# Patient Record
Sex: Male | Born: 1985 | Race: Black or African American | Hispanic: No | Marital: Married | Smoking: Former smoker
Health system: Southern US, Community
[De-identification: ages and names within clinical notes are randomized; demographics above are authoritative.]

## PROBLEM LIST (undated history)

## (undated) ENCOUNTER — Ambulatory Visit (HOSPITAL_COMMUNITY)

## (undated) DIAGNOSIS — E7800 Pure hypercholesterolemia, unspecified: Secondary | ICD-10-CM

## (undated) DIAGNOSIS — E119 Type 2 diabetes mellitus without complications: Secondary | ICD-10-CM

## (undated) DIAGNOSIS — I10 Essential (primary) hypertension: Secondary | ICD-10-CM

## (undated) DIAGNOSIS — K859 Acute pancreatitis without necrosis or infection, unspecified: Secondary | ICD-10-CM

## (undated) HISTORY — PX: WISDOM TOOTH EXTRACTION: SHX21

## (undated) HISTORY — PX: SHOULDER SURGERY: SHX246

## (undated) HISTORY — DX: Acute pancreatitis without necrosis or infection, unspecified: K85.90

## (undated) HISTORY — DX: Type 2 diabetes mellitus without complications: E11.9

## (undated) HISTORY — PX: TONSILLECTOMY: SUR1361

---

## 2017-01-01 DIAGNOSIS — E8881 Metabolic syndrome: Secondary | ICD-10-CM | POA: Insufficient documentation

## 2017-01-05 ENCOUNTER — Encounter (HOSPITAL_COMMUNITY): Payer: Self-pay | Admitting: Emergency Medicine

## 2017-01-05 ENCOUNTER — Other Ambulatory Visit: Payer: Self-pay

## 2017-01-05 DIAGNOSIS — R197 Diarrhea, unspecified: Secondary | ICD-10-CM | POA: Diagnosis not present

## 2017-01-05 DIAGNOSIS — R110 Nausea: Secondary | ICD-10-CM | POA: Diagnosis not present

## 2017-01-05 DIAGNOSIS — R6883 Chills (without fever): Secondary | ICD-10-CM | POA: Diagnosis not present

## 2017-01-05 DIAGNOSIS — I10 Essential (primary) hypertension: Secondary | ICD-10-CM | POA: Diagnosis not present

## 2017-01-05 DIAGNOSIS — R61 Generalized hyperhidrosis: Secondary | ICD-10-CM | POA: Insufficient documentation

## 2017-01-05 DIAGNOSIS — R945 Abnormal results of liver function studies: Secondary | ICD-10-CM | POA: Insufficient documentation

## 2017-01-05 DIAGNOSIS — R1033 Periumbilical pain: Secondary | ICD-10-CM | POA: Insufficient documentation

## 2017-01-05 LAB — URINALYSIS, ROUTINE W REFLEX MICROSCOPIC
BACTERIA UA: NONE SEEN
BILIRUBIN URINE: NEGATIVE
Glucose, UA: NEGATIVE mg/dL
KETONES UR: NEGATIVE mg/dL
LEUKOCYTES UA: NEGATIVE
Nitrite: NEGATIVE
PH: 5 (ref 5.0–8.0)
Protein, ur: NEGATIVE mg/dL
SPECIFIC GRAVITY, URINE: 1.024 (ref 1.005–1.030)
SQUAMOUS EPITHELIAL / LPF: NONE SEEN

## 2017-01-05 LAB — CBC
HCT: 42.7 % (ref 39.0–52.0)
HEMOGLOBIN: 14.4 g/dL (ref 13.0–17.0)
MCH: 29.2 pg (ref 26.0–34.0)
MCHC: 33.7 g/dL (ref 30.0–36.0)
MCV: 86.6 fL (ref 78.0–100.0)
Platelets: 265 10*3/uL (ref 150–400)
RBC: 4.93 MIL/uL (ref 4.22–5.81)
RDW: 13.9 % (ref 11.5–15.5)
WBC: 6.7 10*3/uL (ref 4.0–10.5)

## 2017-01-05 NOTE — ED Triage Notes (Addendum)
Pt presents to ED for assessment of mid abdominal pain, no radiation.  States it "feels like the outer lining of my stomach".  Pt denies pattern to pain, but it comes and goes.  Pt states pain started yesterday morning, c/o nausea, denies vomiting or diarrhea.  Pt states he has also been eating a lot of Romaine lettuce, and is concerned for infection.

## 2017-01-06 ENCOUNTER — Emergency Department (HOSPITAL_COMMUNITY)
Admission: EM | Admit: 2017-01-06 | Discharge: 2017-01-06 | Disposition: A | Discharge status: Home / Self Care | Payer: 59 | Attending: Emergency Medicine | Admitting: Emergency Medicine

## 2017-01-06 ENCOUNTER — Emergency Department (HOSPITAL_COMMUNITY): Payer: 59

## 2017-01-06 DIAGNOSIS — R7989 Other specified abnormal findings of blood chemistry: Secondary | ICD-10-CM

## 2017-01-06 DIAGNOSIS — R945 Abnormal results of liver function studies: Secondary | ICD-10-CM

## 2017-01-06 DIAGNOSIS — R1033 Periumbilical pain: Secondary | ICD-10-CM

## 2017-01-06 HISTORY — DX: Pure hypercholesterolemia, unspecified: E78.00

## 2017-01-06 HISTORY — DX: Essential (primary) hypertension: I10

## 2017-01-06 LAB — COMPREHENSIVE METABOLIC PANEL
ALBUMIN: 4.3 g/dL (ref 3.5–5.0)
ALK PHOS: 98 U/L (ref 38–126)
ALT: 67 U/L — AB (ref 17–63)
AST: 61 U/L — AB (ref 15–41)
Anion gap: 7 (ref 5–15)
BILIRUBIN TOTAL: 0.8 mg/dL (ref 0.3–1.2)
BUN: 12 mg/dL (ref 6–20)
CALCIUM: 9.4 mg/dL (ref 8.9–10.3)
CO2: 25 mmol/L (ref 22–32)
Chloride: 103 mmol/L (ref 101–111)
Creatinine, Ser: 1 mg/dL (ref 0.61–1.24)
GFR calc Af Amer: >60 mL/min (ref >60)
GFR calc non Af Amer: >60 mL/min (ref >60)
GLUCOSE: 133 mg/dL — AB (ref 65–99)
Potassium: 3.6 mmol/L (ref 3.5–5.1)
SODIUM: 135 mmol/L (ref 135–145)
TOTAL PROTEIN: 7.9 g/dL (ref 6.5–8.1)

## 2017-01-06 LAB — ACETAMINOPHEN LEVEL

## 2017-01-06 LAB — LIPASE, BLOOD: Lipase: 26 U/L (ref 11–51)

## 2017-01-06 MED ORDER — ONDANSETRON HCL 4 MG/2ML IJ SOLN
4.0000 mg | Freq: Once | INTRAMUSCULAR | Status: AC
  Administered 2017-01-06: 4 mg via INTRAVENOUS
  Filled 2017-01-06: qty 2

## 2017-01-06 MED ORDER — IOPAMIDOL (ISOVUE-300) INJECTION 61%
INTRAVENOUS | Status: AC
  Administered 2017-01-06: 100 mL
  Filled 2017-01-06: qty 100

## 2017-01-06 MED ORDER — DICYCLOMINE HCL 20 MG PO TABS: 20.0000 mg | ORAL_TABLET | Freq: Two times a day (BID) | ORAL | 0 refills | Status: DC

## 2017-01-06 MED ORDER — FENTANYL CITRATE (PF) 100 MCG/2ML IJ SOLN
50.0000 ug | Freq: Once | INTRAMUSCULAR | Status: AC
  Administered 2017-01-06: 50 ug via INTRAVENOUS
  Filled 2017-01-06: qty 2

## 2017-01-06 MED ORDER — ONDANSETRON 4 MG PO TBDP: 4.0000 mg | ORAL_TABLET | Freq: Three times a day (TID) | ORAL | 0 refills | Status: DC | PRN

## 2017-01-06 NOTE — ED Provider Notes (Signed)
[facility] EMERGENCY [facility]PARTMENT Provider Note   CSN: [id number] Arrival date & time: 01/05/17  2310     History   Chief Complaint Chief Complaint  Patient presents with  . Abdominal Pain    HPI Douglas Angela NevinBurke Asbridge Jr. is a 31 y.o. male.  Patient presents with a 2-day history of mid abdominal pain with nausea and decreased appetite.  Has had intermittent diarrhea as well.  No vomiting.  No chest pain or shortness of breath.  Feels like he may have had a fever yesterday because he was having chills and sweating but did not check his temperature.  Appetite has increased today and patient has had no vomiting but nausea has improved.  Has intermittent mid abdominal cramping that comes and goes.  Lasts for several minutes to hours at a time.  One episode of diarrhea yesterday.  Denies any pain with urination or hematuria.  No testicular pain.  No sick contacts or recent travel.  No recent antibiotic use.   The history is provided by the patient.  Abdominal Pain   Associated symptoms include diarrhea and nausea. Pertinent negatives include fever, vomiting, dysuria, hematuria, headaches, arthralgias and myalgias.    Past Medical History:  Diagnosis Date  . Hypercholesteremia   . Hypertension   . Prediabetes     There are no active problems to display for this patient.   Past Surgical History:  Procedure Laterality Date  . TONSILLECTOMY    . WISDOM TOOTH EXTRACTION         Home Medications    Prior to Admission medications   Not on File    Family History History reviewed. No pertinent family history.  Social History Social History   Tobacco Use  . Smoking status: Never Smoker  . Smokeless tobacco: Never Used  Substance Use Topics  . Alcohol use: No    Frequency: Never  . Drug use: No     Allergies   Patient has no allergy information on record.   Review of Systems Review of Systems  Constitutional: Positive for activity change,  appetite change, chills and diaphoresis. Negative for fatigue and fever.  HENT: Negative for congestion and rhinorrhea.   Respiratory: Negative for cough, chest tightness and shortness of breath.   Cardiovascular: Negative for chest pain.  Gastrointestinal: Positive for abdominal pain, diarrhea and nausea. Negative for vomiting.  Genitourinary: Negative for dysuria, hematuria and testicular pain.  Musculoskeletal: Negative for arthralgias and myalgias.  Skin: Negative for wound.  Neurological: Negative for dizziness, weakness and headaches.   all other systems are negative except as noted in the HPI and PMH.     Physical Exam Updated Vital Signs BP 134/89 (BP Location: Right Arm)   Pulse 91   Temp 98.7 F (37.1 C) (Oral)   Resp 18   Ht 6' (1.829 m)   Wt (!) 147.9 kg (326 lb)   SpO2 100%   BMI 44.21 kg/m   Physical Exam  Constitutional: He is oriented to person, place, and time. He appears well-developed and well-nourished. No distress.  HENT:  Head: Normocephalic and atraumatic.  Mouth/Throat: Oropharynx is clear and moist. No oropharyngeal exudate.  Eyes: Conjunctivae and EOM are normal. Pupils are equal, round, and reactive to light.  Neck: Normal range of motion. Neck supple.  No meningismus.  Cardiovascular: Normal rate, regular rhythm, normal heart sounds and intact distal pulses.  No murmur heard. Pulmonary/Chest: Effort normal and breath sounds normal. No respiratory distress.  Abdominal: Soft. There  is tenderness. There is no rebound and no guarding.  Obese Moderate TTP RLQ and periumbilical. No guarding or rebound. Upper abdomen soft and nontender  Musculoskeletal: Normal range of motion. He exhibits no edema or tenderness.  Neurological: He is alert and oriented to person, place, and time. No cranial nerve deficit. He exhibits normal muscle tone. Coordination normal.  No ataxia on finger to nose bilaterally. No pronator drift. 5/5 strength throughout. CN 2-12  intact.Equal grip strength. Sensation intact.   Skin: Skin is warm.  Psychiatric: He has a normal mood and affect. His behavior is normal.  Nursing note and vitals reviewed.    ED Treatments / Results  Labs (all labs ordered are listed, but only abnormal results are displayed) Labs Reviewed  COMPREHENSIVE METABOLIC PANEL - Abnormal; Notable for the following components:      Result Value   Glucose, Bld 133 (*)    AST 61 (*)    ALT 67 (*)    All other components within normal limits  URINALYSIS, ROUTINE W REFLEX MICROSCOPIC - Abnormal; Notable for the following components:   Hgb urine dipstick SMALL (*)    All other components within normal limits  ACETAMINOPHEN LEVEL - Abnormal; Notable for the following components:   Acetaminophen (Tylenol), Serum <10 (*)    All other components within normal limits  LIPASE, BLOOD  CBC  HEPATITIS PANEL, ACUTE    EKG  EKG Interpretation None       Radiology Ct Abdomen Pelvis W Contrast  Result Date: 01/06/2017 CLINICAL DATA:  31 year old male with mid abdominal pain. EXAM: CT ABDOMEN AND PELVIS WITH CONTRAST TECHNIQUE: Multidetector CT imaging of the abdomen and pelvis was performed using the standard protocol following bolus administration of intravenous contrast. CONTRAST:  ISOVUE-300 IOPAMIDOL (ISOVUE-300) INJECTION 61% COMPARISON:  Abdominal ultrasound dated 01/06/2017 FINDINGS: Lower chest: The visualized lung bases are clear. No intra-abdominal free air or free fluid. Hepatobiliary: There is diffuse fatty infiltration of the liver. No intrahepatic biliary ductal dilatation. The gallbladder is unremarkable. Pancreas: Unremarkable. No pancreatic ductal dilatation or surrounding inflammatory changes. Spleen: Normal in size without focal abnormality. Adrenals/Urinary Tract: Adrenal glands are unremarkable. Kidneys are normal, without renal calculi, focal lesion, or hydronephrosis. Bladder is unremarkable. Stomach/Bowel: There is no  evidence of bowel obstruction or active inflammation. Normal caliber fluid-filled loops of small bowel, likely physiologic. There is mild engorgement of the associated mesentery. Correlation with clinical exam is recommended to exclude enteritis. The appendix is normal. Vascular/Lymphatic: The abdominal aorta and IVC appear unremarkable. No portal venous gas. There is mild haziness of the mesentery with multiple top-normal lymph nodes with a "misty mesentery" appearance. This finding is nonspecific but may be related to underlying inflammatory/infectious etiology. No adenopathy. Reproductive: The prostate and seminal vesicles are grossly unremarkable. Other: None Musculoskeletal: Bilateral L5 pars defects. No listhesis. The osseous structures are otherwise intact. IMPRESSION: 1. Normal caliber fluid-filled loops of small bowel, likely physiologic. Clinical correlation is recommended to exclude enteritis. No bowel obstruction. Normal appendix. 2. Nonspecific mesenteric haziness with small shotty lymph nodes. 3. Fatty liver. Electronically Signed   By: Elgie Collard M.D.   On: 01/06/2017 03:15   US Abdomen Limited Ruq  Result Date: 01/06/2017 CLINICAL DATA:  Elevated liver function studies. EXAM: ULTRASOUND ABDOMEN LIMITED RIGHT UPPER QUADRANT COMPARISON:  None. FINDINGS: Gallbladder: No gallstones or wall thickening visualized. No sonographic Murphy sign noted by sonographer. Common bile duct: Diameter: 3.1 cm, normal Liver: Diffusely increased hepatic parenchymal echotexture likely representing fatty infiltration.  No focal lesions identified but could be obscured by the parenchymal process. Portal vein is patent on color Doppler imaging with normal direction of blood flow towards the liver. Examination is limited by patient's body habitus. IMPRESSION: No evidence of cholelithiasis or cholecystitis. Diffuse fatty infiltration of the liver. Electronically Signed   By: Burman NievesWilliam  Stevens M.D.   On: 01/06/2017  02:53    Procedures Procedures (including critical care time)  Medications Ordered in ED Medications  fentaNYL (SUBLIMAZE) injection 50 mcg (not administered)  ondansetron (ZOFRAN) injection 4 mg (not administered)     Initial Impression / Assessment and Plan / ED Course  I have reviewed the triage vital signs and the nursing notes.  Pertinent labs & imaging results that were available during my care of the patient were reviewed by me and considered in my medical decision making (see chart for details).    Patient with 2 days of abdominal cramping with nausea.  No vomiting.  Patient is well-appearing, abdomen is soft with mild right sided lower abdominal tenderness.  Labs are reassuring with negative UA.  No leukocytosis.  Minimal elevation of LFTs.  Imaging reassuring which shows normal appendix.  Patient feels improved.  We will treat supportively for likely viral gastrointestinal illness.  Discussed with patient that early appendicitis is possible though appears normal on CT at this time.  Recommend return to the ED if symptoms do not improve, becomes worse especially in the right lower quadrant or any other concerns.  Final Clinical Impressions(s) / ED Diagnoses   Final diagnoses:  Elevated LFTs  Periumbilical abdominal pain    ED Discharge Orders    None       Sheala Dosh, Jeannett SeniorStephen, MD 01/06/17 [phone]

## 2017-01-06 NOTE — Discharge Instructions (Signed)
Your testing today is reassuring.  Take the medications as prescribed.  Return to the ED if your pain becomes worse especially in the right lower quadrant of her abdomen, you develop fever, vomiting or any other concerns.

## 2017-01-06 NOTE — ED Notes (Signed)
Patient transported to Ultrasound 

## 2017-01-07 LAB — HEPATITIS PANEL, ACUTE
HEP B C IGM: NEGATIVE
HEP B S AG: NEGATIVE
Hep A IgM: NEGATIVE

## 2017-08-16 DIAGNOSIS — K859 Acute pancreatitis without necrosis or infection, unspecified: Secondary | ICD-10-CM

## 2017-08-16 HISTORY — DX: Acute pancreatitis without necrosis or infection, unspecified: K85.90

## 2017-09-15 ENCOUNTER — Encounter (HOSPITAL_COMMUNITY): Payer: Self-pay | Admitting: Emergency Medicine

## 2017-09-15 ENCOUNTER — Other Ambulatory Visit: Payer: Self-pay

## 2017-09-15 ENCOUNTER — Inpatient Hospital Stay (HOSPITAL_COMMUNITY)
Admission: EM | Admit: 2017-09-15 | Discharge: 2017-09-22 | DRG: 439 | Disposition: A | Discharge status: Home / Self Care | Payer: 59 | Attending: Internal Medicine | Admitting: Internal Medicine

## 2017-09-15 DIAGNOSIS — I10 Essential (primary) hypertension: Secondary | ICD-10-CM | POA: Diagnosis present

## 2017-09-15 DIAGNOSIS — D72829 Elevated white blood cell count, unspecified: Secondary | ICD-10-CM | POA: Diagnosis present

## 2017-09-15 DIAGNOSIS — K859 Acute pancreatitis without necrosis or infection, unspecified: Secondary | ICD-10-CM | POA: Diagnosis not present

## 2017-09-15 DIAGNOSIS — K8500 Idiopathic acute pancreatitis without necrosis or infection: Secondary | ICD-10-CM

## 2017-09-15 DIAGNOSIS — E7800 Pure hypercholesterolemia, unspecified: Secondary | ICD-10-CM | POA: Diagnosis present

## 2017-09-15 DIAGNOSIS — Z9114 Patient's other noncompliance with medication regimen: Secondary | ICD-10-CM

## 2017-09-15 DIAGNOSIS — E871 Hypo-osmolality and hyponatremia: Secondary | ICD-10-CM | POA: Diagnosis present

## 2017-09-15 DIAGNOSIS — R945 Abnormal results of liver function studies: Secondary | ICD-10-CM | POA: Diagnosis present

## 2017-09-15 DIAGNOSIS — E66813 Obesity, class 3: Secondary | ICD-10-CM | POA: Diagnosis present

## 2017-09-15 DIAGNOSIS — E781 Pure hyperglyceridemia: Secondary | ICD-10-CM | POA: Diagnosis present

## 2017-09-15 DIAGNOSIS — E876 Hypokalemia: Secondary | ICD-10-CM | POA: Diagnosis not present

## 2017-09-15 DIAGNOSIS — R1013 Epigastric pain: Secondary | ICD-10-CM

## 2017-09-15 DIAGNOSIS — R7989 Other specified abnormal findings of blood chemistry: Secondary | ICD-10-CM | POA: Diagnosis present

## 2017-09-15 DIAGNOSIS — R112 Nausea with vomiting, unspecified: Secondary | ICD-10-CM | POA: Diagnosis not present

## 2017-09-15 DIAGNOSIS — R109 Unspecified abdominal pain: Secondary | ICD-10-CM | POA: Diagnosis present

## 2017-09-15 DIAGNOSIS — E119 Type 2 diabetes mellitus without complications: Secondary | ICD-10-CM | POA: Diagnosis present

## 2017-09-15 DIAGNOSIS — Z6841 Body Mass Index (BMI) 40.0 and over, adult: Secondary | ICD-10-CM

## 2017-09-15 LAB — CBC
HEMATOCRIT: 46.3 % (ref 39.0–52.0)
Hemoglobin: 17.2 g/dL — ABNORMAL HIGH (ref 13.0–17.0)
MCH: 30.9 pg (ref 26.0–34.0)
MCHC: 37.1 g/dL — AB (ref 30.0–36.0)
MCV: 83.3 fL (ref 78.0–100.0)
Platelets: 254 10*3/uL (ref 150–400)
RBC: 5.56 MIL/uL (ref 4.22–5.81)
RDW: 13.6 % (ref 11.5–15.5)
WBC: 13.3 10*3/uL — ABNORMAL HIGH (ref 4.0–10.5)

## 2017-09-15 LAB — URINALYSIS, ROUTINE W REFLEX MICROSCOPIC
Bilirubin Urine: NEGATIVE
Glucose, UA: >=500 mg/dL — AB
KETONES UR: 5 mg/dL — AB
Leukocytes, UA: NEGATIVE
Nitrite: NEGATIVE
PROTEIN: 100 mg/dL — AB
Specific Gravity, Urine: 1.025 (ref 1.005–1.030)
pH: 5 (ref 5.0–8.0)

## 2017-09-15 MED ORDER — SODIUM CHLORIDE 0.9 % IV BOLUS (SEPSIS)
1000.0000 mL | Freq: Once | INTRAVENOUS | Status: AC
  Administered 2017-09-15: 1000 mL via INTRAVENOUS

## 2017-09-15 MED ORDER — HYDROMORPHONE HCL 1 MG/ML IJ SOLN
1.0000 mg | Freq: Once | INTRAMUSCULAR | Status: AC
  Administered 2017-09-15: 1 mg via INTRAVENOUS
  Filled 2017-09-15: qty 1

## 2017-09-15 MED ORDER — SODIUM CHLORIDE 0.9 % IV SOLN
1000.0000 mL | INTRAVENOUS | Status: DC
  Administered 2017-09-16: 1000 mL via INTRAVENOUS

## 2017-09-15 MED ORDER — FAMOTIDINE IN NACL 20-0.9 MG/50ML-% IV SOLN
20.0000 mg | Freq: Two times a day (BID) | INTRAVENOUS | Status: DC
  Administered 2017-09-15: 20 mg via INTRAVENOUS
  Filled 2017-09-15: qty 50

## 2017-09-15 MED ORDER — ONDANSETRON 4 MG PO TBDP
4.0000 mg | ORAL_TABLET | Freq: Once | ORAL | Status: AC | PRN
  Administered 2017-09-15: 4 mg via ORAL
  Filled 2017-09-15: qty 1

## 2017-09-15 NOTE — ED Triage Notes (Signed)
Pt complaining of central  abdominal pain along with a burning sensation on right side. Pt also reports nausea. Pt denies taking any medications prior to arrival.

## 2017-09-15 NOTE — ED Provider Notes (Signed)
[facility]-EMERGENCY DEPT Provider Note   CSN: [id number] Arrival date & time: 09/15/17  1927     History   Chief Complaint Chief Complaint  Patient presents with  . Abdominal Pain  . Nausea    HPI Douglas Angela NevinBurke Akter Jr. is a 32 y.o. male.  HPI Patient reports that after lunch he quite quickly developed a lot of pain in his upper abdomen.  He had eaten some stuffed pasta shells he made the day before.  Pain has intense cramping and burning quality.  He reports initially he thought it was just some stomach upset but it quickly got very severe.  He came to the emergency department.  He reports that he vomited and temporarily felt slightly better but the pain quickly returned.  No diarrhea or recent constipation.  No pain burning urgency urination.  No recent fever chills or general illness. Past Medical History:  Diagnosis Date  . Hypercholesteremia   . Hypertension   . Prediabetes     There are no active problems to display for this patient.   Past Surgical History:  Procedure Laterality Date  . TONSILLECTOMY    . WISDOM TOOTH EXTRACTION          Home Medications    Prior to Admission medications   Medication Sig Start Date End Date Taking? Authorizing Provider  fenofibrate 160 MG tablet Take 160 mg by mouth daily. 10/30/16  Yes [provider]  hydrochlorothiazide (HYDRODIURIL) 25 MG tablet Take 25 mg by mouth daily. 10/29/16  Yes [provider]  ibuprofen (ADVIL,MOTRIN) 800 MG tablet Take 800 mg by mouth daily as needed for pain. 09/24/16  Yes [provider]  rosuvastatin (CRESTOR) 20 MG tablet Take 20 mg by mouth daily. 10/30/16  Yes [provider]  dicyclomine (BENTYL) 20 MG tablet Take 1 tablet (20 mg total) by mouth 2 (two) times daily. Patient not taking: Reported on 09/15/2017 01/06/17   Glynn Octaveancour, Stephen, MD  ondansetron (ZOFRAN ODT) 4 MG disintegrating tablet Take 1 tablet (4 mg total) by mouth every 8  (eight) hours as needed for nausea or vomiting. Patient not taking: Reported on 09/15/2017 01/06/17   Glynn Octaveancour, Stephen, MD    Family History No family history on file.  Social History Social History   Tobacco Use  . Smoking status: Never Smoker  . Smokeless tobacco: Never Used  Substance Use Topics  . Alcohol use: No    Frequency: Never  . Drug use: No     Allergies   Patient has no known allergies.   Review of Systems Review of Systems  10 Systems reviewed and are negative for acute change except as noted in the HPI.  Physical Exam Updated Vital Signs BP (!) 166/109   Pulse 100   Temp 98.7 F (37.1 C) (Oral)   Resp (!) 29   Ht 6' (1.829 m)   Wt (!) 149.7 kg (330 lb)   SpO2 96%   BMI 44.76 kg/m   Physical Exam  Constitutional: He is oriented to person, place, and time.  Patient is alert and nontoxic.  No respiratory distress.  He does appear to be in severe pain.  Morbid obesity.  HENT:  Head: Normocephalic and atraumatic.  Mouth/Throat: Oropharynx is clear and moist.  Eyes: EOM are normal.  Neck: Neck supple.  Cardiovascular: Normal rate, regular rhythm, normal heart sounds and intact distal pulses.  Pulmonary/Chest: Effort normal and breath sounds normal.  Abdominal: Soft.  Moderate to severe epigastric  and right upper quadrant pain.  No guarding or rebound.  Musculoskeletal: Normal range of motion. He exhibits no edema or tenderness.  Neurological: He is alert and oriented to person, place, and time. He exhibits normal muscle tone. Coordination normal.  Skin: Skin is warm and dry.  Psychiatric: He has a normal mood and affect.     ED Treatments / Results  Labs (all labs ordered are listed, but only abnormal results are displayed) Labs Reviewed  CBC - Abnormal; Notable for the following components:      Result Value   WBC 13.3 (*)    Hemoglobin 17.2 (*)    MCHC 37.1 (*)    All other components within normal limits  URINALYSIS, ROUTINE W REFLEX  MICROSCOPIC - Abnormal; Notable for the following components:   Glucose, UA >=500 (*)    Hgb urine dipstick SMALL (*)    Ketones, ur 5 (*)    Protein, ur 100 (*)    Bacteria, UA RARE (*)    All other components within normal limits  LIPASE, BLOOD  COMPREHENSIVE METABOLIC PANEL    EKG None  Radiology No results found.  Procedures Procedures (including critical care time)  Medications Ordered in ED Medications  sodium chloride 0.9 % bolus 1,000 mL (1,000 mLs Intravenous New Bag/Given 09/15/17 2311)    Followed by  0.9 %  sodium chloride infusion (1,000 mLs Intravenous New Bag/Given 09/16/17 0016)  famotidine (PEPCID) IVPB 20 mg premix (0 mg Intravenous Stopped 09/15/17 2350)  ondansetron (ZOFRAN-ODT) disintegrating tablet 4 mg (4 mg Oral Given 09/15/17 2012)  HYDROmorphone (DILAUDID) injection 1 mg (1 mg Intravenous Given 09/15/17 2308)     Initial Impression / Assessment and Plan / ED Course  I have reviewed the triage vital signs and the nursing notes.  Pertinent labs & imaging results that were available during my care of the patient were reviewed by me and considered in my medical decision making (see chart for details).      Final Clinical Impressions(s) / ED Diagnoses   Final diagnoses:  Epigastric pain  Patient with fairly severe and acute onset of upper abdominal pain after eating this afternoon.  He reports he felt fine this morning when he got up.  Patient does have mild leukocytosis.  Pain and history are highly suggestive of gallbladder disease.  Will proceed with pain control, fluids and CT scan.  Final disposition pending results of CT scan and response to treatment.  Reviewed with Dr. Bebe Shaggy for follow-up of diagnostic testing and definitive disposition.  ED Discharge Orders    None       Douglas Barrette, MD 09/16/17 0020

## 2017-09-16 ENCOUNTER — Encounter (HOSPITAL_COMMUNITY): Payer: Self-pay

## 2017-09-16 ENCOUNTER — Emergency Department (HOSPITAL_COMMUNITY): Payer: 59

## 2017-09-16 ENCOUNTER — Observation Stay (HOSPITAL_COMMUNITY): Payer: 59

## 2017-09-16 DIAGNOSIS — R945 Abnormal results of liver function studies: Secondary | ICD-10-CM | POA: Diagnosis not present

## 2017-09-16 DIAGNOSIS — K859 Acute pancreatitis without necrosis or infection, unspecified: Secondary | ICD-10-CM | POA: Diagnosis present

## 2017-09-16 DIAGNOSIS — E7800 Pure hypercholesterolemia, unspecified: Secondary | ICD-10-CM | POA: Diagnosis present

## 2017-09-16 DIAGNOSIS — E781 Pure hyperglyceridemia: Secondary | ICD-10-CM | POA: Diagnosis present

## 2017-09-16 DIAGNOSIS — D72829 Elevated white blood cell count, unspecified: Secondary | ICD-10-CM | POA: Diagnosis present

## 2017-09-16 DIAGNOSIS — K858 Other acute pancreatitis without necrosis or infection: Secondary | ICD-10-CM | POA: Diagnosis not present

## 2017-09-16 DIAGNOSIS — R112 Nausea with vomiting, unspecified: Secondary | ICD-10-CM | POA: Diagnosis present

## 2017-09-16 DIAGNOSIS — E871 Hypo-osmolality and hyponatremia: Secondary | ICD-10-CM | POA: Diagnosis present

## 2017-09-16 DIAGNOSIS — Z6841 Body Mass Index (BMI) 40.0 and over, adult: Secondary | ICD-10-CM | POA: Diagnosis not present

## 2017-09-16 DIAGNOSIS — K851 Biliary acute pancreatitis without necrosis or infection: Secondary | ICD-10-CM

## 2017-09-16 DIAGNOSIS — R7989 Other specified abnormal findings of blood chemistry: Secondary | ICD-10-CM | POA: Diagnosis present

## 2017-09-16 DIAGNOSIS — I10 Essential (primary) hypertension: Secondary | ICD-10-CM | POA: Diagnosis present

## 2017-09-16 DIAGNOSIS — E119 Type 2 diabetes mellitus without complications: Secondary | ICD-10-CM | POA: Diagnosis present

## 2017-09-16 DIAGNOSIS — R109 Unspecified abdominal pain: Secondary | ICD-10-CM | POA: Diagnosis present

## 2017-09-16 DIAGNOSIS — E876 Hypokalemia: Secondary | ICD-10-CM | POA: Diagnosis not present

## 2017-09-16 DIAGNOSIS — Z9114 Patient's other noncompliance with medication regimen: Secondary | ICD-10-CM | POA: Diagnosis not present

## 2017-09-16 LAB — COMPREHENSIVE METABOLIC PANEL
ALBUMIN: 4.2 g/dL (ref 3.5–5.0)
ALK PHOS: 81 U/L (ref 38–126)
ALT: 72 U/L — ABNORMAL HIGH (ref 0–44)
ALT: 64 U/L — AB (ref 0–44)
ANION GAP: 14 (ref 5–15)
AST: 82 U/L — ABNORMAL HIGH (ref 15–41)
AST: 61 U/L — ABNORMAL HIGH (ref 15–41)
Albumin: 4.6 g/dL (ref 3.5–5.0)
Alkaline Phosphatase: 128 U/L — ABNORMAL HIGH (ref 38–126)
Anion gap: 12 (ref 5–15)
BUN: 14 mg/dL (ref 6–20)
BUN: 7 mg/dL (ref 6–20)
CALCIUM: 8.4 mg/dL — AB (ref 8.9–10.3)
CHLORIDE: 103 mmol/L (ref 98–111)
CHLORIDE: 102 mmol/L (ref 98–111)
CO2: 18 mmol/L — AB (ref 22–32)
CO2: 18 mmol/L — AB (ref 22–32)
CREATININE: 0.94 mg/dL (ref 0.61–1.24)
Calcium: 9.3 mg/dL (ref 8.9–10.3)
Creatinine, Ser: 0.96 mg/dL (ref 0.61–1.24)
GFR calc Af Amer: >60 mL/min (ref >60)
GFR calc non Af Amer: >60 mL/min (ref >60)
GFR calc non Af Amer: >60 mL/min (ref >60)
GLUCOSE: 247 mg/dL — AB (ref 70–99)
Glucose, Bld: 216 mg/dL — ABNORMAL HIGH (ref 70–99)
POTASSIUM: 4.8 mmol/L (ref 3.5–5.1)
Potassium: 4.5 mmol/L (ref 3.5–5.1)
SODIUM: 133 mmol/L — AB (ref 135–145)
Sodium: 134 mmol/L — ABNORMAL LOW (ref 135–145)
Total Bilirubin: 2.3 mg/dL — ABNORMAL HIGH (ref 0.3–1.2)
Total Bilirubin: 1.7 mg/dL — ABNORMAL HIGH (ref 0.3–1.2)
Total Protein: 8 g/dL (ref 6.5–8.1)
Total Protein: 7.3 g/dL (ref 6.5–8.1)

## 2017-09-16 LAB — GLUCOSE, CAPILLARY
GLUCOSE-CAPILLARY: 193 mg/dL — AB (ref 70–99)
GLUCOSE-CAPILLARY: 233 mg/dL — AB (ref 70–99)
GLUCOSE-CAPILLARY: 235 mg/dL — AB (ref 70–99)
GLUCOSE-CAPILLARY: 237 mg/dL — AB (ref 70–99)
GLUCOSE-CAPILLARY: 244 mg/dL — AB (ref 70–99)
GLUCOSE-CAPILLARY: 245 mg/dL — AB (ref 70–99)
GLUCOSE-CAPILLARY: 255 mg/dL — AB (ref 70–99)
GLUCOSE-CAPILLARY: 272 mg/dL — AB (ref 70–99)
GLUCOSE-CAPILLARY: 272 mg/dL — AB (ref 70–99)
Glucose-Capillary: 238 mg/dL — ABNORMAL HIGH (ref 70–99)
Glucose-Capillary: 254 mg/dL — ABNORMAL HIGH (ref 70–99)
Glucose-Capillary: 278 mg/dL — ABNORMAL HIGH (ref 70–99)
Glucose-Capillary: 291 mg/dL — ABNORMAL HIGH (ref 70–99)
Glucose-Capillary: 273 mg/dL — ABNORMAL HIGH (ref 70–99)
Glucose-Capillary: 260 mg/dL — ABNORMAL HIGH (ref 70–99)
Glucose-Capillary: 204 mg/dL — ABNORMAL HIGH (ref 70–99)
Glucose-Capillary: 192 mg/dL — ABNORMAL HIGH (ref 70–99)

## 2017-09-16 LAB — BASIC METABOLIC PANEL
ANION GAP: 14 (ref 5–15)
ANION GAP: 14 (ref 5–15)
BUN: 6 mg/dL (ref 6–20)
BUN: 5 mg/dL — AB (ref 6–20)
CO2: 18 mmol/L — AB (ref 22–32)
CO2: 19 mmol/L — ABNORMAL LOW (ref 22–32)
Calcium: 8 mg/dL — ABNORMAL LOW (ref 8.9–10.3)
Calcium: 8 mg/dL — ABNORMAL LOW (ref 8.9–10.3)
Chloride: 103 mmol/L (ref 98–111)
Chloride: 102 mmol/L (ref 98–111)
Creatinine, Ser: 0.86 mg/dL (ref 0.61–1.24)
Creatinine, Ser: 0.98 mg/dL (ref 0.61–1.24)
GFR calc Af Amer: >60 mL/min (ref >60)
GFR calc Af Amer: >60 mL/min (ref >60)
GFR calc non Af Amer: >60 mL/min (ref >60)
GLUCOSE: 278 mg/dL — AB (ref 70–99)
GLUCOSE: 260 mg/dL — AB (ref 70–99)
POTASSIUM: 3.9 mmol/L (ref 3.5–5.1)
POTASSIUM: 3.4 mmol/L — AB (ref 3.5–5.1)
Sodium: 135 mmol/L (ref 135–145)
Sodium: 135 mmol/L (ref 135–145)

## 2017-09-16 LAB — LIPASE, BLOOD: LIPASE: 145 U/L — AB (ref 11–51)

## 2017-09-16 LAB — LIPID PANEL
CHOLESTEROL: 777 mg/dL — AB (ref 0–200)
LDL CALC: UNDETERMINED mg/dL (ref 0–99)
VLDL: UNDETERMINED mg/dL (ref 0–40)

## 2017-09-16 LAB — CBC
HEMATOCRIT: 42 % (ref 39.0–52.0)
Hemoglobin: 15.1 g/dL (ref 13.0–17.0)
MCH: 30.4 pg (ref 26.0–34.0)
MCHC: 36 g/dL (ref 30.0–36.0)
MCV: 84.7 fL (ref 78.0–100.0)
PLATELETS: 240 10*3/uL (ref 150–400)
RBC: 4.96 MIL/uL (ref 4.22–5.81)
RDW: 13.8 % (ref 11.5–15.5)
WBC: 12.6 10*3/uL — AB (ref 4.0–10.5)

## 2017-09-16 LAB — MRSA PCR SCREENING: MRSA by PCR: NEGATIVE

## 2017-09-16 LAB — HIV ANTIBODY (ROUTINE TESTING W REFLEX): HIV Screen 4th Generation wRfx: NONREACTIVE

## 2017-09-16 LAB — TRIGLYCERIDES: Triglycerides: 4960 mg/dL — ABNORMAL HIGH (ref <150)

## 2017-09-16 LAB — HEMOGLOBIN A1C
Hgb A1c MFr Bld: 8.9 % — ABNORMAL HIGH (ref 4.8–5.6)
Mean Plasma Glucose: 208.73 mg/dL

## 2017-09-16 MED ORDER — DEXTROSE-NACL 5-0.45 % IV SOLN: INTRAVENOUS | Status: DC

## 2017-09-16 MED ORDER — IOPAMIDOL (ISOVUE-300) INJECTION 61%
INTRAVENOUS | Status: AC
  Administered 2017-09-16: 100 mL via INTRAVENOUS
  Filled 2017-09-16: qty 100

## 2017-09-16 MED ORDER — HYDROMORPHONE HCL 1 MG/ML IJ SOLN
1.0000 mg | INTRAMUSCULAR | Status: DC | PRN
  Administered 2017-09-16 – 2017-09-19 (×28): 1 mg via INTRAVENOUS
  Filled 2017-09-16 (×27): qty 1

## 2017-09-16 MED ORDER — SODIUM CHLORIDE 0.9 % IV SOLN
INTRAVENOUS | Status: DC
  Administered 2017-09-16: 08:00:00 via INTRAVENOUS

## 2017-09-16 MED ORDER — FENOFIBRATE 160 MG PO TABS
160.0000 mg | ORAL_TABLET | Freq: Every day | ORAL | Status: DC
  Administered 2017-09-16 – 2017-09-22 (×7): 160 mg via ORAL
  Filled 2017-09-16 (×7): qty 1

## 2017-09-16 MED ORDER — METOPROLOL TARTRATE 5 MG/5ML IV SOLN
5.0000 mg | INTRAVENOUS | Status: DC | PRN
  Administered 2017-09-16 (×2): 5 mg via INTRAVENOUS
  Filled 2017-09-16 (×2): qty 5

## 2017-09-16 MED ORDER — IOPAMIDOL (ISOVUE-300) INJECTION 61%
100.0000 mL | Freq: Once | INTRAVENOUS | Status: AC | PRN
  Administered 2017-09-16: 100 mL via INTRAVENOUS

## 2017-09-16 MED ORDER — SODIUM CHLORIDE 0.9 % IV SOLN
15.0000 [IU]/h | INTRAVENOUS | Status: DC
  Administered 2017-09-16 – 2017-09-18 (×9): 15 [IU]/h via INTRAVENOUS
  Administered 2017-09-19 (×2): 10 [IU]/h via INTRAVENOUS
  Administered 2017-09-20 (×2): 15 [IU]/h via INTRAVENOUS
  Filled 2017-09-16 (×22): qty 1

## 2017-09-16 MED ORDER — HYDROMORPHONE HCL 1 MG/ML IJ SOLN
INTRAMUSCULAR | Status: AC
  Filled 2017-09-16: qty 1

## 2017-09-16 MED ORDER — ENOXAPARIN SODIUM 60 MG/0.6ML ~~LOC~~ SOLN
60.0000 mg | SUBCUTANEOUS | Status: DC
  Administered 2017-09-16 – 2017-09-22 (×7): 60 mg via SUBCUTANEOUS
  Filled 2017-09-16 (×8): qty 0.6

## 2017-09-16 MED ORDER — SODIUM CHLORIDE 0.9 % IV SOLN
INTRAVENOUS | Status: DC | PRN
  Administered 2017-09-16 – 2017-09-20 (×3): 250 mL via INTRAVENOUS

## 2017-09-16 MED ORDER — ONDANSETRON HCL 4 MG PO TABS: 4.0000 mg | ORAL_TABLET | Freq: Four times a day (QID) | ORAL | Status: DC | PRN

## 2017-09-16 MED ORDER — HYDRALAZINE HCL 20 MG/ML IJ SOLN
10.0000 mg | Freq: Four times a day (QID) | INTRAMUSCULAR | Status: DC | PRN
  Administered 2017-09-16: 10 mg via INTRAVENOUS

## 2017-09-16 MED ORDER — ONDANSETRON HCL 4 MG/2ML IJ SOLN
4.0000 mg | Freq: Four times a day (QID) | INTRAMUSCULAR | Status: DC | PRN
  Administered 2017-09-16 (×2): 4 mg via INTRAVENOUS
  Filled 2017-09-16 (×2): qty 2

## 2017-09-16 MED ORDER — DEXTROSE 10 % IV SOLN
INTRAVENOUS | Status: DC
  Administered 2017-09-16: 1000 mL via INTRAVENOUS
  Administered 2017-09-16 – 2017-09-17 (×2): via INTRAVENOUS
  Filled 2017-09-16 (×3): qty 1000

## 2017-09-16 MED ORDER — OXYCODONE HCL 5 MG PO TABS
5.0000 mg | ORAL_TABLET | ORAL | Status: DC | PRN
  Administered 2017-09-16 (×2): 5 mg via ORAL
  Filled 2017-09-16 (×2): qty 1

## 2017-09-16 MED ORDER — IBUPROFEN 200 MG PO TABS
400.0000 mg | ORAL_TABLET | Freq: Once | ORAL | Status: AC
  Administered 2017-09-16: 400 mg via ORAL
  Filled 2017-09-16: qty 2

## 2017-09-16 MED ORDER — INSULIN REGULAR HUMAN 100 UNIT/ML IJ SOLN: INTRAMUSCULAR | Status: DC

## 2017-09-16 MED ORDER — DEXTROSE 50 % IV SOLN
25.0000 mL | INTRAVENOUS | Status: DC | PRN
  Administered 2017-09-18: 50 mL via INTRAVENOUS
  Filled 2017-09-16: qty 50

## 2017-09-16 MED ORDER — ATORVASTATIN CALCIUM 40 MG PO TABS
40.0000 mg | ORAL_TABLET | Freq: Every day | ORAL | Status: DC
  Administered 2017-09-16 – 2017-09-21 (×6): 40 mg via ORAL
  Filled 2017-09-16 (×6): qty 1

## 2017-09-16 MED ORDER — HYDRALAZINE HCL 20 MG/ML IJ SOLN
5.0000 mg | Freq: Four times a day (QID) | INTRAMUSCULAR | Status: DC | PRN
  Administered 2017-09-16: 5 mg via INTRAVENOUS
  Filled 2017-09-16 (×2): qty 1

## 2017-09-16 MED ORDER — FAMOTIDINE IN NACL 20-0.9 MG/50ML-% IV SOLN
20.0000 mg | Freq: Every day | INTRAVENOUS | Status: DC
  Administered 2017-09-16: 20 mg via INTRAVENOUS
  Filled 2017-09-16: qty 50

## 2017-09-16 MED ORDER — ACETAMINOPHEN 325 MG PO TABS
650.0000 mg | ORAL_TABLET | Freq: Four times a day (QID) | ORAL | Status: DC | PRN
  Administered 2017-09-16 – 2017-09-21 (×10): 650 mg via ORAL
  Filled 2017-09-16 (×10): qty 2

## 2017-09-16 MED ORDER — HYDROMORPHONE HCL 1 MG/ML IJ SOLN
1.0000 mg | Freq: Once | INTRAMUSCULAR | Status: AC
  Administered 2017-09-16: 1 mg via INTRAVENOUS

## 2017-09-16 MED ORDER — HYDROMORPHONE HCL 1 MG/ML IJ SOLN
1.0000 mg | INTRAMUSCULAR | Status: DC | PRN
  Administered 2017-09-16: 1 mg via INTRAVENOUS
  Filled 2017-09-16: qty 1

## 2017-09-16 MED ORDER — SENNOSIDES-DOCUSATE SODIUM 8.6-50 MG PO TABS: 1.0000 | ORAL_TABLET | Freq: Every evening | ORAL | Status: DC | PRN

## 2017-09-16 MED ORDER — HYDROMORPHONE HCL 1 MG/ML IJ SOLN
1.0000 mg | Freq: Once | INTRAMUSCULAR | Status: AC
  Administered 2017-09-16: 1 mg via INTRAVENOUS
  Filled 2017-09-16: qty 1

## 2017-09-16 NOTE — ED Notes (Signed)
ED TO INPATIENT HANDOFF REPORT  Name/Age/Gender Douglas [name]. 32 y.o. male  Code Status    Code Status Orders  (From admission, onward)        Start     Ordered   09/16/17 0256  Full code  Continuous     09/16/17 0255    Code Status History    This patient has a current code status but no historical code status.      Home/SNF/Other Home  Chief Complaint Abdominal Pain; Nausea  Level of Care/Admitting Diagnosis ED Disposition    ED Disposition Condition [place] [facility] Area: [facility] [[id number]]  Level of Care: Med-Surg [16]  Diagnosis: Pancreatitis [202663]  Admitting Physician: [facility], [facility] [[id number]]  Attending Physician: Hollice Gong, [facility] [[id number]]  PT Class (Do Not Modify): Observation [104]  PT Acc Code (Do Not Modify): Observation [10022]       Medical History Past Medical History:  Diagnosis Date  . Hypercholesteremia   . Hypertension   . Prediabetes     Allergies No Known Allergies  IV Location/Drains/Wounds Patient Lines/Drains/Airways Status   Active Line/Drains/Airways    Name:   Placement date:   Placement time:   Site:   Days:   Peripheral IV 09/16/17 Right Hand   09/16/17    0104    Hand   less than 1          Labs/Imaging Results for orders placed or performed during the hospital encounter of 09/15/17 (from the past 48 hour(s))  Lipase, blood     Status: Abnormal   Collection Time: 09/15/17  8:20 PM  Result Value Ref Range   Lipase 145 (H) 11 - 51 U/L    Comment: Performed at [facility] [address], [place], [place] [address]  Comprehensive metabolic panel     Status: Abnormal   Collection Time: 09/15/17  8:20 PM  Result Value Ref Range   Sodium 133 (L) 135 - 145 mmol/L    Comment: POST-ULTRACENTRIFUGATION   Potassium 4.8 3.5 - 5.1 mmol/L    Comment: SPECIMEN HEMOLYZED. HEMOLYSIS MAY AFFECT INTEGRITY OF RESULTS.   Chloride 103 98 - 111 mmol/L   CO2 18 (L)  22 - 32 mmol/L   Glucose, Bld 216 (H) 70 - 99 mg/dL   BUN 14 6 - 20 mg/dL   Creatinine, Ser 0.96 0.61 - 1.24 mg/dL   Calcium 9.3 8.9 - 10.3 mg/dL   Total Protein 8.0 6.5 - 8.1 g/dL   Albumin 4.6 3.5 - 5.0 g/dL   AST 82 (H) 15 - 41 U/L   ALT 72 (H) 0 - 44 U/L   Alkaline Phosphatase 128 (H) 38 - 126 U/L   Total Bilirubin 2.3 (H) 0.3 - 1.2 mg/dL   GFR calc non Af Amer >60 >60 mL/min   GFR calc Af Amer >60 >60 mL/min    Comment: (NOTE) The eGFR has been calculated using the CKD EPI equation. This calculation has not been validated in all clinical situations. eGFR's persistently <60 mL/min signify possible Chronic Kidney Disease.    Anion gap 12 5 - 15    Comment: Performed at [facility] [address], [place] [address]  CBC     Status: Abnormal   Collection Time: 09/15/17  8:20 PM  Result Value Ref Range   WBC 13.3 (H) 4.0 - 10.5 K/uL   RBC 5.56 4.22 - 5.81 MIL/uL   Hemoglobin 17.2 (  H) 13.0 - 17.0 g/dL   HCT 46.3 39.0 - 52.0 %   MCV 83.3 78.0 - 100.0 fL   MCH 30.9 26.0 - 34.0 pg   MCHC 37.1 (H) 30.0 - 36.0 g/dL    Comment: CORRECTED FOR LIPEMIA   RDW 13.6 11.5 - 15.5 %   Platelets 254 150 - 400 K/uL    Comment: Performed at [facility], [place] [address] [place], [place] [address]  Urinalysis, Routine w reflex microscopic     Status: Abnormal   Collection Time: 09/15/17  8:20 PM  Result Value Ref Range   Color, Urine YELLOW YELLOW   APPearance CLEAR CLEAR   Specific Gravity, Urine 1.025 1.005 - 1.030   pH 5.0 5.0 - 8.0   Glucose, UA >=500 (A) NEGATIVE mg/dL   Hgb urine dipstick SMALL (A) NEGATIVE   Bilirubin Urine NEGATIVE NEGATIVE   Ketones, ur 5 (A) NEGATIVE mg/dL   Protein, ur 100 (A) NEGATIVE mg/dL   Nitrite NEGATIVE NEGATIVE   Leukocytes, UA NEGATIVE NEGATIVE   RBC / HPF 0-5 0 - 5 RBC/hpf   WBC, UA 0-5 0 - 5 WBC/hpf   Bacteria, UA RARE (A) NONE SEEN   Squamous Epithelial / LPF 0-5 0 - 5   Mucus PRESENT    Hyaline Casts, UA  PRESENT     Comment: Performed at [facility], [place] [address] [place], [place] [address]   Ct Abdomen Pelvis W Contrast  Result Date: 09/16/2017 CLINICAL DATA:  Abdominal pain, nausea, leukocytosis, elevated lipase, microhematuria EXAM: CT ABDOMEN AND PELVIS WITH CONTRAST TECHNIQUE: Multidetector CT imaging of the abdomen and pelvis was performed using the standard protocol following bolus administration of intravenous contrast. CONTRAST:  170m ISOVUE-300 IOPAMIDOL (ISOVUE-300) INJECTION 61% COMPARISON:  01/06/2017 FINDINGS: Lower chest: Lung bases are clear. Hepatobiliary: Hepatic steatosis with focal fatty sparing along the gallbladder fossa. Gallbladder is unremarkable. No intrahepatic or extrahepatic ductal dilatation. Pancreas: Peripancreatic fluid/inflammatory changes along the pancreatic head/uncinate process. No pancreatic necrosis or ductal dilatation. No drainable fluid collection/pseudocyst. Spleen: Within normal limits. Adrenals/Urinary Tract: Adrenal glands are within normal limits. Kidneys are within normal limits.  No hydronephrosis. Bladder is within normal limits. Stomach/Bowel: Stomach is within normal limits. No evidence of bowel obstruction. Mild wall thickening with secondary inflammatory changes involving the duodenum. Appendix is not discretely visualized. Vascular/Lymphatic: No evidence of abdominal aortic aneurysm. No suspicious abdominopelvic lymphadenopathy. Reproductive: Prostate is unremarkable. Other: No abdominopelvic ascites. Musculoskeletal: Visualized osseous structures are within normal limits. IMPRESSION: Acute pancreatitis.  No drainable fluid collection/pseudocyst. Mild secondary inflammatory changes involving the duodenum. Hepatic steatosis with focal fatty sparing. Electronically Signed   By: SJulian HyM.D.   On: 09/16/2017 02:10    Pending Labs Unresulted Labs (From admission, onward)   Start     Ordered   09/23/17 0500  Creatinine,  serum  (enoxaparin (LOVENOX)    CrCl >/= 30 ml/min)  Weekly,   R    Comments:  while on enoxaparin therapy    09/16/17 0255   09/16/17 0500  Triglycerides  Tomorrow morning,   R     09/16/17 0253   09/16/17 0500  Comprehensive metabolic panel  Tomorrow morning,   R     09/16/17 0253   09/16/17 0500  Hemoglobin A1c  Tomorrow morning,   R     09/16/17 0256   09/16/17 0500  Lipid panel  Tomorrow morning,   R     09/16/17 0315   09/16/17 0254  HIV antibody (Routine Testing)  Once,   R     09/16/17 0255   09/16/17 0254  CBC  (enoxaparin (LOVENOX)    CrCl >/= 30 ml/min)  Once,   R    Comments:  Baseline for enoxaparin therapy IF NOT ALREADY DRAWN.  Notify MD if PLT < 100 K.    09/16/17 0255   09/16/17 0254  Creatinine, serum  (enoxaparin (LOVENOX)    CrCl >/= 30 ml/min)  Once,   R    Comments:  Baseline for enoxaparin therapy IF NOT ALREADY DRAWN.    09/16/17 0255      Vitals/Pain Today's Vitals   09/16/17 0119 09/16/17 0200 09/16/17 0230 09/16/17 0245  BP:  130/85 (!) 152/105   Pulse:  (!) 106 (!) 103   Resp:  16 (!) 28   Temp:      TempSrc:      SpO2:  96% 97%   Weight:      Height:      PainSc: 10-Worst pain ever   8     Isolation Precautions No active isolations  Medications Medications  sodium chloride 0.9 % bolus 1,000 mL (0 mLs Intravenous Stopped 09/16/17 0103)    Followed by  0.9 %  sodium chloride infusion (1,000 mLs Intravenous New Bag/Given 09/16/17 0016)  famotidine (PEPCID) IVPB 20 mg premix (has no administration in time range)  HYDROmorphone (DILAUDID) injection 1 mg (has no administration in time range)  hydrALAZINE (APRESOLINE) injection 5 mg (has no administration in time range)  enoxaparin (LOVENOX) injection 40 mg (has no administration in time range)  oxyCODONE (Oxy IR/ROXICODONE) immediate release tablet 5 mg (has no administration in time range)  senna-docusate (Senokot-S) tablet 1 tablet (has no administration in time range)  ondansetron (ZOFRAN)  tablet 4 mg (has no administration in time range)    Or  ondansetron (ZOFRAN) injection 4 mg (has no administration in time range)  ondansetron (ZOFRAN-ODT) disintegrating tablet 4 mg (4 mg Oral Given 09/15/17 2012)  HYDROmorphone (DILAUDID) injection 1 mg (1 mg Intravenous Given 09/15/17 2308)  iopamidol (ISOVUE-300) 61 % injection 100 mL (100 mLs Intravenous Contrast Given 09/16/17 0138)  HYDROmorphone (DILAUDID) injection 1 mg (1 mg Intravenous Given 09/16/17 0119)  HYDROmorphone (DILAUDID) injection 1 mg (1 mg Intravenous Given 09/16/17 0245)    Mobility walks

## 2017-09-16 NOTE — Progress Notes (Signed)
Patient's temp 101. Too early to give tylenol. Paged Linton FlemingsX. Blount. New order placed for ibuprofen once. Continue to monitor.

## 2017-09-16 NOTE — H&P (Signed)
History and Physical  Douglas Angela NevinBurke Horiuchi Jr. RUE:[id number] DOB: 1985-03-01 DOA: 09/15/2017   PCP: System, Pcp Not In   Patient coming from: Home   Chief Complaint: Abdominal pain nausea   HPI: Douglas Angela NevinBurke Yip Jr. is a 32 y.o. male with medical history significant for HTN, prediabetes who does not taking his medications and is being admitted for pancreatitis. He started having abdominal pain in the late morning of 7/31, burning epigastric discomfort radiating to his back. Associated nausea and vomiting. No fevers, chills, sick contacts, new medications, does not drink alcohol except occasionally. Does not smoke cigarettes.    ED Course: In the ER, he was found to have elevated lipase and CT scan revealed pancreatitis. He was given IVF and analgesics and hospitalist called for admission.  Review of Systems: Please see HPI for pertinent positives and negatives. A complete 10 system review of systems are otherwise negative.  Past Medical History:  Diagnosis Date  . Hypercholesteremia   . Hypertension   . Prediabetes    Past Surgical History:  Procedure Laterality Date  . TONSILLECTOMY    . WISDOM TOOTH EXTRACTION      Social History:  reports that he has never smoked. He has never used smokeless tobacco. He reports that he does not drink alcohol or use drugs.   No Known Allergies  No family history on file.   Prior to Admission medications   Medication Sig Start Date End Date Taking? Authorizing Provider  fenofibrate 160 MG tablet Take 160 mg by mouth daily. 10/30/16  Yes [provider]  hydrochlorothiazide (HYDRODIURIL) 25 MG tablet Take 25 mg by mouth daily. 10/29/16  Yes [provider]  ibuprofen (ADVIL,MOTRIN) 800 MG tablet Take 800 mg by mouth daily as needed for pain. 09/24/16  Yes [provider]  rosuvastatin (CRESTOR) 20 MG tablet Take 20 mg by mouth daily. 10/30/16  Yes [provider]  dicyclomine (BENTYL) 20 MG tablet Take 1  tablet (20 mg total) by mouth 2 (two) times daily. Patient not taking: Reported on 09/15/2017 01/06/17   Glynn Octaveancour, Stephen, MD  ondansetron (ZOFRAN ODT) 4 MG disintegrating tablet Take 1 tablet (4 mg total) by mouth every 8 (eight) hours as needed for nausea or vomiting. Patient not taking: Reported on 09/15/2017 01/06/17   Glynn Octaveancour, Stephen, MD    Physical Exam: BP (!) 152/105   Pulse (!) 103   Temp 98.7 F (37.1 C) (Oral)   Resp (!) 28   Ht 6' (1.829 m)   Wt (!) 149.7 kg (330 lb)   SpO2 97%   BMI 44.76 kg/m   General:  Alert, oriented, calm, in no acute distress, wife at bedside  Eyes: EOMI, clear conjuctivae, white sclerea Neck: supple, no masses, trachea mildline  Cardiovascular: RRR, no murmurs or rubs, no peripheral edema  Respiratory: clear to auscultation bilaterally, no wheezes, no crackles  Abdomen: soft, tender to deep palpation not particularly so in the RUQ, distended, normal bowel tones heard  Skin: dry, no rashes  Musculoskeletal: no joint effusions, normal range of motion  Psychiatric: appropriate affect, normal speech  Neurologic: extraocular muscles intact, clear speech, moving all extremities with intact sensorium            Labs on Admission:  Basic Metabolic Panel: Recent Labs  Lab 09/15/17 2020  NA 133*  K 4.8  CL 103  CO2 18*  GLUCOSE 216*  BUN 14  CREATININE 0.96  CALCIUM 9.3   Liver Function Tests: Recent Labs  Lab 09/15/17 2020  AST 82*  ALT 72*  ALKPHOS 128*  BILITOT 2.3*  PROT 8.0  ALBUMIN 4.6   Recent Labs  Lab 09/15/17 2020  LIPASE 145*   No results for input(s): AMMONIA in the last 168 hours. CBC: Recent Labs  Lab 09/15/17 2020  WBC 13.3*  HGB 17.2*  HCT 46.3  MCV 83.3  PLT 254   Cardiac Enzymes: No results for input(s): CKTOTAL, CKMB, CKMBINDEX, TROPONINI in the last 168 hours.  BNP (last 3 results) No results for input(s): BNP in the last 8760 hours.  ProBNP (last 3 results) No results for input(s): PROBNP in  the last 8760 hours.  CBG: No results for input(s): GLUCAP in the last 168 hours.  Radiological Exams on Admission: Ct Abdomen Pelvis W Contrast  Result Date: 09/16/2017 CLINICAL DATA:  Abdominal pain, nausea, leukocytosis, elevated lipase, microhematuria EXAM: CT ABDOMEN AND PELVIS WITH CONTRAST TECHNIQUE: Multidetector CT imaging of the abdomen and pelvis was performed using the standard protocol following bolus administration of intravenous contrast. CONTRAST:  ISOVUE-300 IOPAMIDOL (ISOVUE-300) INJECTION 61% COMPARISON:  01/06/2017 FINDINGS: Lower chest: Lung bases are clear. Hepatobiliary: Hepatic steatosis with focal fatty sparing along the gallbladder fossa. Gallbladder is unremarkable. No intrahepatic or extrahepatic ductal dilatation. Pancreas: Peripancreatic fluid/inflammatory changes along the pancreatic head/uncinate process. No pancreatic necrosis or ductal dilatation. No drainable fluid collection/pseudocyst. Spleen: Within normal limits. Adrenals/Urinary Tract: Adrenal glands are within normal limits. Kidneys are within normal limits.  No hydronephrosis. Bladder is within normal limits. Stomach/Bowel: Stomach is within normal limits. No evidence of bowel obstruction. Mild wall thickening with secondary inflammatory changes involving the duodenum. Appendix is not discretely visualized. Vascular/Lymphatic: No evidence of abdominal aortic aneurysm. No suspicious abdominopelvic lymphadenopathy. Reproductive: Prostate is unremarkable. Other: No abdominopelvic ascites. Musculoskeletal: Visualized osseous structures are within normal limits. IMPRESSION: Acute pancreatitis.  No drainable fluid collection/pseudocyst. Mild secondary inflammatory changes involving the duodenum. Hepatic steatosis with focal fatty sparing. Electronically Signed   By: Charline Bills M.D.   On: 09/16/2017 02:10    Assessment/Plan Present on Admission: . Acute pancreatitis . Abdominal pain . Leukocytosis .  Hyponatremia . Abnormal LFTs . Obesity, Class III, BMI 40-49.9 (morbid obesity) (HCC) . Hypertension . Pancreatitis  32 year old [place] American male with history of HTN, prediabetes and medication noncompliance being admitted with mild acute pancreatitis likely from biliary stone disease versus hypertrigyceridemia. - observation admission - NPO - IVF - check fasting lipid panel and TRG level - IV dilaudid for severe pain - IV hydralazine for hypertension - RUQ [place] in AM Active Problems:   Abdominal pain   Leukocytosis   Hyponatremia - mild, asymptomatic, follow with AM labs   Abnormal LFTs   Obesity, Class III, BMI 40-49.9 (morbid obesity) (HCC)   Hypertension - uncontrolled, does not take meds, counseled on risks of long term uncontrolled HTN   Pancreatitis  DVT prophylaxis: Lovenox   Code Status: FULL   Family Communication: Discussed with wife and patient in room this AM.   Disposition Plan: Home at DC.   Consults called: None   Admission status: Observation   Time spent: 34 minutes  Mir Vergie Living MD [facility] Pager [phone]  If 7PM-7AM, please contact night-coverage [email or web address] Password [email or web address]  09/16/2017, 3:15 AM

## 2017-09-16 NOTE — ED Notes (Addendum)
US Tech in the bedside

## 2017-09-16 NOTE — Progress Notes (Signed)
Lovenox per Pharmacy for DVT Prophylaxis    Pharmacy has been consulted from dosing enoxaparin (lovenox) in this patient for DVT prophylaxis.  The pharmacist has reviewed pertinent labs (Hgb _17.2__; PLT_254__), patient weight (_149__kg) and renal function (CrCl_>90__mL/min) and decided that enoxaparin _60_mg SQ Q24Hrs is appropriate for this patient.  The pharmacy department will sign off at this time.  Please reconsult pharmacy if status changes or for further issues.  Thank you  Luetta NuttingJulian Crowford Brittany Amirault, Jr PharmD, BCPS  09/16/2017, 4:42 AM

## 2017-09-16 NOTE — Progress Notes (Signed)
  PROGRESS NOTE  Patient admitted earlier this morning. See H&P. Douglas Angela NevinBurke Jacobowitz Jr. is a 32 yo with past medical history significant for essential hypertension, hypercholesterolemia, prediabetes who is not on any current medications at home who started having abdominal pain.  He admitted to burning epigastric discomfort radiating to his back, with nausea and vomiting.  He denies any alcohol use other than social use couple times monthly.  In the emergency department, he was found to have elevated lipase, CT scan positive for pancreatitis.  Right upper quadrant ultrasound was negative for gallstones.  Triglycerides over 5000, hemoglobin A1c 8.9. Transfer to stepdown unit for IV insulin to treat hypertriglyceridemia-associated pancreatitis N.p.o. IV fluid Pain control and antiemetics   Noralee StainJennifer Daria Mcmeekin, DO [facility] [email or web address] Password [email or web address] 09/16/2017, 10:41 AM

## 2017-09-16 NOTE — Progress Notes (Signed)
[facility], [place].  Pt transferred to RM 1238 in stable condition.

## 2017-09-16 NOTE — ED Provider Notes (Signed)
CT confirms acute pancreatitis.  Patient denies alcohol abuse.  No signs of cholecystitis by CT imaging.  Due to intractable pain will get admitted.  Discussed with hospitalist for admission   Douglas [name], DoZadie Rhinenald, MD 09/16/17 [phone]

## 2017-09-16 NOTE — Progress Notes (Signed)
Upon assessment this am, RN witnessed pt vomit three times, clear emesis after ambulating to the bathroom.  Pt reporting 8/10 pain.  Zofran and dilaudid given.Order placed by MD for transfer.  Vitals taken, see flowsheet. RN will monitor.

## 2017-09-17 LAB — GLUCOSE, CAPILLARY
GLUCOSE-CAPILLARY: 156 mg/dL — AB (ref 70–99)
GLUCOSE-CAPILLARY: 235 mg/dL — AB (ref 70–99)
GLUCOSE-CAPILLARY: 244 mg/dL — AB (ref 70–99)
GLUCOSE-CAPILLARY: 237 mg/dL — AB (ref 70–99)
GLUCOSE-CAPILLARY: 210 mg/dL — AB (ref 70–99)
GLUCOSE-CAPILLARY: 247 mg/dL — AB (ref 70–99)
Glucose-Capillary: 170 mg/dL — ABNORMAL HIGH (ref 70–99)
Glucose-Capillary: 166 mg/dL — ABNORMAL HIGH (ref 70–99)
Glucose-Capillary: 175 mg/dL — ABNORMAL HIGH (ref 70–99)
Glucose-Capillary: 156 mg/dL — ABNORMAL HIGH (ref 70–99)
Glucose-Capillary: 232 mg/dL — ABNORMAL HIGH (ref 70–99)
Glucose-Capillary: 203 mg/dL — ABNORMAL HIGH (ref 70–99)
Glucose-Capillary: 242 mg/dL — ABNORMAL HIGH (ref 70–99)
Glucose-Capillary: 217 mg/dL — ABNORMAL HIGH (ref 70–99)
Glucose-Capillary: 237 mg/dL — ABNORMAL HIGH (ref 70–99)
Glucose-Capillary: 242 mg/dL — ABNORMAL HIGH (ref 70–99)
Glucose-Capillary: 244 mg/dL — ABNORMAL HIGH (ref 70–99)
Glucose-Capillary: 212 mg/dL — ABNORMAL HIGH (ref 70–99)

## 2017-09-17 LAB — BASIC METABOLIC PANEL
ANION GAP: 16 — AB (ref 5–15)
ANION GAP: 12 (ref 5–15)
ANION GAP: 10 (ref 5–15)
Anion gap: 13 (ref 5–15)
BUN: 5 mg/dL — ABNORMAL LOW (ref 6–20)
BUN: 7 mg/dL (ref 6–20)
BUN: 10 mg/dL (ref 6–20)
BUN: 10 mg/dL (ref 6–20)
CALCIUM: 7.2 mg/dL — AB (ref 8.9–10.3)
CHLORIDE: 101 mmol/L (ref 98–111)
CO2: 19 mmol/L — ABNORMAL LOW (ref 22–32)
CO2: 18 mmol/L — ABNORMAL LOW (ref 22–32)
CO2: 21 mmol/L — ABNORMAL LOW (ref 22–32)
CO2: 22 mmol/L (ref 22–32)
CREATININE: 1.07 mg/dL (ref 0.61–1.24)
CREATININE: 1.17 mg/dL (ref 0.61–1.24)
Calcium: 7.7 mg/dL — ABNORMAL LOW (ref 8.9–10.3)
Calcium: 7.5 mg/dL — ABNORMAL LOW (ref 8.9–10.3)
Calcium: 7.4 mg/dL — ABNORMAL LOW (ref 8.9–10.3)
Chloride: 99 mmol/L (ref 98–111)
Chloride: 98 mmol/L (ref 98–111)
Chloride: 97 mmol/L — ABNORMAL LOW (ref 98–111)
Creatinine, Ser: 1.01 mg/dL (ref 0.61–1.24)
Creatinine, Ser: 1.16 mg/dL (ref 0.61–1.24)
GFR calc Af Amer: >60 mL/min (ref >60)
GFR calc Af Amer: >60 mL/min (ref >60)
GFR calc Af Amer: >60 mL/min (ref >60)
GFR calc non Af Amer: >60 mL/min (ref >60)
GFR calc non Af Amer: >60 mL/min (ref >60)
GFR calc non Af Amer: >60 mL/min (ref >60)
GFR calc non Af Amer: >60 mL/min (ref >60)
GLUCOSE: 156 mg/dL — AB (ref 70–99)
GLUCOSE: 213 mg/dL — AB (ref 70–99)
Glucose, Bld: 233 mg/dL — ABNORMAL HIGH (ref 70–99)
Glucose, Bld: 193 mg/dL — ABNORMAL HIGH (ref 70–99)
POTASSIUM: 4.5 mmol/L (ref 3.5–5.1)
Potassium: 3.5 mmol/L (ref 3.5–5.1)
Potassium: 4.3 mmol/L (ref 3.5–5.1)
Potassium: 4.1 mmol/L (ref 3.5–5.1)
SODIUM: 133 mmol/L — AB (ref 135–145)
Sodium: 133 mmol/L — ABNORMAL LOW (ref 135–145)
Sodium: 131 mmol/L — ABNORMAL LOW (ref 135–145)
Sodium: 129 mmol/L — ABNORMAL LOW (ref 135–145)

## 2017-09-17 LAB — CBC
HCT: 47.6 % (ref 39.0–52.0)
Hemoglobin: 16.6 g/dL (ref 13.0–17.0)
MCH: 30.2 pg (ref 26.0–34.0)
MCHC: 34.9 g/dL (ref 30.0–36.0)
MCV: 86.5 fL (ref 78.0–100.0)
Platelets: 245 10*3/uL (ref 150–400)
RBC: 5.5 MIL/uL (ref 4.22–5.81)
RDW: 14.7 % (ref 11.5–15.5)
WBC: 12.2 10*3/uL — ABNORMAL HIGH (ref 4.0–10.5)

## 2017-09-17 LAB — TRIGLYCERIDES
TRIGLYCERIDES: 2391 mg/dL — AB (ref <150)
Triglycerides: 2100 mg/dL — ABNORMAL HIGH (ref <150)

## 2017-09-17 MED ORDER — LIVING WELL WITH DIABETES BOOK
Freq: Once | Status: AC
  Administered 2017-09-17: 18:00:00
  Filled 2017-09-17 (×2): qty 1

## 2017-09-17 MED ORDER — HYDRALAZINE HCL 20 MG/ML IJ SOLN
10.0000 mg | Freq: Four times a day (QID) | INTRAMUSCULAR | Status: DC | PRN
  Administered 2017-09-18 – 2017-09-21 (×5): 10 mg via INTRAVENOUS
  Filled 2017-09-17 (×5): qty 1

## 2017-09-17 MED ORDER — SODIUM CHLORIDE 4 MEQ/ML IV SOLN
INTRAVENOUS | Status: DC
  Filled 2017-09-17 (×2): qty 980.75

## 2017-09-17 MED ORDER — FAMOTIDINE 20 MG PO TABS
20.0000 mg | ORAL_TABLET | Freq: Once | ORAL | Status: AC
  Administered 2017-09-17: 20 mg via ORAL
  Filled 2017-09-17: qty 1

## 2017-09-17 MED ORDER — METOPROLOL TARTRATE 5 MG/5ML IV SOLN
5.0000 mg | INTRAVENOUS | Status: AC | PRN
  Administered 2017-09-18 (×2): 5 mg via INTRAVENOUS
  Filled 2017-09-17 (×2): qty 5

## 2017-09-17 MED ORDER — SODIUM CHLORIDE 4 MEQ/ML IV SOLN
INTRAVENOUS | Status: DC
  Administered 2017-09-17 – 2017-09-21 (×13): via INTRAVENOUS
  Filled 2017-09-17 (×22): qty 980.75

## 2017-09-17 MED ORDER — FENTANYL CITRATE (PF) 100 MCG/2ML IJ SOLN
25.0000 ug | INTRAMUSCULAR | Status: AC | PRN
  Administered 2017-09-17 – 2017-09-18 (×2): 25 ug via INTRAVENOUS
  Filled 2017-09-17 (×2): qty 2

## 2017-09-17 NOTE — Progress Notes (Addendum)
PROGRESS NOTE    Douglas Angela NevinBurke Boulay Jr.  ZOX:[id number] DOB: 1985-10-21 DOA: 09/15/2017 PCP: System, Pcp Not In     Brief Narrative:  Douglas Goommie Burke Smigelski Jr. is a 32 yo with past medical history significant for essential hypertension, hypercholesterolemia, prediabetes who is not on any current medications at home who started having abdominal pain.  He admitted to burning epigastric discomfort radiating to his back, with nausea and vomiting.  He denies any alcohol use other than social use couple times monthly.  In the emergency department, he was found to have elevated lipase, CT scan positive for pancreatitis.  Right upper quadrant ultrasound was negative for gallstones. Triglycerides over 5000, hemoglobin A1c 8.9. He was started on IV insulin for hypertriglyceridemia-associated pancreatitis.   New events last 24 hours / Subjective: No new events, had couple febrile episodes but doing better this morning. States he still has abdominal soreness but pain has improved since yesterday. No more nausea, vomiting.   Assessment & Plan:   Principal Problem:   Acute pancreatitis Active Problems:   Abdominal pain   Leukocytosis   Hyponatremia   Abnormal LFTs   Obesity, Class III, BMI 40-49.9 (morbid obesity) (HCC)   Hypertension   Pancreatitis   Acute pancreatitis without infection or necrosis  Acute hypertriglyceridemia associated pancreatitis Continue IV insulin until triglyceride less than 500 Continue blood sugar monitoring Continue D10, half-normal saline Continue Lipitor, fenofibrate Advance to clear liquid diet Continue pain control, antiemetic  Newly diagnosed diabetes mellitus Hemoglobin A1c 8.9 Continue IV insulin for now, he will need oral hypoglycemic on discharge DM coordinator consult    DVT prophylaxis: Lovenox Code Status: Full Family Communication: No family at bedside, spoke with mother over the phone  Disposition Plan: Pending improvement   Consultants:    None  Procedures:   None   Antimicrobials:  Anti-infectives (From admission, onward)   None        Objective: Vitals:   09/17/17 0445 09/17/17 0600 09/17/17 0800 09/17/17 1159  BP: (!) 141/103 (!) 157/100 116/66 (!) 145/106  Pulse: (!) 115 (!) 114 (!) 112 (!) 111  Resp: (!) 22 (!) 28 (!) 23 (!) 38  Temp:   98.9 F (37.2 C) 100 F (37.8 C)  TempSrc:   Oral Oral  SpO2: 94% 95% 98% 95%  Weight:      Height:        Intake/Output Summary (Last 24 hours) at 09/17/2017 1202 Last data filed at 09/17/2017 1109 Gross per 24 hour  Intake 2854.44 ml  Output 950 ml  Net 1904.44 ml   Filed Weights   09/15/17 1944 09/16/17 1000  Weight: (!) 149.7 kg (330 lb) (!) 149.4 kg (329 lb 5.9 oz)    Examination:  General exam: Appears calm and comfortable  Respiratory system: Clear to auscultation. Respiratory effort normal. Cardiovascular system: S1 & S2 heard, tachycardic, regular rhythm. No JVD, murmurs, rubs, gallops or clicks. No pedal edema. Gastrointestinal system: Abdomen is nondistended, soft and tender to palpation epigastric. No organomegaly or masses felt. Normal bowel sounds heard. Central nervous system: Alert and oriented. No focal neurological deficits. Extremities: Symmetric 5 x 5 power. Skin: No rashes, lesions or ulcers Psychiatry: Judgement and insight appear normal. Mood & affect appropriate.   Data Reviewed: I have personally reviewed following labs and imaging studies  CBC: Recent Labs  Lab 09/15/17 2020 09/16/17 0544 09/17/17 0511  WBC 13.3* 12.6* 12.2*  HGB 17.2* 15.1 16.6  HCT 46.3 42.0 47.6  MCV 83.3 84.7 86.5  PLT 254 240 245   Basic Metabolic Panel: Recent Labs  Lab 09/16/17 1103 09/16/17 1434 09/16/17 1858 09/16/17 2247 09/17/17 0511  NA 135 135 133* 133* 131*  K 3.9 3.4* 3.5 4.5 4.3  CL 103 102 101 99 98  CO2 18* 19* 19* 18* 21*  GLUCOSE 278* 260* 233* 193* 156*  BUN 6 5* 5* 7 10  CREATININE 0.86 0.98 1.07 1.01 1.16  CALCIUM 8.0*  8.0* 7.7* 7.5* 7.4*   GFR: Estimated Creatinine Clearance: 138.7 mL/min (by C-G formula based on SCr of 1.16 mg/dL). Liver Function Tests: Recent Labs  Lab 09/15/17 2020 09/16/17 0544  AST 82* 61*  ALT 72* 64*  ALKPHOS 128* 81  BILITOT 2.3* 1.7*  PROT 8.0 7.3  ALBUMIN 4.6 4.2   Recent Labs  Lab 09/15/17 2020  LIPASE 145*   No results for input(s): AMMONIA in the last 168 hours. Coagulation Profile: No results for input(s): INR, PROTIME in the last 168 hours. Cardiac Enzymes: No results for input(s): CKTOTAL, CKMB, CKMBINDEX, TROPONINI in the last 168 hours. BNP (last 3 results) No results for input(s): PROBNP in the last 8760 hours. HbA1C: Recent Labs    09/16/17 0544  HGBA1C 8.9*   CBG: Recent Labs  Lab 09/17/17 0729 09/17/17 0843 09/17/17 0941 09/17/17 1055 09/17/17 1157  GLUCAP 232* 203* 242* 235* 217*   Lipid Profile: Recent Labs    09/16/17 0544 09/16/17 1652 09/17/17 0511  CHOL 777*  --   --   HDL NOT REPORTED DUE TO HIGH TRIGLYCERIDES  --   --   LDLCALC UNABLE TO CALCULATE IF TRIGLYCERIDE OVER 400 mg/dL  --   --   TRIG >1,610*  >5,000* 4,960* 2,100*  CHOLHDL NOT REPORTED DUE TO HIGH TRIGLYCERIDES  --   --    Thyroid Function Tests: No results for input(s): TSH, T4TOTAL, FREET4, T3FREE, THYROIDAB in the last 72 hours. Anemia Panel: No results for input(s): VITAMINB12, FOLATE, FERRITIN, TIBC, IRON, RETICCTPCT in the last 72 hours. Sepsis Labs: No results for input(s): PROCALCITON, LATICACIDVEN in the last 168 hours.  Recent Results (from the past 240 hour(s))  MRSA PCR Screening     Status: None   Collection Time: 09/16/17  9:43 AM  Result Value Ref Range Status   MRSA by PCR NEGATIVE NEGATIVE Final    Comment:        The GeneXpert MRSA Assay (FDA approved for NASAL specimens only), is one component of a comprehensive MRSA colonization surveillance program. It is not intended to diagnose MRSA infection nor to guide or monitor treatment  for MRSA infections. Performed at [facility], [address] [place], [place] [address]        Radiology Studies: US Abdomen Complete  Result Date: 09/16/2017 CLINICAL DATA:  Initial evaluation for acute pancreatitis. EXAM: ABDOMEN ULTRASOUND COMPLETE COMPARISON:  Prior CT from earlier the same day. FINDINGS: Gallbladder: No stones or sludge within the gallbladder lumen. Gallbladder wall measures at the upper limits of normal 3.6 mm. No free pericholecystic fluid. No sonographic Murphy sign elicited on exam part Common bile duct: Diameter: 4.8 mm Liver: No focal lesion identified. Diffusely increased echogenicity, consistent with steatosis. Portal vein is patent on color Doppler imaging with normal direction of blood flow towards the liver. IVC: No abnormality visualized. Pancreas: Not well visualized due to overlying bowel gas. Spleen: Size and appearance within normal limits. Right Kidney: Length: 12.1 cm. Echogenicity within normal limits. No mass or hydronephrosis visualized. Left Kidney: Length: 12.0  cm. Echogenicity within normal limits. No mass or hydronephrosis visualized. Abdominal aorta: No aneurysm visualized. Other findings: None. IMPRESSION: 1. Hepatic steatosis. 2. No other acute intra-abdominal abnormality. No cholelithiasis or evidence for acute cholecystitis. No biliary dilatation. Electronically Signed   By: Rise Mu M.D.   On: 09/16/2017 04:38   Ct Abdomen Pelvis W Contrast  Result Date: 09/16/2017 CLINICAL DATA:  Abdominal pain, nausea, leukocytosis, elevated lipase, microhematuria EXAM: CT ABDOMEN AND PELVIS WITH CONTRAST TECHNIQUE: Multidetector CT imaging of the abdomen and pelvis was performed using the standard protocol following bolus administration of intravenous contrast. CONTRAST:  ISOVUE-300 IOPAMIDOL (ISOVUE-300) INJECTION 61% COMPARISON:  01/06/2017 FINDINGS: Lower chest: Lung bases are clear. Hepatobiliary: Hepatic steatosis with  focal fatty sparing along the gallbladder fossa. Gallbladder is unremarkable. No intrahepatic or extrahepatic ductal dilatation. Pancreas: Peripancreatic fluid/inflammatory changes along the pancreatic head/uncinate process. No pancreatic necrosis or ductal dilatation. No drainable fluid collection/pseudocyst. Spleen: Within normal limits. Adrenals/Urinary Tract: Adrenal glands are within normal limits. Kidneys are within normal limits.  No hydronephrosis. Bladder is within normal limits. Stomach/Bowel: Stomach is within normal limits. No evidence of bowel obstruction. Mild wall thickening with secondary inflammatory changes involving the duodenum. Appendix is not discretely visualized. Vascular/Lymphatic: No evidence of abdominal aortic aneurysm. No suspicious abdominopelvic lymphadenopathy. Reproductive: Prostate is unremarkable. Other: No abdominopelvic ascites. Musculoskeletal: Visualized osseous structures are within normal limits. IMPRESSION: Acute pancreatitis.  No drainable fluid collection/pseudocyst. Mild secondary inflammatory changes involving the duodenum. Hepatic steatosis with focal fatty sparing. Electronically Signed   By: Charline Bills M.D.   On: 09/16/2017 02:10      Scheduled Meds: . atorvastatin  40 mg Oral q1800  . enoxaparin (LOVENOX) injection  60 mg Subcutaneous Q24H  . fenofibrate  160 mg Oral Daily   Continuous Infusions: . sodium chloride Stopped (09/16/17 2203)  . famotidine (PEPCID) IV Stopped (09/16/17 2233)  . insulin regular infusion 1 unit/mL 15 Units/hr (09/17/17 1109)  . dextrose 10 % with additives Pediatric IV fluid 125 mL/hr at 09/17/17 1109     LOS: 1 day    Time spent: 25 minutes   Noralee Stain, DO [facility] [email or web address] Password [email or web address] 09/17/2017, 12:02 PM

## 2017-09-17 NOTE — Progress Notes (Signed)
Inpatient Diabetes Program Recommendations  AACE/ADA: New Consensus Statement on Inpatient Glycemic Control (2015)  Target Ranges:  Prepandial:   less than 140 mg/dL      Peak postprandial:   less than 180 mg/dL (1-2 hours)      Critically ill patients:  140 - 180 mg/dL   Lab Results  Component Value Date   GLUCAP 242 (H) 09/17/2017   HGBA1C 8.9 (H) 09/16/2017    Review of Glycemic Control  Diabetes history: Pre-diabetes Outpatient Diabetes medications: None Current orders for Inpatient glycemic control: IV insulin - 15 units/hour  HgbA1C - 8.9% - + diagnosis of DM  Inpatient Diabetes Program Recommendations:     Continue IV insulin until triglycerides < 500.   Spoke with pt regarding his diagnosis of DM. Pt states he had lost weight before vacation, but gained it back and failed to get back on track. Pt states he knows how to lose weight, just needs to do it. Had started to increase his physical activity. Discussed importance of diabetes control, f/u with PCP for diabetes management, and monitoring blood sugars at home. Answered questions and pt seems motivated to make needed lifestyle changes. Discussed how diet and exercise affect blood sugars.  Hopefully can be discharged on OHAs, but may need low-dose basal insulin.   Will continue to follow.  Thank you. Douglas [name], RD, LDN, CDE Inpatient Diabetes Coordinator [phone]

## 2017-09-18 ENCOUNTER — Inpatient Hospital Stay (HOSPITAL_COMMUNITY): Payer: 59

## 2017-09-18 LAB — GLUCOSE, CAPILLARY
GLUCOSE-CAPILLARY: 142 mg/dL — AB (ref 70–99)
GLUCOSE-CAPILLARY: 151 mg/dL — AB (ref 70–99)
GLUCOSE-CAPILLARY: 164 mg/dL — AB (ref 70–99)
GLUCOSE-CAPILLARY: 166 mg/dL — AB (ref 70–99)
GLUCOSE-CAPILLARY: 170 mg/dL — AB (ref 70–99)
GLUCOSE-CAPILLARY: 170 mg/dL — AB (ref 70–99)
Glucose-Capillary: 160 mg/dL — ABNORMAL HIGH (ref 70–99)
Glucose-Capillary: 183 mg/dL — ABNORMAL HIGH (ref 70–99)
Glucose-Capillary: 195 mg/dL — ABNORMAL HIGH (ref 70–99)
Glucose-Capillary: 166 mg/dL — ABNORMAL HIGH (ref 70–99)
Glucose-Capillary: 194 mg/dL — ABNORMAL HIGH (ref 70–99)
Glucose-Capillary: 136 mg/dL — ABNORMAL HIGH (ref 70–99)
Glucose-Capillary: 117 mg/dL — ABNORMAL HIGH (ref 70–99)
Glucose-Capillary: 154 mg/dL — ABNORMAL HIGH (ref 70–99)
Glucose-Capillary: 123 mg/dL — ABNORMAL HIGH (ref 70–99)

## 2017-09-18 LAB — HEPATIC FUNCTION PANEL
ALBUMIN: 3 g/dL — AB (ref 3.5–5.0)
ALT: 26 U/L (ref 0–44)
AST: 31 U/L (ref 15–41)
Alkaline Phosphatase: 55 U/L (ref 38–126)
BILIRUBIN TOTAL: 0.9 mg/dL (ref 0.3–1.2)
Bilirubin, Direct: 0.2 mg/dL (ref 0.0–0.2)
Indirect Bilirubin: 0.7 mg/dL (ref 0.3–0.9)
Total Protein: 7.1 g/dL (ref 6.5–8.1)

## 2017-09-18 LAB — CBC
HCT: 42.5 % (ref 39.0–52.0)
HEMOGLOBIN: 14.7 g/dL (ref 13.0–17.0)
MCH: 29.6 pg (ref 26.0–34.0)
MCHC: 34.6 g/dL (ref 30.0–36.0)
MCV: 85.5 fL (ref 78.0–100.0)
Platelets: 247 10*3/uL (ref 150–400)
RBC: 4.97 MIL/uL (ref 4.22–5.81)
RDW: 15 % (ref 11.5–15.5)
WBC: 10.7 10*3/uL — ABNORMAL HIGH (ref 4.0–10.5)

## 2017-09-18 LAB — BASIC METABOLIC PANEL
Anion gap: 8 (ref 5–15)
BUN: 7 mg/dL (ref 6–20)
CHLORIDE: 97 mmol/L — AB (ref 98–111)
CO2: 24 mmol/L (ref 22–32)
CREATININE: 1.07 mg/dL (ref 0.61–1.24)
Calcium: 7.7 mg/dL — ABNORMAL LOW (ref 8.9–10.3)
GFR calc non Af Amer: >60 mL/min (ref >60)
Glucose, Bld: 169 mg/dL — ABNORMAL HIGH (ref 70–99)
Potassium: 4.1 mmol/L (ref 3.5–5.1)
SODIUM: 129 mmol/L — AB (ref 135–145)

## 2017-09-18 LAB — TRIGLYCERIDES
TRIGLYCERIDES: 1476 mg/dL — AB (ref <150)
Triglycerides: 1527 mg/dL — ABNORMAL HIGH (ref <150)

## 2017-09-18 LAB — LIPASE, BLOOD: LIPASE: 62 U/L — AB (ref 11–51)

## 2017-09-18 MED ORDER — SODIUM CHLORIDE 0.9 % IV SOLN
INTRAVENOUS | Status: DC
  Administered 2017-09-18 (×2): via INTRAVENOUS

## 2017-09-18 MED ORDER — FENTANYL CITRATE (PF) 100 MCG/2ML IJ SOLN
25.0000 ug | INTRAMUSCULAR | Status: AC | PRN
  Administered 2017-09-18 (×2): 25 ug via INTRAVENOUS
  Filled 2017-09-18 (×2): qty 2

## 2017-09-18 MED ORDER — SODIUM CHLORIDE 0.9 % IV BOLUS
500.0000 mL | Freq: Once | INTRAVENOUS | Status: AC
  Administered 2017-09-18: 500 mL via INTRAVENOUS

## 2017-09-18 MED ORDER — IBUPROFEN 200 MG PO TABS
400.0000 mg | ORAL_TABLET | Freq: Once | ORAL | Status: AC
  Administered 2017-09-18: 400 mg via ORAL
  Filled 2017-09-18 (×2): qty 2

## 2017-09-18 MED ORDER — IOPAMIDOL (ISOVUE-300) INJECTION 61%
100.0000 mL | Freq: Once | INTRAVENOUS | Status: AC | PRN
  Administered 2017-09-18: 100 mL via INTRAVENOUS

## 2017-09-18 MED ORDER — IOPAMIDOL (ISOVUE-300) INJECTION 61%
INTRAVENOUS | Status: AC
  Filled 2017-09-18: qty 100

## 2017-09-18 NOTE — Progress Notes (Signed)
Patient's pain 8/10 and HR sustaining in 130s. Patient stated last diluadid given did not work at all. Paged [facility]. New order for fentanyl ordered and given. Continue to monitor.

## 2017-09-18 NOTE — Progress Notes (Signed)
Pt had CBG of 123 goal of 150, MD notified X Blount, VO to increase IV D10 to 150.

## 2017-09-18 NOTE — Progress Notes (Signed)
CBG 142. Paged [facility]. Continue to monitor.

## 2017-09-18 NOTE — Progress Notes (Addendum)
PROGRESS NOTE    Douglas Angela NevinBurke Feltman Jr.  NGE:[id number] DOB: [birth year] DOA: 09/15/2017 PCP: System, Pcp Not In     Brief Narrative:  Douglas Goommie Burke Dumler Jr. is a 32 yo with past medical history significant for essential hypertension, hypercholesterolemia, prediabetes who is not on any current medications at home who started having abdominal pain.  He admitted to burning epigastric discomfort radiating to his back, with nausea and vomiting.  He denies any alcohol use other than social use couple times monthly.  In the emergency department, he was found to have elevated lipase, CT scan positive for pancreatitis.  Right upper quadrant ultrasound was negative for gallstones. Triglycerides over 5000, hemoglobin A1c 8.9. He was started on IV insulin for hypertriglyceridemia-associated pancreatitis.   New events last 24 hours / Subjective: Continues to have fevers, 101.1 this morning, tachycardic.  He continues to have epigastric abdominal pain, no nausea or vomiting.  Tolerated clear liquid diet although lack of appetite.  Assessment & Plan:   Principal Problem:   Acute pancreatitis Active Problems:   Abdominal pain   Leukocytosis   Hyponatremia   Abnormal LFTs   Obesity, Class III, BMI 40-49.9 (morbid obesity) (HCC)   Hypertension   Pancreatitis   Acute pancreatitis without infection or necrosis  Acute hypertriglyceridemia associated pancreatitis -Continue IV insulin until triglyceride less than 500. TG trending down  -Continue blood sugar monitoring -Continue D10, half-normal saline, avoid hypoglycemia  -Continue Lipitor, fenofibrate -Advance to clear liquid diet -Continue pain control, antiemetic  Fever -Secondary to above. However, without improvement in abdominal pain and continued fever, will check CT abdomen today Addendum: CT abdomen report worsening pancreatitis, adynamic ileus. Added lipase and LFT to labs today. Spoke with Dr. Matthias [facility]hsBuccini [facility] and discussed case  curbside. For now, we will hold off antibiotics as there was no sign of necrosis or abscess on imaging. Increase IVF. I've added NS at 21200ml/hr for next 10 hours. Stricti I/Os. NPO. Will continue to monitor closely. Discussed with RN and with patient.  -Obtain blood culture  Newly diagnosed diabetes mellitus -Hemoglobin A1c 8.9 -Continue IV insulin for now, he will need oral hypoglycemic on discharge -DM coordinator consult    DVT prophylaxis: Lovenox Code Status: Full Family Communication: No family at bedside Disposition Plan: Pending improvement   Consultants:   None  Procedures:   None   Antimicrobials:  Anti-infectives (From admission, onward)   None       Objective: Vitals:   09/18/17 0600 09/18/17 0700 09/18/17 0800 09/18/17 0909  BP: (!) 151/79 136/65 137/90   Pulse: (!) 114 (!) 114 (!) 116   Resp: (!) 43 (!) 32 (!) 26   Temp:   (!) 101.1 F (38.4 C) 98.8 F (37.1 C)  TempSrc:   Oral Oral  SpO2: 94% 94% 96%   Weight:      Height:        Intake/Output Summary (Last 24 hours) at 09/18/2017 1036 Last data filed at 09/18/2017 0800 Gross per 24 hour  Intake 2958.91 ml  Output 400 ml  Net 2558.91 ml   Filed Weights   09/15/17 1944 09/16/17 1000  Weight: (!) 149.7 kg (330 lb) (!) 149.4 kg (329 lb 5.9 oz)    Examination: General exam: Appears calm and comfortable  Respiratory system: Clear to auscultation. Respiratory effort normal. Cardiovascular system: S1 & S2 heard, tachycardic, regular rhythm. No JVD, murmurs, rubs, gallops or clicks. No pedal edema. Gastrointestinal system: Abdomen is mildly distended, soft and TTP epigastric.  No organomegaly or masses felt. Normal bowel sounds heard. Central nervous system: Alert and oriented. No focal neurological deficits. Extremities: Symmetric 5 x 5 power. Skin: No rashes, lesions or ulcers Psychiatry: Judgement and insight appear normal. Mood & affect appropriate.    Data Reviewed: I have personally reviewed  following labs and imaging studies  CBC: Recent Labs  Lab 09/15/17 2020 09/16/17 0544 09/17/17 0511 09/18/17 0514  WBC 13.3* 12.6* 12.2* 10.7*  HGB 17.2* 15.1 16.6 14.7  HCT 46.3 42.0 47.6 42.5  MCV 83.3 84.7 86.5 85.5  PLT 254 240 245 247   Basic Metabolic Panel: Recent Labs  Lab 09/16/17 1858 09/16/17 2247 09/17/17 0511 09/17/17 1251 09/18/17 0514  NA 133* 133* 131* 129* 129*  K 3.5 4.5 4.3 4.1 4.1  CL 101 99 98 97* 97*  CO2 19* 18* 21* 22 24  GLUCOSE 233* 193* 156* 213* 169*  BUN 5* 7 10 10 7   CREATININE 1.07 1.01 1.16 1.17 1.07  CALCIUM 7.7* 7.5* 7.4* 7.2* 7.7*   GFR: Estimated Creatinine Clearance: 150.4 mL/min (by C-G formula based on SCr of 1.07 mg/dL). Liver Function Tests: Recent Labs  Lab 09/15/17 2020 09/16/17 0544  AST 82* 61*  ALT 72* 64*  ALKPHOS 128* 81  BILITOT 2.3* 1.7*  PROT 8.0 7.3  ALBUMIN 4.6 4.2   Recent Labs  Lab 09/15/17 2020  LIPASE 145*   No results for input(s): AMMONIA in the last 168 hours. Coagulation Profile: No results for input(s): INR, PROTIME in the last 168 hours. Cardiac Enzymes: No results for input(s): CKTOTAL, CKMB, CKMBINDEX, TROPONINI in the last 168 hours. BNP (last 3 results) No results for input(s): PROBNP in the last 8760 hours. HbA1C: Recent Labs    09/16/17 0544  HGBA1C 8.9*   CBG: Recent Labs  Lab 09/18/17 0427 09/18/17 0608 09/18/17 0644 09/18/17 0805 09/18/17 1013  GLUCAP 183* 170* 164* 166* 195*   Lipid Profile: Recent Labs    09/16/17 0544  09/17/17 1646 09/18/17 0514  CHOL 777*  --   --   --   HDL NOT REPORTED DUE TO HIGH TRIGLYCERIDES  --   --   --   LDLCALC UNABLE TO CALCULATE IF TRIGLYCERIDE OVER 400 mg/dL  --   --   --   TRIG >4,098*  >5,000*   < > 2,391* 1,527*  CHOLHDL NOT REPORTED DUE TO HIGH TRIGLYCERIDES  --   --   --    < > = values in this interval not displayed.   Thyroid Function Tests: No results for input(s): TSH, T4TOTAL, FREET4, T3FREE, THYROIDAB in the last  72 hours. Anemia Panel: No results for input(s): VITAMINB12, FOLATE, FERRITIN, TIBC, IRON, RETICCTPCT in the last 72 hours. Sepsis Labs: No results for input(s): PROCALCITON, LATICACIDVEN in the last 168 hours.  Recent Results (from the past 240 hour(s))  MRSA PCR Screening     Status: None   Collection Time: 09/16/17  9:43 AM  Result Value Ref Range Status   MRSA by PCR NEGATIVE NEGATIVE Final    Comment:        The GeneXpert MRSA Assay (FDA approved for NASAL specimens only), is one component of a comprehensive MRSA colonization surveillance program. It is not intended to diagnose MRSA infection nor to guide or monitor treatment for MRSA infections. Performed at [facility], [address], [place], [place] [address]        Radiology Studies: No results found.    Scheduled Meds: . atorvastatin  40 mg Oral q1800  . enoxaparin (LOVENOX) injection  60 mg Subcutaneous Q24H  . fenofibrate  160 mg Oral Daily   Continuous Infusions: . sodium chloride Stopped (09/16/17 2203)  . insulin regular infusion 1 unit/mL 15 Units/hr (09/18/17 0624)  . dextrose 10 % with additives Pediatric IV fluid 125 mL/hr at 09/18/17 1000     LOS: 2 days    Time spent: 25 minutes   Noralee Stain, DO [facility] [email or web address] Password [email or web address] 09/18/2017, 10:36 AM

## 2017-09-19 LAB — BASIC METABOLIC PANEL
Anion gap: 8 (ref 5–15)
BUN: 6 mg/dL (ref 6–20)
CHLORIDE: 99 mmol/L (ref 98–111)
CO2: 27 mmol/L (ref 22–32)
CREATININE: 0.87 mg/dL (ref 0.61–1.24)
Calcium: 7.8 mg/dL — ABNORMAL LOW (ref 8.9–10.3)
Glucose, Bld: 143 mg/dL — ABNORMAL HIGH (ref 70–99)
POTASSIUM: 2.8 mmol/L — AB (ref 3.5–5.1)
Sodium: 134 mmol/L — ABNORMAL LOW (ref 135–145)

## 2017-09-19 LAB — TRIGLYCERIDES
TRIGLYCERIDES: 1031 mg/dL — AB (ref <150)
TRIGLYCERIDES: 871 mg/dL — AB (ref <150)

## 2017-09-19 LAB — GLUCOSE, CAPILLARY
GLUCOSE-CAPILLARY: 118 mg/dL — AB (ref 70–99)
GLUCOSE-CAPILLARY: 128 mg/dL — AB (ref 70–99)
GLUCOSE-CAPILLARY: 144 mg/dL — AB (ref 70–99)
GLUCOSE-CAPILLARY: 157 mg/dL — AB (ref 70–99)
Glucose-Capillary: 143 mg/dL — ABNORMAL HIGH (ref 70–99)
Glucose-Capillary: 159 mg/dL — ABNORMAL HIGH (ref 70–99)
Glucose-Capillary: 161 mg/dL — ABNORMAL HIGH (ref 70–99)
Glucose-Capillary: 151 mg/dL — ABNORMAL HIGH (ref 70–99)
Glucose-Capillary: 129 mg/dL — ABNORMAL HIGH (ref 70–99)
Glucose-Capillary: 138 mg/dL — ABNORMAL HIGH (ref 70–99)
Glucose-Capillary: 128 mg/dL — ABNORMAL HIGH (ref 70–99)
Glucose-Capillary: 118 mg/dL — ABNORMAL HIGH (ref 70–99)
Glucose-Capillary: 116 mg/dL — ABNORMAL HIGH (ref 70–99)

## 2017-09-19 LAB — CBC
HCT: 37.6 % — ABNORMAL LOW (ref 39.0–52.0)
HEMOGLOBIN: 12.3 g/dL — AB (ref 13.0–17.0)
MCH: 28.3 pg (ref 26.0–34.0)
MCHC: 32.7 g/dL (ref 30.0–36.0)
MCV: 86.6 fL (ref 78.0–100.0)
Platelets: 251 10*3/uL (ref 150–400)
RBC: 4.34 MIL/uL (ref 4.22–5.81)
RDW: 15.2 % (ref 11.5–15.5)
WBC: 8.8 10*3/uL (ref 4.0–10.5)

## 2017-09-19 MED ORDER — DIPHENHYDRAMINE HCL 50 MG/ML IJ SOLN: 12.5000 mg | Freq: Four times a day (QID) | INTRAMUSCULAR | Status: DC | PRN

## 2017-09-19 MED ORDER — POTASSIUM CHLORIDE 10 MEQ/100ML IV SOLN
10.0000 meq | INTRAVENOUS | Status: AC
  Administered 2017-09-19 (×6): 10 meq via INTRAVENOUS
  Filled 2017-09-19 (×2): qty 100

## 2017-09-19 MED ORDER — DIPHENHYDRAMINE HCL 12.5 MG/5ML PO ELIX: 12.5000 mg | ORAL_SOLUTION | Freq: Four times a day (QID) | ORAL | Status: DC | PRN

## 2017-09-19 MED ORDER — ONDANSETRON HCL 4 MG/2ML IJ SOLN
4.0000 mg | Freq: Four times a day (QID) | INTRAMUSCULAR | Status: DC | PRN
  Administered 2017-09-19: 4 mg via INTRAVENOUS
  Filled 2017-09-19: qty 2

## 2017-09-19 MED ORDER — SODIUM CHLORIDE 0.9% FLUSH: 9.0000 mL | INTRAVENOUS | Status: DC | PRN

## 2017-09-19 MED ORDER — HYDROMORPHONE 1 MG/ML IV SOLN
INTRAVENOUS | Status: DC
  Administered 2017-09-19: 3.6 mg via INTRAVENOUS
  Administered 2017-09-19: 3.3 mg via INTRAVENOUS
  Administered 2017-09-19: 09:00:00 via INTRAVENOUS
  Administered 2017-09-19: 2.6 mg via INTRAVENOUS
  Administered 2017-09-20: 3 mg via INTRAVENOUS
  Administered 2017-09-20: 4.1 mg via INTRAVENOUS
  Administered 2017-09-20: 2.7 mg via INTRAVENOUS
  Administered 2017-09-20: 2.4 mg via INTRAVENOUS
  Administered 2017-09-20: 2.1 mg via INTRAVENOUS
  Administered 2017-09-20: 1 mg via INTRAVENOUS
  Administered 2017-09-21: 3.3 mg via INTRAVENOUS
  Administered 2017-09-21: 1.2 mg via INTRAVENOUS
  Administered 2017-09-21: 07:00:00 via INTRAVENOUS
  Administered 2017-09-21: 1.8 mg via INTRAVENOUS
  Administered 2017-09-21: 1.2 mg via INTRAVENOUS
  Administered 2017-09-22: 1.4 mg via INTRAVENOUS
  Administered 2017-09-22: 1.8 mg via INTRAVENOUS
  Filled 2017-09-19 (×3): qty 25

## 2017-09-19 MED ORDER — DEXTROSE 50 % IV SOLN
25.0000 mL | INTRAVENOUS | Status: DC | PRN
  Administered 2017-09-19: 25 mL via INTRAVENOUS
  Administered 2017-09-20: 50 mL via INTRAVENOUS
  Administered 2017-09-20 (×2): 25 mL via INTRAVENOUS
  Filled 2017-09-19 (×4): qty 50

## 2017-09-19 MED ORDER — NALOXONE HCL 0.4 MG/ML IJ SOLN: 0.4000 mg | INTRAMUSCULAR | Status: DC | PRN

## 2017-09-19 NOTE — Progress Notes (Addendum)
PROGRESS NOTE    Douglas [name].  ZOX:[id number] DOB: 1985-10-23 DOA: 09/15/2017 PCP: System, Pcp Not In     Brief Narrative:  Douglas [name]. is a 32 yo with past medical history significant for essential hypertension, hypercholesterolemia, prediabetes who is not on any current medications at home who started having abdominal pain.  He admitted to burning epigastric discomfort radiating to his back, with nausea and vomiting.  He denies any alcohol use other than social use couple times monthly.  In the emergency department, he was found to have elevated lipase, CT scan positive for pancreatitis.  Right upper quadrant ultrasound was negative for gallstones. Triglycerides over 5000, hemoglobin A1c 8.9. He was started on IV insulin for hypertriglyceridemia-associated pancreatitis.   New events last 24 hours / Subjective: Repeat CT yesterday revealed worsening pancreatitis without necrosis or abscess. This morning, due to continued pain, he was started on PCA pump. He continues to complain of epigastric abdominal pain with minimal relief, also some lower pelvic pain. He is passing gas, no BM. No further nausea, vomiting.   Assessment & Plan:   Principal Problem:   Acute pancreatitis Active Problems:   Abdominal pain   Leukocytosis   Hyponatremia   Abnormal LFTs   Obesity, Class III, BMI 40-49.9 (morbid obesity) (HCC)   Hypertension   Pancreatitis   Acute pancreatitis without infection or necrosis  Acute hypertriglyceridemia associated pancreatitis -Continue IV insulin until triglyceride less than 500. TG trending down  -Continue blood sugar monitoring -Continue D10, half-normal saline, avoid hypoglycemia  -Continue Lipitor, fenofibrate -Continue pain control, antiemetic. Started on PCA pump today.  -CT abdomen report worsening pancreatitis, adynamic ileus. Lipase trending down. Spoke with Dr. Matthias Hughs on-call [facility] and discussed case curbside. For now, we will hold off  antibiotics as there was no sign of necrosis or abscess on imaging. Increase IVF.   Fever -Blood culture pending   Newly diagnosed diabetes mellitus -Hemoglobin A1c 8.9 -Continue IV insulin for now, he will need oral hypoglycemic on discharge -DM coordinator consult   Hypokalemia -Replace, trend    DVT prophylaxis: Lovenox Code Status: Full Family Communication: Mother at bedside  Disposition Plan: Pending improvement   Consultants:   None  Procedures:   None   Antimicrobials:  Anti-infectives (From admission, onward)   None       Objective: Vitals:   09/19/17 0700 09/19/17 0800 09/19/17 0847 09/19/17 1203  BP: (!) 160/96 (!) 158/102  (!) 149/78  Pulse: (!) 106     Resp: (!) 24 (!) 28 19 (!) 22  Temp:  99 F (37.2 C)    TempSrc:  Oral    SpO2: 96%     Weight:      Height:        Intake/Output Summary (Last 24 hours) at 09/19/2017 1210 Last data filed at 09/19/2017 1100 Gross per 24 hour  Intake 4293.47 ml  Output 2250 ml  Net 2043.47 ml   Filed Weights   09/15/17 1944 09/16/17 1000  Weight: (!) 149.7 kg (330 lb) (!) 149.4 kg (329 lb 5.9 oz)    Examination: General exam: Appears calm and uncomfortable  Respiratory system: Clear to auscultation. Respiratory effort normal. Cardiovascular system: S1 & S2 heard, Tachycardic, regular rhythm. No JVD, murmurs, rubs, gallops or clicks. No pedal edema. Gastrointestinal system: Abdomen is mildly distended, soft and TTP epigastric. No organomegaly or masses felt. Normal bowel sounds heard. Central nervous system: Alert and oriented. No focal neurological deficits. Extremities: Symmetric 5  x 5 power. Skin: No rashes, lesions or ulcers Psychiatry: Judgement and insight appear normal. Mood & affect appropriate.    Data Reviewed: I have personally reviewed following labs and imaging studies  CBC: Recent Labs  Lab 09/15/17 2020 09/16/17 0544 09/17/17 0511 09/18/17 0514 09/19/17 0342  WBC 13.3* 12.6* 12.2*  10.7* 8.8  HGB 17.2* 15.1 16.6 14.7 12.3*  HCT 46.3 42.0 47.6 42.5 37.6*  MCV 83.3 84.7 86.5 85.5 86.6  PLT 254 240 245 247 251   Basic Metabolic Panel: Recent Labs  Lab 09/16/17 2247 09/17/17 0511 09/17/17 1251 09/18/17 0514 09/19/17 0342  NA 133* 131* 129* 129* 134*  K 4.5 4.3 4.1 4.1 2.8*  CL 99 98 97* 97* 99  CO2 18* 21* 22 24 27   GLUCOSE 193* 156* 213* 169* 143*  BUN 7 10 10 7 6   CREATININE 1.01 1.16 1.17 1.07 0.87  CALCIUM 7.5* 7.4* 7.2* 7.7* 7.8*   GFR: Estimated Creatinine Clearance: 185 mL/min (by C-G formula based on SCr of 0.87 mg/dL). Liver Function Tests: Recent Labs  Lab 09/15/17 2020 09/16/17 0544 09/18/17 1651  AST 82* 61* 31  ALT 72* 64* 26  ALKPHOS 128* 81 55  BILITOT 2.3* 1.7* 0.9  PROT 8.0 7.3 7.1  ALBUMIN 4.6 4.2 3.0*   Recent Labs  Lab 09/15/17 2020 09/18/17 1651  LIPASE 145* 62*   No results for input(s): AMMONIA in the last 168 hours. Coagulation Profile: No results for input(s): INR, PROTIME in the last 168 hours. Cardiac Enzymes: No results for input(s): CKTOTAL, CKMB, CKMBINDEX, TROPONINI in the last 168 hours. BNP (last 3 results) No results for input(s): PROBNP in the last 8760 hours. HbA1C: No results for input(s): HGBA1C in the last 72 hours. CBG: Recent Labs  Lab 09/19/17 0028 09/19/17 0206 09/19/17 0346 09/19/17 0746 09/19/17 1004  GLUCAP 118* 128* 143* 159* 161*   Lipid Profile: Recent Labs    09/18/17 1651 09/19/17 0342  TRIG 1,476* 1,031*   Thyroid Function Tests: No results for input(s): TSH, T4TOTAL, FREET4, T3FREE, THYROIDAB in the last 72 hours. Anemia Panel: No results for input(s): VITAMINB12, FOLATE, FERRITIN, TIBC, IRON, RETICCTPCT in the last 72 hours. Sepsis Labs: No results for input(s): PROCALCITON, LATICACIDVEN in the last 168 hours.  Recent Results (from the past 240 hour(s))  MRSA PCR Screening     Status: None   Collection Time: 09/16/17  9:43 AM  Result Value Ref Range Status   MRSA  by PCR NEGATIVE NEGATIVE Final    Comment:        The GeneXpert MRSA Assay (FDA approved for NASAL specimens only), is one component of a comprehensive MRSA colonization surveillance program. It is not intended to diagnose MRSA infection nor to guide or monitor treatment for MRSA infections. Performed at [facility], [address] [place], [place] [address]        Radiology Studies: Ct Abdomen W Contrast  Result Date: 09/18/2017 CLINICAL DATA:  Follow-up acute pancreatitis. Fever, abdominal pain, and leukocytosis. EXAM: CT ABDOMEN WITH CONTRAST TECHNIQUE: Multidetector CT imaging of the abdomen was performed using the standard protocol following bolus administration of intravenous contrast. CONTRAST:  ISOVUE-300 IOPAMIDOL (ISOVUE-300) INJECTION 61% COMPARISON:  09/16/2017 FINDINGS: Lower chest: Increased dependent atelectasis in both lung bases as well as tiny left pleural effusion. Hepatobiliary: No hepatic masses identified. Severe hepatic steatosis again seen. High attenuation in the gallbladder likely represents excreted contrast from recent CT. No evidence of cholecystitis or biliary ductal dilatation. Pancreas:  Acute pancreatitis is again demonstrated, with increased peripancreatic inflammatory changes and small amount of retroperitoneal and intraperitoneal fluid extending inferiorly into the right lower quadrant. No evidence of pancreatic necrosis or neoplasm. No pseudocysts identified. No evidence of pancreatic mass or ductal dilatation. Spleen:  Within normal limits in size and appearance. Adrenals/Urinary Tract: No masses identified. No evidence of hydronephrosis. Stomach/Bowel: Moderately dilated small bowel and transverse colon, consistent with adynamic ileus. Wall thickening of duodenum shows no significant change and is likely secondary to acute pancreatitis. Vascular/Lymphatic: No pathologically enlarged lymph nodes identified. No abdominal aortic  aneurysm. Other:  None. Musculoskeletal:  No suspicious bone lesions identified. IMPRESSION: Worsening acute pancreatitis, with increased peripancreatic inflammatory changes and small amount of free fluid. No evidence of pancreatic necrosis or pseudocysts. Worsening adynamic ileus. Increased bilateral lower lobe atelectasis and tiny left pleural effusion. Severe hepatic steatosis. Electronically Signed   By: Myles RosenthalJohn  Stahl M.D.   On: 09/18/2017 13:46      Scheduled Meds: . atorvastatin  40 mg Oral q1800  . enoxaparin (LOVENOX) injection  60 mg Subcutaneous Q24H  . fenofibrate  160 mg Oral Daily  . HYDROmorphone   Intravenous Q4H   Continuous Infusions: . sodium chloride 250 mL (09/19/17 0935)  . insulin regular infusion 1 unit/mL 10 Units/hr (09/19/17 0736)  . potassium chloride 10 mEq (09/19/17 1158)  . dextrose 10 % with additives Pediatric IV fluid 150 mL/hr at 09/19/17 0738     LOS: 3 days    Time spent: 25 minutes   Noralee StainJennifer Shaymus Eveleth, DO [facility] [email or web address] Password [email or web address] 09/19/2017, 12:10 PM

## 2017-09-19 NOTE — Progress Notes (Signed)
Pt had bs of 128 at 0206. MD aware.

## 2017-09-19 NOTE — Progress Notes (Signed)
Pt had bs of 143 at 0346. MD aware

## 2017-09-20 LAB — CBC
HCT: 36.6 % — ABNORMAL LOW (ref 39.0–52.0)
HEMOGLOBIN: 11.9 g/dL — AB (ref 13.0–17.0)
MCH: 28.4 pg (ref 26.0–34.0)
MCHC: 32.5 g/dL (ref 30.0–36.0)
MCV: 87.4 fL (ref 78.0–100.0)
Platelets: 254 10*3/uL (ref 150–400)
RBC: 4.19 MIL/uL — ABNORMAL LOW (ref 4.22–5.81)
RDW: 15.6 % — AB (ref 11.5–15.5)
WBC: 9.6 10*3/uL (ref 4.0–10.5)

## 2017-09-20 LAB — GLUCOSE, CAPILLARY
GLUCOSE-CAPILLARY: 136 mg/dL — AB (ref 70–99)
GLUCOSE-CAPILLARY: 144 mg/dL — AB (ref 70–99)
GLUCOSE-CAPILLARY: 138 mg/dL — AB (ref 70–99)
GLUCOSE-CAPILLARY: 113 mg/dL — AB (ref 70–99)
GLUCOSE-CAPILLARY: 120 mg/dL — AB (ref 70–99)
Glucose-Capillary: 145 mg/dL — ABNORMAL HIGH (ref 70–99)
Glucose-Capillary: 194 mg/dL — ABNORMAL HIGH (ref 70–99)
Glucose-Capillary: 166 mg/dL — ABNORMAL HIGH (ref 70–99)
Glucose-Capillary: 107 mg/dL — ABNORMAL HIGH (ref 70–99)
Glucose-Capillary: 146 mg/dL — ABNORMAL HIGH (ref 70–99)
Glucose-Capillary: 184 mg/dL — ABNORMAL HIGH (ref 70–99)

## 2017-09-20 LAB — BASIC METABOLIC PANEL
Anion gap: 8 (ref 5–15)
BUN: 5 mg/dL — AB (ref 6–20)
CALCIUM: 8.1 mg/dL — AB (ref 8.9–10.3)
CHLORIDE: 101 mmol/L (ref 98–111)
CO2: 26 mmol/L (ref 22–32)
CREATININE: 0.85 mg/dL (ref 0.61–1.24)
GFR calc Af Amer: >60 mL/min (ref >60)
GFR calc non Af Amer: >60 mL/min (ref >60)
Glucose, Bld: 146 mg/dL — ABNORMAL HIGH (ref 70–99)
Potassium: 3 mmol/L — ABNORMAL LOW (ref 3.5–5.1)
SODIUM: 135 mmol/L (ref 135–145)

## 2017-09-20 LAB — TRIGLYCERIDES
Triglycerides: 685 mg/dL — ABNORMAL HIGH (ref <150)
Triglycerides: 584 mg/dL — ABNORMAL HIGH (ref <150)

## 2017-09-20 LAB — MAGNESIUM: Magnesium: 2.3 mg/dL (ref 1.7–2.4)

## 2017-09-20 MED ORDER — POTASSIUM CHLORIDE 10 MEQ/100ML IV SOLN
10.0000 meq | INTRAVENOUS | Status: AC
  Administered 2017-09-20 (×6): 10 meq via INTRAVENOUS
  Filled 2017-09-20 (×5): qty 100

## 2017-09-20 MED ORDER — SALINE SPRAY 0.65 % NA SOLN
1.0000 | NASAL | Status: DC | PRN
  Administered 2017-09-20: 1 via NASAL
  Filled 2017-09-20: qty 44

## 2017-09-20 MED ORDER — IBUPROFEN 200 MG PO TABS
400.0000 mg | ORAL_TABLET | Freq: Once | ORAL | Status: AC
  Administered 2017-09-20: 400 mg via ORAL
  Filled 2017-09-20: qty 2

## 2017-09-20 NOTE — Progress Notes (Signed)
  [facility] notified of pt temp 102.1 and BG 144 under goal of 150.  Orders for ibuprofen and to decrease insulin drip from 10 to 8 units. Will continue to monitor q2h.

## 2017-09-20 NOTE — Progress Notes (Signed)
PROGRESS NOTE    Douglas [name].  NFA:[id number] DOB: 05-07-1985 DOA: 09/15/2017 PCP: System, Pcp Not In     Brief Narrative:  Douglas [name]. is a 32 yo with past medical history significant for essential hypertension, hypercholesterolemia, prediabetes who is not on any current medications at home who started having abdominal pain.  He admitted to burning epigastric discomfort radiating to his back, with nausea and vomiting.  He denies any alcohol use other than social use couple times monthly.  In the emergency department, he was found to have elevated lipase, CT scan positive for pancreatitis.  Right upper quadrant ultrasound was negative for gallstones. Triglycerides over 5000, hemoglobin A1c 8.9. He was started on IV insulin for hypertriglyceridemia-associated pancreatitis.   New events last 24 hours / Subjective: Pain much better controlled on PCA pump. No appetite and no nausea, vomiting. Having some lower abdominal sharp pain with abdominal distention. Passing lots of gas. No BM.   Assessment & Plan:   Principal Problem:   Acute pancreatitis Active Problems:   Abdominal pain   Leukocytosis   Hyponatremia   Abnormal LFTs   Obesity, Class III, BMI 40-49.9 (morbid obesity) (HCC)   Hypertension   Pancreatitis   Acute pancreatitis without infection or necrosis  Acute hypertriglyceridemia associated pancreatitis -Continue IV insulin until triglyceride less than 500. TG trending down  -Continue blood sugar monitoring -Continue D10, half-normal saline, avoid hypoglycemia  -Continue Lipitor, fenofibrate -Continue pain control, antiemetic. Started on PCA pump 8/4.  -CT abdomen report worsening pancreatitis, adynamic ileus. Lipase trending down. Spoke with Dr. Matthias Hughs on-call [facility] over the weekend and discussed case curbside. For now, we will hold off antibiotics as there was no sign of necrosis or abscess on imaging   Fever -Blood culture negative to date -WBC  normal  -Tylenol, ibuprofen prn   Newly diagnosed diabetes mellitus -Hemoglobin A1c 8.9 -Continue IV insulin for now, he will need oral hypoglycemic on discharge -DM coordinator consult   Hypokalemia -Replace, trend  -Mg normal    DVT prophylaxis: Lovenox Code Status: Full Family Communication: No family at bedside  Disposition Plan: Pending improvement   Consultants:   None  Procedures:   None   Antimicrobials:  Anti-infectives (From admission, onward)   None       Objective: Vitals:   09/20/17 0400 09/20/17 0500 09/20/17 0600 09/20/17 0800  BP: (!) 177/97 (!) 164/90    Pulse: (!) 111 (!) 105 94   Resp: 17 (!) 27  (!) 24  Temp:    98.7 F (37.1 C)  TempSrc:    Oral  SpO2: 97% 99% 98% 99%  Weight:      Height:        Intake/Output Summary (Last 24 hours) at 09/20/2017 0921 Last data filed at 09/20/2017 0358 Gross per 24 hour  Intake 3593.27 ml  Output 1200 ml  Net 2393.27 ml   Filed Weights   09/15/17 1944 09/16/17 1000  Weight: (!) 149.7 kg (330 lb) (!) 149.4 kg (329 lb 5.9 oz)    Examination: General exam: Appears calm and comfortable  Respiratory system: Clear to auscultation. Respiratory effort normal. Cardiovascular system: S1 & S2 heard, tachycardic, regular rhythm. No JVD, murmurs, rubs, gallops or clicks. No pedal edema. Gastrointestinal system: Abdomen is mildly distended, soft and TTP lower quadrants, epigastric tenderness has improved on PCA pump. No organomegaly or masses felt. Normal bowel sounds heard. Central nervous system: Alert and oriented. No focal neurological deficits. Extremities: Symmetric 5  x 5 power. Skin: No rashes, lesions or ulcers Psychiatry: Judgement and insight appear normal. Mood & affect appropriate.    Data Reviewed: I have personally reviewed following labs and imaging studies  CBC: Recent Labs  Lab 09/16/17 0544 09/17/17 0511 09/18/17 0514 09/19/17 0342 09/20/17 0355  WBC 12.6* 12.2* 10.7* 8.8 9.6  HGB  15.1 16.6 14.7 12.3* 11.9*  HCT 42.0 47.6 42.5 37.6* 36.6*  MCV 84.7 86.5 85.5 86.6 87.4  PLT 240 245 247 251 254   Basic Metabolic Panel: Recent Labs  Lab 09/17/17 0511 09/17/17 1251 09/18/17 0514 09/19/17 0342 09/20/17 0355  NA 131* 129* 129* 134* 135  K 4.3 4.1 4.1 2.8* 3.0*  CL 98 97* 97* 99 101  CO2 21* 22 24 27 26   GLUCOSE 156* 213* 169* 143* 146*  BUN 10 10 7 6  5*  CREATININE 1.16 1.17 1.07 0.87 0.85  CALCIUM 7.4* 7.2* 7.7* 7.8* 8.1*  MG  --   --   --   --  2.3   GFR: Estimated Creatinine Clearance: 189.3 mL/min (by C-G formula based on SCr of 0.85 mg/dL). Liver Function Tests: Recent Labs  Lab 09/15/17 2020 09/16/17 0544 09/18/17 1651  AST 82* 61* 31  ALT 72* 64* 26  ALKPHOS 128* 81 55  BILITOT 2.3* 1.7* 0.9  PROT 8.0 7.3 7.1  ALBUMIN 4.6 4.2 3.0*   Recent Labs  Lab 09/15/17 2020 09/18/17 1651  LIPASE 145* 62*   No results for input(s): AMMONIA in the last 168 hours. Coagulation Profile: No results for input(s): INR, PROTIME in the last 168 hours. Cardiac Enzymes: No results for input(s): CKTOTAL, CKMB, CKMBINDEX, TROPONINI in the last 168 hours. BNP (last 3 results) No results for input(s): PROBNP in the last 8760 hours. HbA1C: No results for input(s): HGBA1C in the last 72 hours. CBG: Recent Labs  Lab 09/19/17 2334 09/20/17 0202 09/20/17 0354 09/20/17 0615 09/20/17 0751  GLUCAP 144* 145* 136* 194* 166*   Lipid Profile: Recent Labs    09/19/17 1641 09/20/17 0355  TRIG 871* 685*   Thyroid Function Tests: No results for input(s): TSH, T4TOTAL, FREET4, T3FREE, THYROIDAB in the last 72 hours. Anemia Panel: No results for input(s): VITAMINB12, FOLATE, FERRITIN, TIBC, IRON, RETICCTPCT in the last 72 hours. Sepsis Labs: No results for input(s): PROCALCITON, LATICACIDVEN in the last 168 hours.  Recent Results (from the past 240 hour(s))  MRSA PCR Screening     Status: None   Collection Time: 09/16/17  9:43 AM  Result Value Ref Range  Status   MRSA by PCR NEGATIVE NEGATIVE Final    Comment:        The GeneXpert MRSA Assay (FDA approved for NASAL specimens only), is one component of a comprehensive MRSA colonization surveillance program. It is not intended to diagnose MRSA infection nor to guide or monitor treatment for MRSA infections. Performed at [facility], [address] [place], [place] [address]   Culture, blood (routine x 2)     Status: None (Preliminary result)   Collection Time: 09/18/17  8:09 AM  Result Value Ref Range Status   Specimen Description   Final    BLOOD RIGHT HAND Performed at [facility], [address] [place], [place] [address]    Special Requests   Final    BOTTLES DRAWN AEROBIC ONLY Blood Culture adequate volume Performed at [facility], [address] [place], [place] [address]    Culture   Final  NO GROWTH 1 DAY Performed at [facility], [address], [place], [place] [address]    Report Status PENDING  Incomplete  Culture, blood (routine x 2)     Status: None (Preliminary result)   Collection Time: 09/18/17  8:09 AM  Result Value Ref Range Status   Specimen Description   Final    BLOOD LEFT ARM Performed at [facility], [address], [place], [place] [address]    Special Requests   Final    BOTTLES DRAWN AEROBIC ONLY Blood Culture adequate volume Performed at [facility], [address], [place], [place] [address]    Culture   Final    NO GROWTH 1 DAY Performed at [facility], [address] [place], [place] [address]    Report Status PENDING  Incomplete       Radiology Studies: Ct Abdomen W Contrast  Result Date: 09/18/2017 CLINICAL DATA:  Follow-up acute pancreatitis. Fever, abdominal pain, and leukocytosis. EXAM: CT ABDOMEN WITH CONTRAST TECHNIQUE: Multidetector CT imaging of the abdomen was performed using the standard protocol following  bolus administration of intravenous contrast. CONTRAST:  ISOVUE-300 IOPAMIDOL (ISOVUE-300) INJECTION 61% COMPARISON:  09/16/2017 FINDINGS: Lower chest: Increased dependent atelectasis in both lung bases as well as tiny left pleural effusion. Hepatobiliary: No hepatic masses identified. Severe hepatic steatosis again seen. High attenuation in the gallbladder likely represents excreted contrast from recent CT. No evidence of cholecystitis or biliary ductal dilatation. Pancreas: Acute pancreatitis is again demonstrated, with increased peripancreatic inflammatory changes and small amount of retroperitoneal and intraperitoneal fluid extending inferiorly into the right lower quadrant. No evidence of pancreatic necrosis or neoplasm. No pseudocysts identified. No evidence of pancreatic mass or ductal dilatation. Spleen:  Within normal limits in size and appearance. Adrenals/Urinary Tract: No masses identified. No evidence of hydronephrosis. Stomach/Bowel: Moderately dilated small bowel and transverse colon, consistent with adynamic ileus. Wall thickening of duodenum shows no significant change and is likely secondary to acute pancreatitis. Vascular/Lymphatic: No pathologically enlarged lymph nodes identified. No abdominal aortic aneurysm. Other:  None. Musculoskeletal:  No suspicious bone lesions identified. IMPRESSION: Worsening acute pancreatitis, with increased peripancreatic inflammatory changes and small amount of free fluid. No evidence of pancreatic necrosis or pseudocysts. Worsening adynamic ileus. Increased bilateral lower lobe atelectasis and tiny left pleural effusion. Severe hepatic steatosis. Electronically Signed   By: Myles Rosenthal M.D.   On: 09/18/2017 13:46      Scheduled Meds: . atorvastatin  40 mg Oral q1800  . enoxaparin (LOVENOX) injection  60 mg Subcutaneous Q24H  . fenofibrate  160 mg Oral Daily  . HYDROmorphone   Intravenous Q4H   Continuous Infusions: . sodium chloride 250 mL  (09/20/17 0849)  . insulin regular infusion 1 unit/mL 5 Units/hr (09/20/17 0447)  . potassium chloride 10 mEq (09/20/17 0851)  . dextrose 10 % with additives Pediatric IV fluid 150 mL/hr at 09/20/17 0400     LOS: 4 days    Time spent: 25 minutes   Noralee Stain, DO [facility] [email or web address] Password [email or web address] 09/20/2017, 9:21 AM

## 2017-09-20 NOTE — Progress Notes (Signed)
BG 145, still under goal of 150. [place] notified.

## 2017-09-20 NOTE — Progress Notes (Signed)
BG 136, will decrease insulin to 5u

## 2017-09-20 NOTE — Progress Notes (Signed)
MD made aware that patient's CBG was 107. RN instructed to administer 50 mL of dextrose 50% solution.  Will continue to monitor.

## 2017-09-21 LAB — BASIC METABOLIC PANEL
ANION GAP: 9 (ref 5–15)
BUN: 5 mg/dL — AB (ref 6–20)
CO2: 28 mmol/L (ref 22–32)
Calcium: 9.1 mg/dL (ref 8.9–10.3)
Chloride: 101 mmol/L (ref 98–111)
Creatinine, Ser: 0.89 mg/dL (ref 0.61–1.24)
Glucose, Bld: 233 mg/dL — ABNORMAL HIGH (ref 70–99)
Potassium: 3.3 mmol/L — ABNORMAL LOW (ref 3.5–5.1)
Sodium: 138 mmol/L (ref 135–145)

## 2017-09-21 LAB — GLUCOSE, CAPILLARY
GLUCOSE-CAPILLARY: 197 mg/dL — AB (ref 70–99)
GLUCOSE-CAPILLARY: 227 mg/dL — AB (ref 70–99)
Glucose-Capillary: 200 mg/dL — ABNORMAL HIGH (ref 70–99)
Glucose-Capillary: 220 mg/dL — ABNORMAL HIGH (ref 70–99)
Glucose-Capillary: 233 mg/dL — ABNORMAL HIGH (ref 70–99)
Glucose-Capillary: 279 mg/dL — ABNORMAL HIGH (ref 70–99)
Glucose-Capillary: 190 mg/dL — ABNORMAL HIGH (ref 70–99)
Glucose-Capillary: 222 mg/dL — ABNORMAL HIGH (ref 70–99)

## 2017-09-21 LAB — CBC
HEMATOCRIT: 38.2 % — AB (ref 39.0–52.0)
Hemoglobin: 12.2 g/dL — ABNORMAL LOW (ref 13.0–17.0)
MCH: 28.1 pg (ref 26.0–34.0)
MCHC: 31.9 g/dL (ref 30.0–36.0)
MCV: 88 fL (ref 78.0–100.0)
PLATELETS: 302 10*3/uL (ref 150–400)
RBC: 4.34 MIL/uL (ref 4.22–5.81)
RDW: 15.8 % — AB (ref 11.5–15.5)
WBC: 11.6 10*3/uL — AB (ref 4.0–10.5)

## 2017-09-21 LAB — MAGNESIUM: MAGNESIUM: 2.3 mg/dL (ref 1.7–2.4)

## 2017-09-21 LAB — TRIGLYCERIDES: Triglycerides: 486 mg/dL — ABNORMAL HIGH (ref <150)

## 2017-09-21 MED ORDER — SODIUM CHLORIDE 0.9 % IV SOLN
INTRAVENOUS | Status: DC
  Administered 2017-09-21 – 2017-09-22 (×4): via INTRAVENOUS

## 2017-09-21 MED ORDER — POTASSIUM CHLORIDE CRYS ER 20 MEQ PO TBCR
40.0000 meq | EXTENDED_RELEASE_TABLET | Freq: Once | ORAL | Status: AC
  Administered 2017-09-21: 40 meq via ORAL
  Filled 2017-09-21: qty 2

## 2017-09-21 MED ORDER — OXYCODONE HCL 5 MG PO TABS
5.0000 mg | ORAL_TABLET | Freq: Once | ORAL | Status: AC
  Administered 2017-09-21: 5 mg via ORAL
  Filled 2017-09-21: qty 1

## 2017-09-21 MED ORDER — INSULIN ASPART 100 UNIT/ML ~~LOC~~ SOLN
0.0000 [IU] | SUBCUTANEOUS | Status: DC
  Administered 2017-09-21 (×2): 3 [IU] via SUBCUTANEOUS
  Administered 2017-09-21: 5 [IU] via SUBCUTANEOUS
  Administered 2017-09-21 – 2017-09-22 (×2): 2 [IU] via SUBCUTANEOUS
  Administered 2017-09-22: 1 [IU] via SUBCUTANEOUS
  Administered 2017-09-22: 3 [IU] via SUBCUTANEOUS
  Administered 2017-09-22: 2 [IU] via SUBCUTANEOUS

## 2017-09-21 MED ORDER — POTASSIUM CHLORIDE 10 MEQ/100ML IV SOLN: 10.0000 meq | INTRAVENOUS | Status: DC

## 2017-09-21 NOTE — Progress Notes (Signed)
Inpatient Diabetes Program Recommendations  AACE/ADA: New Consensus Statement on Inpatient Glycemic Control (2015)  Target Ranges:  Prepandial:   less than 140 mg/dL      Peak postprandial:   less than 180 mg/dL (1-2 hours)      Critically ill patients:  140 - 180 mg/dL   Lab Results  Component Value Date   GLUCAP 279 (H) 09/21/2017   HGBA1C 8.9 (H) 09/16/2017    Review of Glycemic Control  Stopped by to answer any questions he may have about his new diagnosis of DM. He is very open to going home on insulin (possibly basal + OHAs). Will assess blood sugars tonight and make recs for discharge in am.  Will need glucose meter and supplies prescription Will order OP Diabetes Education consult.  Pt seems motivated to make lifestyle changes to control his blood sugars.  Will f/u in am.  Thank you. Ailene Ardshonda Myrene Bougher, RD, LDN, CDE Inpatient Diabetes Coordinator [phone]

## 2017-09-21 NOTE — Progress Notes (Signed)
PROGRESS NOTE    Douglas [name].  ZOX:[id number] DOB: Jul 08, 1985 DOA: 09/15/2017 PCP: System, Pcp Not In     Brief Narrative:  Douglas [name]. is a 32 yo with past medical history significant for essential hypertension, hypercholesterolemia, prediabetes who is not on any current medications at home who started having abdominal pain.  He admitted to burning epigastric discomfort radiating to his back, with nausea and vomiting.  He denies any alcohol use other than social use couple times monthly.  In the emergency department, he was found to have elevated lipase, CT scan positive for pancreatitis.  Right upper quadrant ultrasound was negative for gallstones. Triglycerides over 5000, hemoglobin A1c 8.9. He was started on IV insulin for hypertriglyceridemia-associated pancreatitis. With IV insulin treatment, his TG improved to < 500. Insulin drip stopped 8/5.   New events last 24 hours / Subjective: Pain continues to improve. Still on PCA pump. No nausea, vomiting. Lower abdominal pain also improved. Passing gas and having urine output.   Assessment & Plan:   Principal Problem:   Acute pancreatitis Active Problems:   Abdominal pain   Leukocytosis   Hyponatremia   Abnormal LFTs   Obesity, Class III, BMI 40-49.9 (morbid obesity) (HCC)   Hypertension   Pancreatitis   Acute pancreatitis without infection or necrosis  Acute hypertriglyceridemia associated pancreatitis -Treated with IV insulin and TG improved greater than 5000 to less than 500. Insulin drip stopped.   -Continue Lipitor, fenofibrate -Continue pain control, antiemetic. Started on PCA pump 8/4.  -CT abdomen report worsening pancreatitis, adynamic ileus. Lipase trending down. Spoke with Dr. Matthias Hughs on-call [name] 8/3 and discussed case curbside. For now, we will hold off antibiotics as there was no sign of necrosis or abscess on imaging  -Pain slightly improved, overall feeling better. Trial clear liquid diet  today.   Fever -Blood culture negative to date -Tylenol, ibuprofen prn   Newly diagnosed diabetes mellitus -Hemoglobin A1c 8.9 -DM coordinator consult  -SSI started   Hypokalemia -Replace, trend  -Mg normal    DVT prophylaxis: Lovenox Code Status: Full Family Communication: No family at bedside  Disposition Plan: Pending improvement. Transfer out of stepdown unit today    Consultants:   None  Procedures:   None   Antimicrobials:  Anti-infectives (From admission, onward)   None       Objective: Vitals:   09/21/17 0600 09/21/17 0635 09/21/17 0700 09/21/17 0800  BP:    (!) 173/93  Pulse: (!) 102  (!) 102 (!) 107  Resp: 16 20  17   Temp:      TempSrc:      SpO2: 97% 98% 98% 98%  Weight:      Height:        Intake/Output Summary (Last 24 hours) at 09/21/2017 0859 Last data filed at 09/21/2017 0853 Gross per 24 hour  Intake 5274.84 ml  Output 2850 ml  Net 2424.84 ml   Filed Weights   09/15/17 1944 09/16/17 1000  Weight: (!) 149.7 kg (330 lb) (!) 149.4 kg (329 lb 5.9 oz)    Examination: General exam: Appears calm and comfortable  Respiratory system: Clear to auscultation. Respiratory effort normal. Cardiovascular system: S1 & S2 heard, tachycardic, regular rhythm. No JVD, murmurs, rubs, gallops or clicks. No pedal edema. Gastrointestinal system: Abdomen is mildly distended, soft and minimal TTP epigastric. No organomegaly or masses felt.  Central nervous system: Alert and oriented. No focal neurological deficits. Extremities: Symmetric 5 x 5 power. Skin: No rashes,  lesions or ulcers Psychiatry: Judgement and insight appear normal. Mood & affect appropriate.    Data Reviewed: I have personally reviewed following labs and imaging studies  CBC: Recent Labs  Lab 09/17/17 0511 09/18/17 0514 09/19/17 0342 09/20/17 0355 09/21/17 0329  WBC 12.2* 10.7* 8.8 9.6 11.6*  HGB 16.6 14.7 12.3* 11.9* 12.2*  HCT 47.6 42.5 37.6* 36.6* 38.2*  MCV 86.5 85.5 86.6  87.4 88.0  PLT 245 247 251 254 302   Basic Metabolic Panel: Recent Labs  Lab 09/17/17 1251 09/18/17 0514 09/19/17 0342 09/20/17 0355 09/21/17 0329  NA 129* 129* 134* 135 138  K 4.1 4.1 2.8* 3.0* 3.3*  CL 97* 97* 99 101 101  CO2 22 24 27 26 28   GLUCOSE 213* 169* 143* 146* 233*  BUN 10 7 6  5* 5*  CREATININE 1.17 1.07 0.87 0.85 0.89  CALCIUM 7.2* 7.7* 7.8* 8.1* 9.1  MG  --   --   --  2.3 2.3   GFR: Estimated Creatinine Clearance: 180.8 mL/min (by C-G formula based on SCr of 0.89 mg/dL). Liver Function Tests: Recent Labs  Lab 09/15/17 2020 09/16/17 0544 09/18/17 1651  AST 82* 61* 31  ALT 72* 64* 26  ALKPHOS 128* 81 55  BILITOT 2.3* 1.7* 0.9  PROT 8.0 7.3 7.1  ALBUMIN 4.6 4.2 3.0*   Recent Labs  Lab 09/15/17 2020 09/18/17 1651  LIPASE 145* 62*   No results for input(s): AMMONIA in the last 168 hours. Coagulation Profile: No results for input(s): INR, PROTIME in the last 168 hours. Cardiac Enzymes: No results for input(s): CKTOTAL, CKMB, CKMBINDEX, TROPONINI in the last 168 hours. BNP (last 3 results) No results for input(s): PROBNP in the last 8760 hours. HbA1C: No results for input(s): HGBA1C in the last 72 hours. CBG: Recent Labs  Lab 09/21/17 0011 09/21/17 0210 09/21/17 0353 09/21/17 0629 09/21/17 0809  GLUCAP 197* 200* 220* 233* 227*   Lipid Profile: Recent Labs    09/20/17 1604 09/21/17 0329  TRIG 584* 486*   Thyroid Function Tests: No results for input(s): TSH, T4TOTAL, FREET4, T3FREE, THYROIDAB in the last 72 hours. Anemia Panel: No results for input(s): VITAMINB12, FOLATE, FERRITIN, TIBC, IRON, RETICCTPCT in the last 72 hours. Sepsis Labs: No results for input(s): PROCALCITON, LATICACIDVEN in the last 168 hours.  Recent Results (from the past 240 hour(s))  MRSA PCR Screening     Status: None   Collection Time: 09/16/17  9:43 AM  Result Value Ref Range Status   MRSA by PCR NEGATIVE NEGATIVE Final    Comment:        The GeneXpert MRSA  Assay (FDA approved for NASAL specimens only), is one component of a comprehensive MRSA colonization surveillance program. It is not intended to diagnose MRSA infection nor to guide or monitor treatment for MRSA infections. Performed at [facility], [address], [place], [place] [address]   Culture, blood (routine x 2)     Status: None (Preliminary result)   Collection Time: 09/18/17  8:09 AM  Result Value Ref Range Status   Specimen Description   Final    BLOOD RIGHT HAND Performed at [facility], [address], [place], [place] [address]    Special Requests   Final    BOTTLES DRAWN AEROBIC ONLY Blood Culture adequate volume Performed at [facility], [address], [place], [place] [address]    Culture   Final    NO GROWTH 2 DAYS Performed at [facility], [address] [place], [place] [address]    Report Status PENDING  Incomplete  Culture, blood (routine x 2)     Status: None (Preliminary result)   Collection Time: 09/18/17  8:09 AM  Result Value Ref Range Status   Specimen Description   Final    BLOOD LEFT ARM Performed at [facility], [address] [place], [place] [address]    Special Requests   Final    BOTTLES DRAWN AEROBIC ONLY Blood Culture adequate volume Performed at [facility], [address] [place], [place] [address]    Culture   Final    NO GROWTH 2 DAYS Performed at [facility], [address] [place], [place] [address]    Report Status PENDING  Incomplete       Radiology Studies: No results found.    Scheduled Meds: . atorvastatin  40 mg Oral q1800  . enoxaparin (LOVENOX) injection  60 mg Subcutaneous Q24H  . fenofibrate  160 mg Oral Daily  . HYDROmorphone   Intravenous Q4H  . insulin aspart  0-9 Units Subcutaneous Q4H   Continuous Infusions: . sodium chloride Stopped (09/20/17 0851)  . sodium chloride Stopped  (09/21/17 0848)     LOS: 5 days    Time spent: 25 minutes   Noralee StainJennifer Satina Jerrell, DO [facility] [email or web address] Password [email or web address] 09/21/2017, 8:59 AM

## 2017-09-22 DIAGNOSIS — E871 Hypo-osmolality and hyponatremia: Secondary | ICD-10-CM

## 2017-09-22 DIAGNOSIS — I10 Essential (primary) hypertension: Secondary | ICD-10-CM

## 2017-09-22 DIAGNOSIS — R945 Abnormal results of liver function studies: Secondary | ICD-10-CM

## 2017-09-22 DIAGNOSIS — D72829 Elevated white blood cell count, unspecified: Secondary | ICD-10-CM

## 2017-09-22 DIAGNOSIS — K858 Other acute pancreatitis without necrosis or infection: Secondary | ICD-10-CM

## 2017-09-22 DIAGNOSIS — K859 Acute pancreatitis without necrosis or infection, unspecified: Principal | ICD-10-CM

## 2017-09-22 DIAGNOSIS — R109 Unspecified abdominal pain: Secondary | ICD-10-CM

## 2017-09-22 LAB — CBC
HCT: 39.7 % (ref 39.0–52.0)
HEMOGLOBIN: 12.7 g/dL — AB (ref 13.0–17.0)
MCH: 28.5 pg (ref 26.0–34.0)
MCHC: 32 g/dL (ref 30.0–36.0)
MCV: 89.2 fL (ref 78.0–100.0)
Platelets: 313 10*3/uL (ref 150–400)
RBC: 4.45 MIL/uL (ref 4.22–5.81)
RDW: 15.7 % — ABNORMAL HIGH (ref 11.5–15.5)
WBC: 11.8 10*3/uL — AB (ref 4.0–10.5)

## 2017-09-22 LAB — BASIC METABOLIC PANEL
ANION GAP: 10 (ref 5–15)
BUN: 9 mg/dL (ref 6–20)
CALCIUM: 9.4 mg/dL (ref 8.9–10.3)
CO2: 29 mmol/L (ref 22–32)
Chloride: 100 mmol/L (ref 98–111)
Creatinine, Ser: 0.99 mg/dL (ref 0.61–1.24)
Glucose, Bld: 165 mg/dL — ABNORMAL HIGH (ref 70–99)
POTASSIUM: 3.4 mmol/L — AB (ref 3.5–5.1)
SODIUM: 139 mmol/L (ref 135–145)

## 2017-09-22 LAB — GLUCOSE, CAPILLARY
GLUCOSE-CAPILLARY: 149 mg/dL — AB (ref 70–99)
GLUCOSE-CAPILLARY: 178 mg/dL — AB (ref 70–99)
GLUCOSE-CAPILLARY: 187 mg/dL — AB (ref 70–99)
Glucose-Capillary: 224 mg/dL — ABNORMAL HIGH (ref 70–99)

## 2017-09-22 MED ORDER — ZOLPIDEM TARTRATE 5 MG PO TABS
5.0000 mg | ORAL_TABLET | Freq: Every evening | ORAL | Status: DC | PRN
  Administered 2017-09-22: 5 mg via ORAL
  Filled 2017-09-22: qty 1

## 2017-09-22 MED ORDER — POTASSIUM CHLORIDE CRYS ER 20 MEQ PO TBCR
40.0000 meq | EXTENDED_RELEASE_TABLET | Freq: Once | ORAL | Status: AC
  Administered 2017-09-22: 40 meq via ORAL
  Filled 2017-09-22: qty 2

## 2017-09-22 MED ORDER — BLOOD GLUCOSE METER KIT: PACK | 0 refills | Status: DC

## 2017-09-22 MED ORDER — METFORMIN HCL 500 MG PO TABS: 500.0000 mg | ORAL_TABLET | Freq: Two times a day (BID) | ORAL | 0 refills | Status: DC

## 2017-09-22 MED ORDER — ATORVASTATIN CALCIUM 40 MG PO TABS: 40.0000 mg | ORAL_TABLET | Freq: Every day | ORAL | 0 refills | Status: DC

## 2017-09-22 MED ORDER — TRAMADOL HCL 50 MG PO TABS: 50.0000 mg | ORAL_TABLET | Freq: Four times a day (QID) | ORAL | 0 refills | Status: DC | PRN

## 2017-09-22 NOTE — Progress Notes (Signed)
Patient and wife given prescription for glucose monitor.

## 2017-09-22 NOTE — Discharge Summary (Signed)
Physician Discharge Summary  Douglas [name]. UYQ:[id number] DOB: 05/03/1985 DOA: 09/15/2017  PCP: System, Pcp Not In  Admit date: 09/15/2017 Discharge date: 09/22/2017  Time spent: 45 minutes  Recommendations for Outpatient Follow-up:  Patient will be discharged to home.  Patient will need to follow up with primary care provider within one week of discharge.  Patient should continue medications as prescribed.  Patient should follow a carb modified/heart healthy diet.   Discharge Diagnoses:  Acute hypertriglyceridemia associated pancreatitis Fever Diabetes mellitus, type II, newly diagnosed Hypokalemia Morbid obesity Essential hypertension  Discharge Condition: Stable  Diet recommendation: Heart healthy/carb modified  Filed Weights   09/15/17 1944 09/16/17 1000  Weight: (!) 149.7 kg (330 lb) (!) 149.4 kg (329 lb 5.9 oz)    History of present illness:  On 09/16/2017 by Dr. Bryn Gulling Douglas [name]. is a 32 y.o. male with medical history significant for HTN, prediabetes who does not taking his medications and is being admitted for pancreatitis. He started having abdominal pain in the late morning of 7/31, burning epigastric discomfort radiating to his back. Associated nausea and vomiting. No fevers, chills, sick contacts, new medications, does not drink alcohol except occasionally. Does not smoke cigarettes.    Hospital Course:  Acute hypertriglyceridemia associated pancreatitis -She was noted to have triglycerides of over 5000 and has improved to 486 -Patient was treated with IV insulin which was discontinued -Continue Lipitor and fenofibrate -Patient was having uncontrolled pain and was placed on PCA pump, this does discontinued.  Patient states his pain is a 2 out of 10 today. -CT abdomen showed worsening pancreatitis, adynamic ileus.  Gastroenterology, Dr. Matthias Hughs was contacted on 09/18/2017 and recommended holding off on antibiotics as there was no sign  of necrosis or abscess on imaging -Patient was placed on clear liquid diet and advance to soft diet which he tolerated well  Fever -Blood cultures have been negative to date -Currently appears to be afebrile reports of cough or urinary symptoms  Diabetes mellitus, type II, newly diagnosed -Hemoglobin A1c 8.9 -Patient was initially placed on IV insulin, and transition to insulin sliding scale -Diabetes coordinator was consulted -Will discharge patient with metformin 500 mg twice daily -Discussed lifestyle modifications including diet and exercise with patient.  He will need close follow-up with PCP on discharge  Hypokalemia -Replaced, repeat BMP in 1 week  Morbid obesity -BMI 44.6 -Patient to discuss lifestyle modifications with PCP  Essential hypertension -continue HCTZ  Procedures: None  Consultations: Gastroenterology, Dr. Matthias Hughs via phone by Dr. Alvino Chapel  Discharge Exam: Vitals:   09/22/17 0428 09/22/17 0900  BP: 140/89   Pulse: 96   Resp: (!) 21   Temp: 99.4 F (37.4 C)   SpO2: 100% 95%   Patient feeling better today.  Feels pain is 2 out of 10.  Would like to eat solid food.  Denies current chest pain, shortness breath, abdominal pain, nausea or vomiting, diarrhea constipation, dizziness or headache.   General: Well developed, well nourished, NAD, appears stated age  HEENT: NCAT, mucous membranes moist.  Neck: Supple  Cardiovascular: S1 S2 auscultated, no rubs, murmurs or gallops. Regular rate and rhythm.  Respiratory: Clear to auscultation bilaterally with equal chest rise  Abdomen: Soft, obese, nontender, nondistended, + bowel sounds  Extremities: warm dry without cyanosis clubbing or edema  Neuro: AAOx3, nonfocal  Skin: Without rashes exudates or nodules  Psych: Normal affect and demeanor, pleasant   Discharge Instructions Discharge Instructions    Discharge instructions  Complete by:  As directed    Patient will be discharged to home.  Patient  will need to follow up with primary care provider within one week of discharge.  Patient should continue medications as prescribed.  Patient should follow a carb modified/heart healthy diet.     Allergies as of 09/22/2017   No Known Allergies     Medication List    STOP taking these medications   dicyclomine 20 MG tablet Commonly known as:  BENTYL   ondansetron 4 MG disintegrating tablet Commonly known as:  ZOFRAN ODT   rosuvastatin 20 MG tablet Commonly known as:  CRESTOR     TAKE these medications   atorvastatin 40 MG tablet Commonly known as:  LIPITOR Take 1 tablet (40 mg total) by mouth daily at 6 PM.   fenofibrate 160 MG tablet Take 160 mg by mouth daily.   hydrochlorothiazide 25 MG tablet Commonly known as:  HYDRODIURIL Take 25 mg by mouth daily.   ibuprofen 800 MG tablet Commonly known as:  ADVIL,MOTRIN Take 800 mg by mouth daily as needed for pain.   metFORMIN 500 MG tablet Commonly known as:  GLUCOPHAGE Take 1 tablet (500 mg total) by mouth 2 (two) times daily with a meal.      No Known Allergies Follow-up Information    [facility]. Call.   Why:  call the above number to make a hospital follow up appt and to obtain a pcp. Contact information: [address] [place] [id number] [phone]           The results of significant diagnostics from this hospitalization (including imaging, microbiology, ancillary and laboratory) are listed below for reference.    Significant Diagnostic Studies: Ct Abdomen W Contrast  Result Date: 09/18/2017 CLINICAL DATA:  Follow-up acute pancreatitis. Fever, abdominal pain, and leukocytosis. EXAM: CT ABDOMEN WITH CONTRAST TECHNIQUE: Multidetector CT imaging of the abdomen was performed using the standard protocol following bolus administration of intravenous contrast. CONTRAST:  100mL ISOVUE-300 IOPAMIDOL (ISOVUE-300) INJECTION 61% COMPARISON:  09/16/2017 FINDINGS: Lower  chest: Increased dependent atelectasis in both lung bases as well as tiny left pleural effusion. Hepatobiliary: No hepatic masses identified. Severe hepatic steatosis again seen. High attenuation in the gallbladder likely represents excreted contrast from recent CT. No evidence of cholecystitis or biliary ductal dilatation. Pancreas: Acute pancreatitis is again demonstrated, with increased peripancreatic inflammatory changes and small amount of retroperitoneal and intraperitoneal fluid extending inferiorly into the right lower quadrant. No evidence of pancreatic necrosis or neoplasm. No pseudocysts identified. No evidence of pancreatic mass or ductal dilatation. Spleen:  Within normal limits in size and appearance. Adrenals/Urinary Tract: No masses identified. No evidence of hydronephrosis. Stomach/Bowel: Moderately dilated small bowel and transverse colon, consistent with adynamic ileus. Wall thickening of duodenum shows no significant change and is likely secondary to acute pancreatitis. Vascular/Lymphatic: No pathologically enlarged lymph nodes identified. No abdominal aortic aneurysm. Other:  None. Musculoskeletal:  No suspicious bone lesions identified. IMPRESSION: Worsening acute pancreatitis, with increased peripancreatic inflammatory changes and small amount of free fluid. No evidence of pancreatic necrosis or pseudocysts. Worsening adynamic ileus. Increased bilateral lower lobe atelectasis and tiny left pleural effusion. Severe hepatic steatosis. Electronically Signed   By: Myles RosenthalJohn  Stahl M.D.   On: 09/18/2017 13:46   [place] Abdomen Complete  Result Date: 09/16/2017 CLINICAL DATA:  Initial evaluation for acute pancreatitis. EXAM: ABDOMEN ULTRASOUND COMPLETE COMPARISON:  Prior CT from earlier the same day. FINDINGS: Gallbladder: No stones or sludge  within the gallbladder lumen. Gallbladder wall measures at the upper limits of normal 3.6 mm. No free pericholecystic fluid. No sonographic Murphy sign elicited on  exam part Common bile duct: Diameter: 4.8 mm Liver: No focal lesion identified. Diffusely increased echogenicity, consistent with steatosis. Portal vein is patent on color Doppler imaging with normal direction of blood flow towards the liver. IVC: No abnormality visualized. Pancreas: Not well visualized due to overlying bowel gas. Spleen: Size and appearance within normal limits. Right Kidney: Length: 12.1 cm. Echogenicity within normal limits. No mass or hydronephrosis visualized. Left Kidney: Length: 12.0 cm. Echogenicity within normal limits. No mass or hydronephrosis visualized. Abdominal aorta: No aneurysm visualized. Other findings: None. IMPRESSION: 1. Hepatic steatosis. 2. No other acute intra-abdominal abnormality. No cholelithiasis or evidence for acute cholecystitis. No biliary dilatation. Electronically Signed   By: Rise Mu M.D.   On: 09/16/2017 04:38   Ct Abdomen Pelvis W Contrast  Result Date: 09/16/2017 CLINICAL DATA:  Abdominal pain, nausea, leukocytosis, elevated lipase, microhematuria EXAM: CT ABDOMEN AND PELVIS WITH CONTRAST TECHNIQUE: Multidetector CT imaging of the abdomen and pelvis was performed using the standard protocol following bolus administration of intravenous contrast. CONTRAST:  ISOVUE-300 IOPAMIDOL (ISOVUE-300) INJECTION 61% COMPARISON:  01/06/2017 FINDINGS: Lower chest: Lung bases are clear. Hepatobiliary: Hepatic steatosis with focal fatty sparing along the gallbladder fossa. Gallbladder is unremarkable. No intrahepatic or extrahepatic ductal dilatation. Pancreas: Peripancreatic fluid/inflammatory changes along the pancreatic head/uncinate process. No pancreatic necrosis or ductal dilatation. No drainable fluid collection/pseudocyst. Spleen: Within normal limits. Adrenals/Urinary Tract: Adrenal glands are within normal limits. Kidneys are within normal limits.  No hydronephrosis. Bladder is within normal limits. Stomach/Bowel: Stomach is within normal limits.  No evidence of bowel obstruction. Mild wall thickening with secondary inflammatory changes involving the duodenum. Appendix is not discretely visualized. Vascular/Lymphatic: No evidence of abdominal aortic aneurysm. No suspicious abdominopelvic lymphadenopathy. Reproductive: Prostate is unremarkable. Other: No abdominopelvic ascites. Musculoskeletal: Visualized osseous structures are within normal limits. IMPRESSION: Acute pancreatitis.  No drainable fluid collection/pseudocyst. Mild secondary inflammatory changes involving the duodenum. Hepatic steatosis with focal fatty sparing. Electronically Signed   By: Charline Bills M.D.   On: 09/16/2017 02:10    Microbiology: Recent Results (from the past 240 hour(s))  MRSA PCR Screening     Status: None   Collection Time: 09/16/17  9:43 AM  Result Value Ref Range Status   MRSA by PCR NEGATIVE NEGATIVE Final    Comment:        The GeneXpert MRSA Assay (FDA approved for NASAL specimens only), is one component of a comprehensive MRSA colonization surveillance program. It is not intended to diagnose MRSA infection nor to guide or monitor treatment for MRSA infections. Performed at [facility], [address], [place], [place] [address]   Culture, blood (routine x 2)     Status: None (Preliminary result)   Collection Time: 09/18/17  8:09 AM  Result Value Ref Range Status   Specimen Description   Final    BLOOD RIGHT HAND Performed at [facility], [address], [place], [place] [address]    Special Requests   Final    BOTTLES DRAWN AEROBIC ONLY Blood Culture adequate volume Performed at [facility], [address], [place], [place] [address]    Culture   Final    NO GROWTH 3 DAYS Performed at [facility], [address], [place], [place] [address]    Report Status PENDING  Incomplete  Culture,  blood (routine x 2)     Status: None (Preliminary result)   Collection Time:  09/18/17  8:09 AM  Result Value Ref Range Status   Specimen Description   Final    BLOOD LEFT ARM Performed at [facility], [address] [place], [place] [address]    Special Requests   Final    BOTTLES DRAWN AEROBIC ONLY Blood Culture adequate volume Performed at [facility], [address], [place], [place] [address]    Culture   Final    NO GROWTH 3 DAYS Performed at [facility], [address] [place], [place] [address]    Report Status PENDING  Incomplete     Labs: Basic Metabolic Panel: Recent Labs  Lab 09/18/17 0514 09/19/17 0342 09/20/17 0355 09/21/17 0329 09/22/17 0438  NA 129* 134* 135 138 139  K 4.1 2.8* 3.0* 3.3* 3.4*  CL 97* 99 101 101 100  CO2 24 27 26 28 29   GLUCOSE 169* 143* 146* 233* 165*  BUN 7 6 5* 5* 9  CREATININE 1.07 0.87 0.85 0.89 0.99  CALCIUM 7.7* 7.8* 8.1* 9.1 9.4  MG  --   --  2.3 2.3  --    Liver Function Tests: Recent Labs  Lab 09/15/17 2020 09/16/17 0544 09/18/17 1651  AST 82* 61* 31  ALT 72* 64* 26  ALKPHOS 128* 81 55  BILITOT 2.3* 1.7* 0.9  PROT 8.0 7.3 7.1  ALBUMIN 4.6 4.2 3.0*   Recent Labs  Lab 09/15/17 2020 09/18/17 1651  LIPASE 145* 62*   No results for input(s): AMMONIA in the last 168 hours. CBC: Recent Labs  Lab 09/18/17 0514 09/19/17 0342 09/20/17 0355 09/21/17 0329 09/22/17 0438  WBC 10.7* 8.8 9.6 11.6* 11.8*  HGB 14.7 12.3* 11.9* 12.2* 12.7*  HCT 42.5 37.6* 36.6* 38.2* 39.7  MCV 85.5 86.6 87.4 88.0 89.2  PLT 247 251 254 302 313   Cardiac Enzymes: No results for input(s): CKTOTAL, CKMB, CKMBINDEX, TROPONINI in the last 168 hours. BNP: BNP (last 3 results) No results for input(s): BNP in the last 8760 hours.  ProBNP (last 3 results) No results for input(s): PROBNP in the last 8760 hours.  CBG: Recent Labs  Lab 09/21/17 2030 09/22/17 0016 09/22/17 0424 09/22/17 0800 09/22/17 1156  GLUCAP 222* 178* 149* 187* 224*        Signed:  Fordyce Lepak  [facility] 09/22/2017, 12:59 PM

## 2017-09-22 NOTE — Progress Notes (Signed)
Inpatient Diabetes Program Recommendations  AACE/ADA: New Consensus Statement on Inpatient Glycemic Control (2015)  Target Ranges:  Prepandial:   less than 140 mg/dL      Peak postprandial:   less than 180 mg/dL (1-2 hours)      Critically ill patients:  140 - 180 mg/dL   Lab Results  Component Value Date   GLUCAP 224 (H) 09/22/2017   HGBA1C 8.9 (H) 09/16/2017    Pt to be discharged this afternoon. Met with pt and wife about going home on metformin 500 mg bid. Instructed to take with food. Discussed importance of weight loss and exercise to control blood sugars. Pt seems very motivated to make changes with portion control and eating lower CHO foods.   Instructed to follow-up with PCP for diabetes management. Pt to obtain glucose meter and check blood sugars at various times and take logbook to PCP for review.    Discussed with Dr. Ree Kida.  Thank you. Lorenda Peck, RD, LDN, CDE Inpatient Diabetes Coordinator [phone]

## 2017-09-22 NOTE — Discharge Instructions (Signed)
Acute Pancreatitis Acute pancreatitis is a condition in which the pancreas suddenly gets irritated and swollen (has inflammation). The pancreas is a large gland behind the stomach. It makes enzymes that help to digest food. The pancreas also makes hormones that help to control your blood sugar. Acute pancreatitis happens when the enzymes attack the pancreas and damage it. Most attacks last a couple of days and can cause serious problems. Follow these instructions at home: Eating and drinking  Follow instructions from your doctor about diet. You may need to: ? Avoid alcohol. ? Limit how much fat is in your diet.  Eat small meals often. Avoid eating big meals.  Drink enough fluid to keep your pee (urine) clear or pale yellow.  Do not drink alcohol if it caused your condition. General instructions  Take over-the-counter and prescription medicines only as told by your doctor.  Do not use any tobacco products. These include cigarettes, chewing tobacco, and e-cigarettes. If you need help quitting, ask your doctor.  Get plenty of rest.  If directed, check your blood sugar at home as told by your doctor.  Keep all follow-up visits as told by your doctor. This is important. Contact a doctor if:  You do not get better as quickly as expected.  You have new symptoms.  Your symptoms get worse.  You have lasting pain or weakness.  You continue to feel sick to your stomach (nauseous).  You get better and then you have another pain attack.  You have a fever. Get help right away if:  You cannot eat or keep fluids down.  Your pain becomes very bad.  Your skin or the white part of your eyes turns yellow (jaundice).  You throw up (vomit).  You feel dizzy or you pass out (faint).  Your blood sugar is high (over 300 mg/dL). This information is not intended to replace advice given to you by your health care provider. Make sure you discuss any questions you have with your health care  provider. Document Released: 07/22/2007 Document Revised: 07/11/2015 Document Reviewed: 11/06/2014 Elsevier Interactive Patient Education  2018 [employer]  Type 2 Diabetes Mellitus, Diagnosis, Adult Type 2 diabetes (type 2 diabetes mellitus) is a long-term (chronic) disease. It may be caused by one or both of these problems:  Your body does not make enough of a hormone called insulin.  Your body does not react in a normal way to insulin that it makes.  Insulin lets sugars (glucose) go into cells in the body. This gives you energy. If you have type 2 diabetes, sugars cannot get into cells. This causes high blood sugar (hyperglycemia). Your doctor will set treatment goals for you. Generally, you should have these blood sugar levels:  Before meals (preprandial): 80-130 mg/dL (4.0-9.84.4-7.2 mmol/L).  After meals (postprandial): below 180 mg/dL (10 mmol/L).  A1c (hemoglobin A1c) level: less than 7%.  Follow these instructions at home: Questions to Ask Your Doctor  You may want to ask these questions:  Do I need to meet with a diabetes educator?  Where can I find a support group for people with diabetes?  What equipment will I need to care for myself at home?  What diabetes medicines do I need? When should I take them?  How often do I need to check my blood sugar?  What number can I call if I have questions?  When is my next doctor's visit?  General instructions  Take over-the-counter and prescription medicines only as told by your doctor.  Keep all follow-up visits as told by your doctor. This is important. Contact a doctor if:  Your blood sugar is at or above 240 mg/dL (16.1 mmol/L) for 2 days in a row.  You have been sick or have had a fever for 2 days or more and you are not getting better.  You have any of these problems for more than 6 hours: ? You cannot eat or drink. ? You feel sick to your stomach (nauseous). ? You throw up (vomit). ? You have watery poop  (diarrhea). Get help right away if:  Your blood sugar is lower than 54 mg/dL (3 mmol/L).  You get confused.  You have trouble: ? Thinking clearly. ? Breathing.  You have moderate or large ketone levels in your pee (urine). This information is not intended to replace advice given to you by your health care provider. Make sure you discuss any questions you have with your health care provider. Document Released: 11/12/2007 Document Revised: 07/11/2015 Document Reviewed: 03/08/2015 Elsevier Interactive Patient Education  [employer].

## 2017-09-22 NOTE — Progress Notes (Signed)
Provided patient discharge paperwork, reviewed medications and when to take them. Advised prescription sent to [facility] on [address]

## 2017-09-22 NOTE — Progress Notes (Signed)
Wife called stating that patient's pain is not being relieved with Tramadol 50 mg Rx that was prescribed. Per Charlynne Panderegina Baldwin RN, Advised pt that Dr. Catha GosselinMikhail is gone for the day and there is no way to get a prescription for a narcotic at this time. Advised patient to all back in the morning to speak with doctor about her concerns.

## 2017-09-23 LAB — CULTURE, BLOOD (ROUTINE X 2)
Culture: NO GROWTH
Culture: NO GROWTH
SPECIAL REQUESTS: ADEQUATE
Special Requests: ADEQUATE

## 2017-10-08 ENCOUNTER — Encounter: Payer: Self-pay | Admitting: Gastroenterology

## 2017-11-25 ENCOUNTER — Encounter: Payer: Self-pay | Admitting: Gastroenterology

## 2017-11-25 ENCOUNTER — Ambulatory Visit: Payer: 59 | Admitting: Gastroenterology

## 2017-11-25 VITALS — BP 130/70 | HR 72 | Ht 72.0 in | Wt 322.0 lb

## 2017-11-25 DIAGNOSIS — K8689 Other specified diseases of pancreas: Secondary | ICD-10-CM | POA: Diagnosis not present

## 2017-11-25 DIAGNOSIS — K625 Hemorrhage of anus and rectum: Secondary | ICD-10-CM

## 2017-11-25 DIAGNOSIS — E781 Pure hyperglyceridemia: Secondary | ICD-10-CM

## 2017-11-25 DIAGNOSIS — Z8719 Personal history of other diseases of the digestive system: Secondary | ICD-10-CM

## 2017-11-25 NOTE — Patient Instructions (Addendum)
You have been scheduled for a CT scan of the abdomen and pelvis at [facility] ([address]this is in the same building as Press [facility]).   You are scheduled on 12/10/2017   At 8:30am . You should arrive 15 minutes prior to your appointment time for registration. Please follow the written instructions below on the day of your exam:  WARNING: IF YOU ARE ALLERGIC TO IODINE/X-RAY DYE, PLEASE NOTIFY RADIOLOGY IMMEDIATELY AT [phone]! YOU WILL BE GIVEN A 13 HOUR PREMEDICATION PREP.  1) Do not eat or drink anything after  (4 hours prior to your test) 2) You have been given 2 bottles of oral contrast to drink. The solution may taste better if refrigerated, but do NOT add ice or any other liquid to this solution. Shake well before drinking.    Drink 1 bottle of contrast @ 6:30am (2 hours prior to your exam)  Drink 1 bottle of contrast @  7:30am (1 hour prior to your exam)  You may take any medications as prescribed with a small amount of water, if necessary. If you take any of the following medications: METFORMIN, GLUCOPHAGE, GLUCOVANCE, AVANDAMET, RIOMET, FORTAMET, [place] MET, JANUMET, GLUMETZA or METAGLIP, you MAY be asked to HOLD this medication 48 hours AFTER the exam.  The purpose of you drinking the oral contrast is to aid in the visualization of your intestinal tract. The contrast solution may cause some diarrhea. Depending on your individual set of symptoms, you may also receive an intravenous injection of x-ray contrast/dye. Plan on being at [facility] for 30 minutes or longer, depending on the type of exam you are having performed.  This test typically takes 30-45 minutes to complete.    If you have any questions regarding your exam or if you need to reschedule, you may call the CT department at [phone] between the hours of 8:00 am and 5:00 pm, Monday-Friday.  Your provider has requested that you go to the basement level for lab work before leaving  today. Press "B" on the elevator. The lab is located at the first door on the left as you exit the elevator.  Please take Fibercon 1-2 times daily  We have scheduled you for a follow up appointment with Dr Rush Landmark on 01/06/18 at 2pm  ________________________________________________________________________

## 2017-11-25 NOTE — Progress Notes (Signed)
GASTROENTEROLOGY OUTPATIENT CLINIC VISIT   Primary Care Provider Tomasa Hose, NP [place] [id number] [phone]  Referring Provider No referring provider defined for this encounter.   Patient Profile: Douglas [name]. is a 32 y.o. male with a pmh significant for Hypertriglyceridemia Induced Pancreatitis, HLD, HTN, DM.  The patient presents to the [facility] for an evaluation and management of problem(s) noted below:  Problem List 1. History of pancreatitis   2. Hypertriglyceridemia   3. Peripancreatic fluid collection   4. Rectal bleeding     History of Present Illness: This is the patient's first visit to the outpatient GI clinic at [facility].  He was recently hospitalized in August 2019 with acute pancreatitis that was felt to be hypertriglyceridemia induced pancreatitis.  During that hospital stay he was not evaluated by the GI service but rather by the medical service.  He underwent cross-sectional imaging On 2 occasions while he was in the hospital with findings of peripancreatic fluid/inflammatory changes of the head and uncinate region and no pancreatic ductal dilation or hepatobiliary dilation repeat contrasted CT imaging was performed because the patient had fevers and abdominal pain with rising leukocytosis which once again showed increasing peripancreatic inflammatory changes and retroperitoneal/intraperitoneal fluid extending to the right lower quadrant but no evidence of biliary ductal dilation.eventually the patient was able to be discharged without needing nasoenteric feeding.  It took him the next few weeks to finally feel better and he ended up following up with his primary care provider in the interim.  By the end of August he was feeling better and had been able to have back to work.  Patient has had weight regain over the course the last few weeks.  He no longer has any significant abdominal pain.  He does describe  at times having some mild early satiety.  Denies any nausea or vomiting.  He reports having had some minor stool on the toilet paper when he has been having bowel movements in the course the last 3 days.  He notes in the past when he has been off of his metformin for a period in time and then reinitiates that he has GI upset but after a few days this improves.  He had not been taking his metformin for the last few days.  In 2015 he reports having undergone a flexible sigmoidoscopy at the [facility] in [place] and was told that it was negative but we do not have access to those records and they are unable to be pulled up in care everywhere.  The patient states that he has solid stools.  He does not use any fiber supplements or laxatives.  He has taken nonsteroidals but on infrequent basis.  He has never had an upper endoscopy or a full colonoscopy.  GI Review of Systems Positive as above Negative for dysphasia, odynophagia, jaundice, bloating, diarrhea, melena, fecal urgency, rectal tenesmus  Review of Systems General: Denies fevers/chills/weight loss/night sweats HEENT: Denies oral lesions/headaches/visual changes/sore throat Cardiovascular: Denies chest pain/palpitations Pulmonary: Denies shortness of breath/cough Gastroenterological: See HPI Genitourinary: Denies darkened urine or hematuria Hematological: Denies easy bruising/bleeding Endocrine: Denies temperature intolerance Dermatological: Denies skin changes Psychological: Mood is stable Allergy & Immunology: Denies severe allergic reactions Musculoskeletal: Denies new arthralgias   Medications Current Outpatient Medications  Medication Sig Dispense Refill  . atorvastatin (LIPITOR) 40 MG tablet Take 1 tablet (40 mg total) by mouth daily at 6 PM. 30 tablet 0  . blood  glucose meter kit and supplies Dispense based on patient and insurance preference. Use up to four times daily as directed. (FOR ICD-10 E10.9, E11.9). 1 each  0  . fenofibrate 160 MG tablet Take 160 mg by mouth daily.    . hydrochlorothiazide (HYDRODIURIL) 25 MG tablet Take 25 mg by mouth daily.    Marland Kitchen ibuprofen (ADVIL,MOTRIN) 800 MG tablet Take 800 mg by mouth daily as needed for pain.    . metFORMIN (GLUCOPHAGE) 500 MG tablet Take 2 tablets in the AM and 1 tablet in the PM    . metFORMIN (GLUCOPHAGE) 500 MG tablet Take 1 tablet (500 mg total) by mouth 2 (two) times daily with a meal. 60 tablet 0   No current facility-administered medications for this visit.     Allergies No Known Allergies  Histories Past Medical History:  Diagnosis Date  . Diabetes ([facility])   . Hypercholesteremia   . Hypertension   . Pancreatitis    Past Surgical History:  Procedure Laterality Date  . TONSILLECTOMY    . WISDOM TOOTH EXTRACTION     Social History   Socioeconomic History  . Marital status: Married    Spouse name: Not on file  . Number of children: 5  . Years of education: Not on file  . Highest education level: Not on file  Occupational History  . Occupation: Physicist, medical Needs  . Financial resource strain: Not on file  . Food insecurity:    Worry: Not on file    Inability: Not on file  . Transportation needs:    Medical: Not on file    Non-medical: Not on file  Tobacco Use  . Smoking status: Never Smoker  . Smokeless tobacco: Never Used  Substance and Sexual Activity  . Alcohol use: No    Frequency: Never  . Drug use: No  . Sexual activity: Not on file  Lifestyle  . Physical activity:    Days per week: Not on file    Minutes per session: Not on file  . Stress: Not on file  Relationships  . Social connections:    Talks on phone: Not on file    Gets together: Not on file    Attends religious service: Not on file    Active member of club or organization: Not on file    Attends meetings of clubs or organizations: Not on file    Relationship status: Not on file  . Intimate partner violence:    Fear of current or ex  partner: Not on file    Emotionally abused: Not on file    Physically abused: Not on file    Forced sexual activity: Not on file  Other Topics Concern  . Not on file  Social History Narrative  . Not on file   Family History  Problem Relation Age of Onset  . Hypercholesterolemia Mother   . Hypertension Mother   . Diabetes Father   . Colon cancer Neg Hx   . Esophageal cancer Neg Hx   . Inflammatory bowel disease Neg Hx   . Liver disease Neg Hx   . Pancreatic cancer Neg Hx   . Rectal cancer Neg Hx   . Stomach cancer Neg Hx    I have reviewed his medical, social, and family history in detail and updated the electronic medical record as necessary.    PHYSICAL EXAMINATION  BP 130/70   Pulse 72   Ht 6' (1.829 m)   Wt (!) 322 lb (  146.1 kg)   BMI 43.67 kg/m   Wt Readings from Last 3 Encounters:  11/25/17 (!) 322 lb (146.1 kg)  09/16/17 (!) 329 lb 5.9 oz (149.4 kg)  01/05/17 (!) 326 lb (147.9 kg)   GEN: NAD, appears stated age, doesn't appear chronically ill PSYCH: Cooperative, without pressured speech EYE: Conjunctivae pink, sclerae anicteric ENT: MMM, without oral ulcers, no erythema or exudates noted NECK: Supple, without thyromegaly CV: RR without R/Gs  RESP: CTAB posteriorly, without wheezing GI: NABS, soft, NT/ND, without rebound or guarding, no HSM appreciated GU: DRE shows MSK/EXT: _ edema, no palmar erythema, no Dupuytren's contractures SKIN: No jaundice, no spider angiomata, no concerning rashes NEURO:  Alert & Oriented x 3, no focal deficits, no evidence of asterixis   REVIEW OF DATA  I reviewed the following data at the time of this encounter:  GI Procedures and Studies  No relevant studies  Laboratory Studies  Reviewed in epic  09/17/2017 16:46 09/18/2017 05:14 09/18/2017 16:51 09/19/2017 03:42 09/19/2017 16:41 09/20/2017 03:55 09/20/2017 16:04 09/21/2017 03:29  Triglycerides 2,391 (H) 1,527 (H) 1,476 (H) 1,031 (H) 871 (H) 685 (H) 584 (H) 486 (H)    01/05/2017 23:38  09/15/2017 20:20 09/16/2017 05:44 09/18/2017 16:51  AST 61 (H) 82 (H) 61 (H) 31  ALT 67 (H) 72 (H) 64 (H) 26  Alkaline Phosphatase 98 128 (H) 81 55  Total Bilirubin 0.8 2.3 (H) 1.7 (H) 0.9    Imaging Studies  September 18, 2017 CT abdomen pelvis with contrast IMPRESSION:  Worsening acute pancreatitis, with increased peripancreatic inflammatory changes and small amount of free fluid. No evidence of pancreatic necrosis or pseudocysts. Worsening adynamic ileus. Increased bilateral lower lobe atelectasis and tiny left pleural effusion. Severe hepatic steatosis.  September 16, 2017 CT abdomen pelvis with contrast IMPRESSION:  Acute pancreatitis. No drainable fluid collection/pseudocyst. Mild secondary inflammatory changes involving the duodenum. Hepatic steatosis with focal fatty sparing.  September 16, 2017 abdominal ultrasound IMPRESSION:  1. Hepatic steatosis.  2. No other acute intra-abdominal abnormality. No cholelithiasis or evidence for acute cholecystitis. No biliary dilatation.   ASSESSMENT  Mr. Warren is a 32 y.o. male with a pmh significant for Hypertriglyceridemia Induced Pancreatitis, HLD, HTN, DM.  The patient is seen today for evaluation and management of:  1. History of pancreatitis   2. Hypertriglyceridemia   3. Peripancreatic fluid collection   4. Rectal bleeding    This is a patient who presents for follow-up in the setting of a presentation of acute hypertriglyceridemia induced pancreatitis.  He did have significant development of peripancreatic fluid collections and inflammation in the setting of his recent pancreatitis.  He otherwise has done extremely well and is now on therapy for his hypertriglyceridemia.  He is planning to follow-up with his primary care physician to see how his triglycerides are moving forward with possible further treatments as necessary.  At this point in time there is very minimal symptoms that the patient is having with regards to the potential for  complications from pancreatitis.  He describes a bit of early satiety hospital stay thus I am not completely convinced that this is a result of an enlarging fluid collection.  With that being said, he is at risk for the development of this and we will plan to seed with imaging to further evaluate and ensure that he has an improving fluid collection or no further fluid.  The patient also describes recent episode of rectal bleeding which she is attributing to his use of.  He describes  a prior history of flexible sigmoidoscopy with a negative work-up for etiology of rectal bleeding.  We will plan to obtain these records monitor his symptoms over the course the next few days as he becomes accustomed to his metformin once again.  Continues to have issues we will plan for possible colonoscopic evaluation.  We did discuss the need for initiating fiber supplementation as well as a consideration of laxative use.  We will begin a serological work-up to ensure that there are no other etiologies for his underlying pancreatitis as he is very young.  Further recommendations will be made after this initial work-up.  All patient questions were answered, to the best of my ability, and the patient agrees to the aforementioned plan of action with follow-up as indicated.   PLAN  1. History of pancreatitis - CBC; Future - Basic metabolic panel; Future - Lipase; Future - Hepatic function panel; Future - Triglycerides; Future - IgG 4; Future - Calcium, ionized; Future  2. Hypertriglyceridemia - Triglycerides; Future - Continue Fenofibrate and based on labs will need to be considered for Statin  3. Peripancreatic fluid collection - CT Abdomen Pelvis W Contrast; Future  4. Rectal bleeding - Begin fiber supplementation - Consider use of laxatives - Based on overall clinical trajectory will consider the role of endoscopic evaluation - TSH; Future    Orders Placed This Encounter  Procedures  . CT Abdomen Pelvis W  Contrast  . CBC  . Basic metabolic panel  . Lipase  . Hepatic function panel  . TSH  . Triglycerides  . IgG 4  . Calcium, ionized    New Prescriptions   No medications on file   Modified Medications   No medications on file    Planned Follow Up: No follow-ups on file.   Justice Britain, MD [facility] Office # [phone]

## 2017-11-28 ENCOUNTER — Encounter: Payer: Self-pay | Admitting: Gastroenterology

## 2017-11-28 DIAGNOSIS — K625 Hemorrhage of anus and rectum: Secondary | ICD-10-CM | POA: Insufficient documentation

## 2017-11-28 DIAGNOSIS — E781 Pure hyperglyceridemia: Secondary | ICD-10-CM | POA: Insufficient documentation

## 2017-11-28 DIAGNOSIS — Z8719 Personal history of other diseases of the digestive system: Secondary | ICD-10-CM | POA: Insufficient documentation

## 2017-11-28 DIAGNOSIS — K8689 Other specified diseases of pancreas: Secondary | ICD-10-CM | POA: Insufficient documentation

## 2017-12-01 ENCOUNTER — Encounter: Payer: Self-pay | Admitting: Gastroenterology

## 2017-12-01 NOTE — Progress Notes (Signed)
Review of records  May 18, 2013 flexible sigmoidoscopy for indication of hematochezia Perianal and digital rectal exams were normal. Examined colon appeared normal. No hemorrhoids were seen. The impression suggested a normal colon wall no hemorrhoids were seen is possible they were not engorged with blood and thus not clearly visible.  History of blood on the toilet tissues most consistent with an anal source hemorrhoids or an anal fissure.  Plan was for return to outpatient clinic if necessary.  It suggested a follow-up colonoscopy for screening at 32 years of age unless something were to change. We will have these records scanned into the chart.  Corliss Parish, MD [facility] Office # [phone]

## 2017-12-10 ENCOUNTER — Inpatient Hospital Stay: Admission: RE | Admit: 2017-12-10 | Payer: 59 | Source: Ambulatory Visit

## 2017-12-24 ENCOUNTER — Other Ambulatory Visit: Payer: 59

## 2017-12-30 ENCOUNTER — Inpatient Hospital Stay: Admission: RE | Admit: 2017-12-30 | Payer: 59 | Source: Ambulatory Visit

## 2018-01-06 ENCOUNTER — Ambulatory Visit: Payer: 59 | Admitting: Gastroenterology

## 2018-01-20 ENCOUNTER — Inpatient Hospital Stay: Admission: RE | Admit: 2018-01-20 | Payer: 59 | Source: Ambulatory Visit

## 2019-02-06 ENCOUNTER — Other Ambulatory Visit: Payer: Self-pay

## 2019-02-06 ENCOUNTER — Emergency Department (HOSPITAL_COMMUNITY): Payer: 59

## 2019-02-06 ENCOUNTER — Encounter (HOSPITAL_COMMUNITY): Payer: Self-pay

## 2019-02-06 DIAGNOSIS — Y92009 Unspecified place in unspecified non-institutional (private) residence as the place of occurrence of the external cause: Secondary | ICD-10-CM | POA: Insufficient documentation

## 2019-02-06 DIAGNOSIS — Y999 Unspecified external cause status: Secondary | ICD-10-CM | POA: Insufficient documentation

## 2019-02-06 DIAGNOSIS — Z7984 Long term (current) use of oral hypoglycemic drugs: Secondary | ICD-10-CM | POA: Diagnosis not present

## 2019-02-06 DIAGNOSIS — Y939 Activity, unspecified: Secondary | ICD-10-CM | POA: Insufficient documentation

## 2019-02-06 DIAGNOSIS — E119 Type 2 diabetes mellitus without complications: Secondary | ICD-10-CM | POA: Diagnosis not present

## 2019-02-06 DIAGNOSIS — I10 Essential (primary) hypertension: Secondary | ICD-10-CM | POA: Diagnosis not present

## 2019-02-06 DIAGNOSIS — W2203XA Walked into furniture, initial encounter: Secondary | ICD-10-CM | POA: Diagnosis not present

## 2019-02-06 DIAGNOSIS — Z79899 Other long term (current) drug therapy: Secondary | ICD-10-CM | POA: Diagnosis not present

## 2019-02-06 DIAGNOSIS — S90121A Contusion of right lesser toe(s) without damage to nail, initial encounter: Secondary | ICD-10-CM | POA: Diagnosis not present

## 2019-02-06 DIAGNOSIS — S99921A Unspecified injury of right foot, initial encounter: Secondary | ICD-10-CM | POA: Diagnosis present

## 2019-02-06 NOTE — ED Triage Notes (Signed)
Pt hit his right pinky toe on furniture today and it's swollen

## 2019-02-07 ENCOUNTER — Emergency Department (HOSPITAL_COMMUNITY)
Admission: EM | Admit: 2019-02-07 | Discharge: 2019-02-07 | Disposition: A | Discharge status: Home / Self Care | Payer: 59 | Attending: Emergency Medicine | Admitting: Emergency Medicine

## 2019-02-07 DIAGNOSIS — S90229A Contusion of unspecified lesser toe(s) with damage to nail, initial encounter: Secondary | ICD-10-CM

## 2019-02-07 DIAGNOSIS — S99921A Unspecified injury of right foot, initial encounter: Secondary | ICD-10-CM

## 2019-02-07 NOTE — ED Provider Notes (Signed)
[facility] DEPT Provider Note   CSN: [id number] Arrival date & time: 02/06/19  2145     History Chief Complaint  Patient presents with  . Toe Injury    Douglas [name]. is a 33 y.o. male.  The history is provided by the patient and medical records.    33 y.o. M with hx of DM, HLP, HTN, presenting to the ED for right 5th toe injury.  Patient reports he was rushing to get out of the house and hit his toe against a piece of furniture outside the door.  States he has some discoloration of the distal 5th toe near the nail.  States he is diabetic so was concerned.  Denies numbness/weakness.  No open wound or laceration.  Past Medical History:  Diagnosis Date  . Diabetes ([facility])   . Hypercholesteremia   . Hypertension   . Pancreatitis     Patient Active Problem List   Diagnosis Date Noted  . History of pancreatitis 11/28/2017  . Hypertriglyceridemia 11/28/2017  . Peripancreatic fluid collection 11/28/2017  . Rectal bleeding 11/28/2017  . Acute pancreatitis 09/16/2017  . Abdominal pain 09/16/2017  . Leukocytosis 09/16/2017  . Hyponatremia 09/16/2017  . Abnormal LFTs 09/16/2017  . Obesity, Class III, BMI 40-49.9 (morbid obesity) ([facility]) 09/16/2017  . Hypertension 09/16/2017  . Pancreatitis 09/16/2017  . Acute pancreatitis without infection or necrosis 09/16/2017    Past Surgical History:  Procedure Laterality Date  . TONSILLECTOMY    . WISDOM TOOTH EXTRACTION         Family History  Problem Relation Age of Onset  . Hypercholesterolemia Mother   . Hypertension Mother   . Diabetes Father   . Colon cancer Neg Hx   . Esophageal cancer Neg Hx   . Inflammatory bowel disease Neg Hx   . Liver disease Neg Hx   . Pancreatic cancer Neg Hx   . Rectal cancer Neg Hx   . Stomach cancer Neg Hx     Social History   Tobacco Use  . Smoking status: Never Smoker  . Smokeless tobacco: Never Used  Substance Use Topics  . Alcohol use: No  .  Drug use: No    Home Medications Prior to Admission medications   Medication Sig Start Date End Date Taking? Authorizing Provider  atorvastatin (LIPITOR) 40 MG tablet Take 1 tablet (40 mg total) by mouth daily at 6 PM. 09/22/17   Cristal Ford, DO  blood glucose meter kit and supplies Dispense based on patient and insurance preference. Use up to four times daily as directed. (FOR ICD-10 E10.9, E11.9). 09/22/17   Mikhail, Velta Addison, DO  fenofibrate 160 MG tablet Take 160 mg by mouth daily. 10/30/16   [provider]  hydrochlorothiazide (HYDRODIURIL) 25 MG tablet Take 25 mg by mouth daily. 10/29/16   [provider]  ibuprofen (ADVIL,MOTRIN) 800 MG tablet Take 800 mg by mouth daily as needed for pain. 09/24/16   [provider]  metFORMIN (GLUCOPHAGE) 500 MG tablet Take 1 tablet (500 mg total) by mouth 2 (two) times daily with a meal. 09/22/17 10/22/17  Mikhail, Velta Addison, DO  metFORMIN (GLUCOPHAGE) 500 MG tablet Take 2 tablets in the AM and 1 tablet in the PM    [provider]    Allergies    Patient has no known allergies.  Review of Systems   Review of Systems  Musculoskeletal: Positive for arthralgias.  All other systems reviewed and are negative.   Physical Exam Updated  Vital Signs BP (!) 152/104 (BP Location: Left Arm)   Pulse 93   Temp 99.3 F (37.4 C) (Oral)   Resp 16   Ht 6' (1.829 m)   Wt (!) 142.2 kg   SpO2 96%   BMI 42.50 kg/m   Physical Exam Vitals and nursing note reviewed.  Constitutional:      General: He is not in acute distress.    Appearance: He is well-developed.  HENT:     Head: Normocephalic and atraumatic.  Eyes:     Conjunctiva/sclera: Conjunctivae normal.     Pupils: Pupils are equal, round, and reactive to light.  Cardiovascular:     Rate and Rhythm: Normal rate and regular rhythm.     Heart sounds: Normal heart sounds.  Pulmonary:     Effort: Pulmonary effort is normal. No respiratory distress.     Breath sounds:  Normal breath sounds. No rhonchi.  Abdominal:     General: Bowel sounds are normal.     Palpations: Abdomen is soft.     Tenderness: There is no guarding.     Hernia: No hernia is present.  Musculoskeletal:        General: Normal range of motion.     Cervical back: Normal range of motion.     Comments: Right 5th toe with bruising and discoloration at the level of the nailbed consistent with subungual hematoma as well as bruising at distal tip; no open wound, laceration, or bleeding; able to wiggle toes normally, normal DP pulse, distal sensation, and cap refill  Skin:    General: Skin is warm and dry.  Neurological:     Mental Status: He is alert and oriented to person, place, and time.     ED Results / Procedures / Treatments   Labs (all labs ordered are listed, but only abnormal results are displayed) Labs Reviewed - No data to display  EKG None  Radiology DG Foot Complete Right  Result Date: 02/06/2019 CLINICAL DATA:  Fifth toe injury EXAM: RIGHT FOOT COMPLETE - 3+ VIEW COMPARISON:  None. FINDINGS: There is no evidence of fracture or dislocation. There is no evidence of arthropathy or other focal bone abnormality. Mild soft tissue swelling seen around the lateral aspect of the foot. IMPRESSION: No acute osseous abnormality. Electronically Signed   By: Prudencio Pair M.D.   On: 02/06/2019 23:30    Procedures Procedures (including critical care time)  Medications Ordered in ED Medications - No data to display  ED Course  I have reviewed the triage vital signs and the nursing notes.  Pertinent labs & imaging results that were available during my care of the patient were reviewed by me and considered in my medical decision making (see chart for details).    MDM Rules/Calculators/A&P  33 year old male here with right fifth toe injury after hitting it on a piece of furniture while rushing to get out the door of his house.  Does have some bruising along area of the nailbed  consistent with subungual hematoma and mildly along distal toe.  No gross deformity.  Foot is neurovascularly intact.  Normal distal sensation and perfusion.  X-rays negative.  Discussed symptomatic management with Tylenol/Motrin.  Bruising should dissipate over the next days to weeks.  He can follow-up with PCP.  Return here for any new or acute changes.  Final Clinical Impression(s) / ED Diagnoses Final diagnoses:  Injury of toe on right foot, initial encounter  Subungual hematoma of lesser toe    Rx /  DC Orders ED Discharge Orders    None       Larene Pickett, PA-C 02/07/19 0254    Ward, Delice Bison, DO 02/07/19 [phone]

## 2019-02-07 NOTE — Discharge Instructions (Signed)
Tylenol or motrin for pain.  Bruising should self resolve over the next few days to weeks. Follow-up with your primary care doctor. Return here for any new/acute changes.

## 2019-07-18 IMAGING — CT CT ABDOMEN W/ CM
2 of 4 series · 16 of 46 positions shown, 18 images · IV contrast (APPLIED)
Comparison: 09/16/2017

CLINICAL DATA: Follow-up acute pancreatitis. Fever, abdominal pain,
and leukocytosis.

EXAM:
CT ABDOMEN WITH CONTRAST
TECHNIQUE: Multidetector CT imaging of the abdomen was performed using the
standard protocol following bolus administration of intravenous
contrast.
CONTRAST:  100mL B5ODDJ-Y44 IOPAMIDOL (B5ODDJ-Y44) INJECTION 61%

[Series 2: axial st · axial · 0.88mm/px · z∈[+1248,+1578]mm · 13 of 76 slices shown, 15 images]
[im 5/76  soft-tissue]
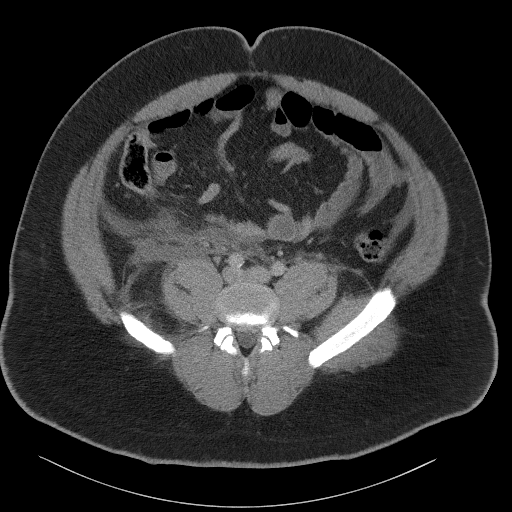
[im 5/76  bone]
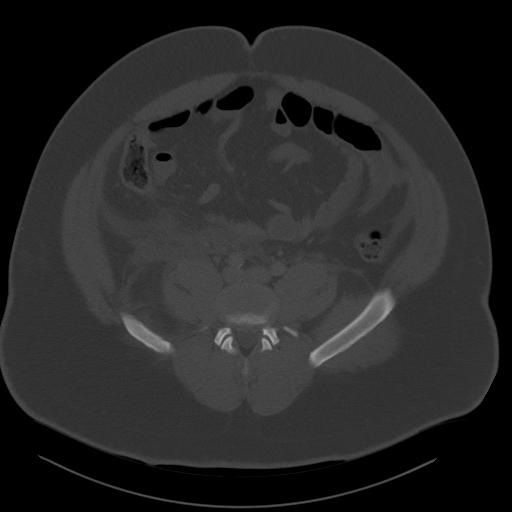
[im 10/76  soft-tissue]
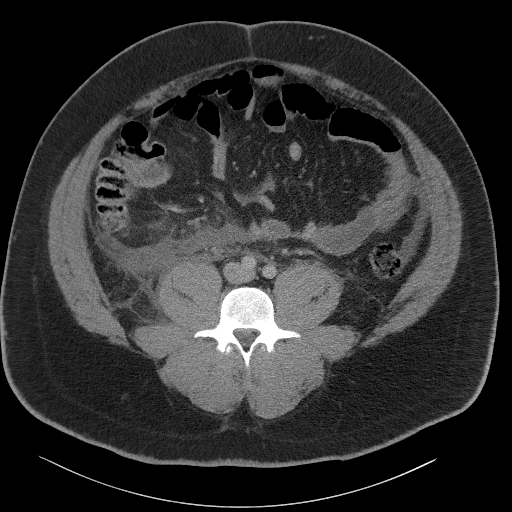
[im 15/76  soft-tissue]
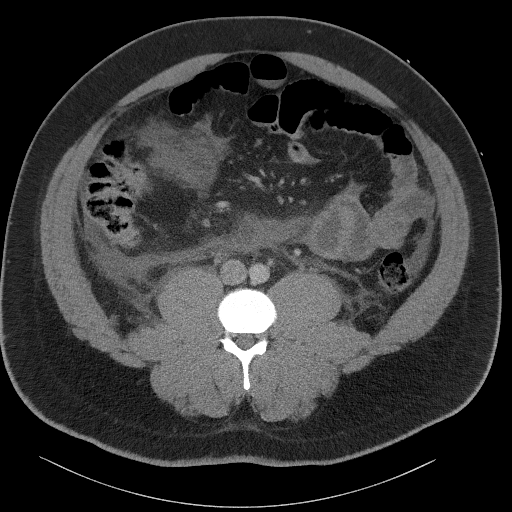
[im 24/76  soft-tissue]
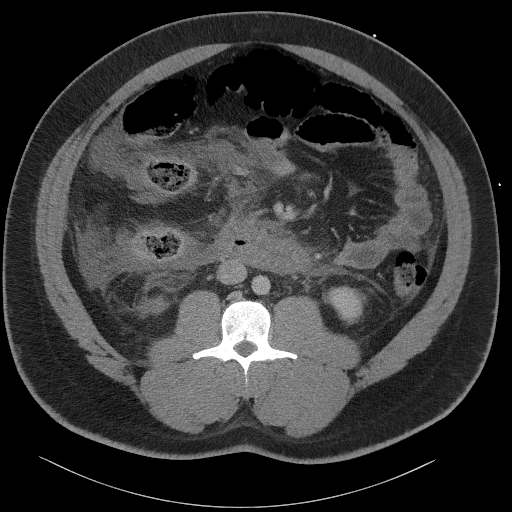
[im 29/76  soft-tissue]
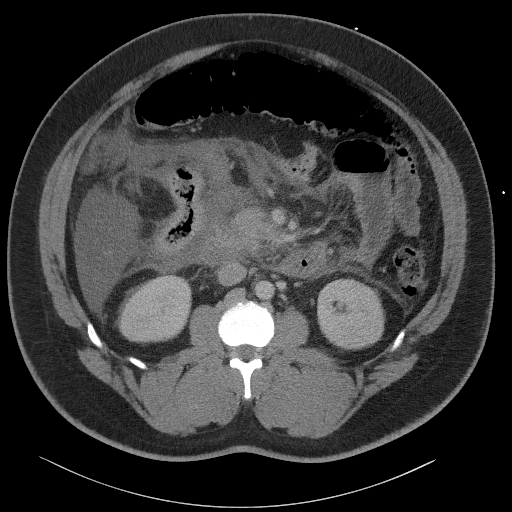
[im 33/76  soft-tissue]
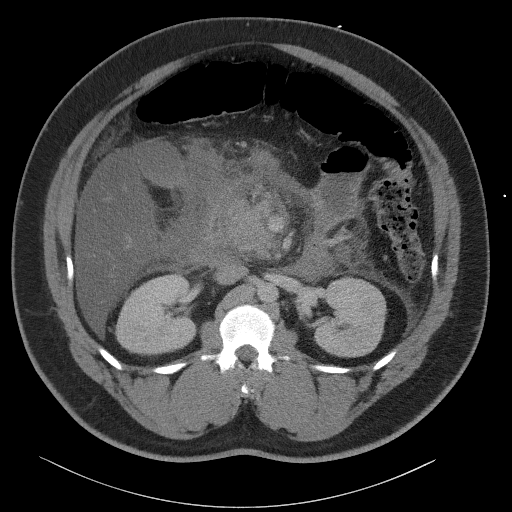
[im 38/76  soft-tissue]
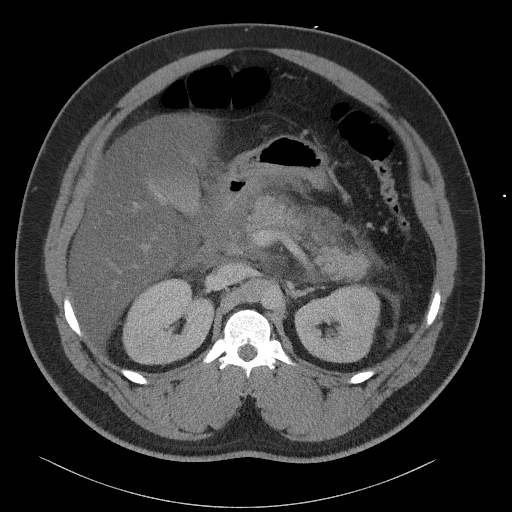
[im 43/76  soft-tissue]
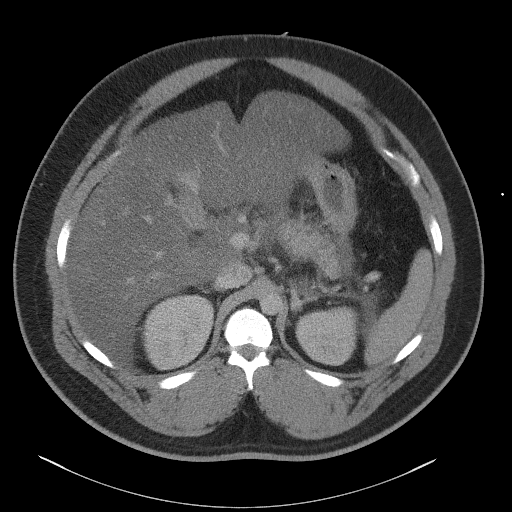
[im 47/76  soft-tissue]
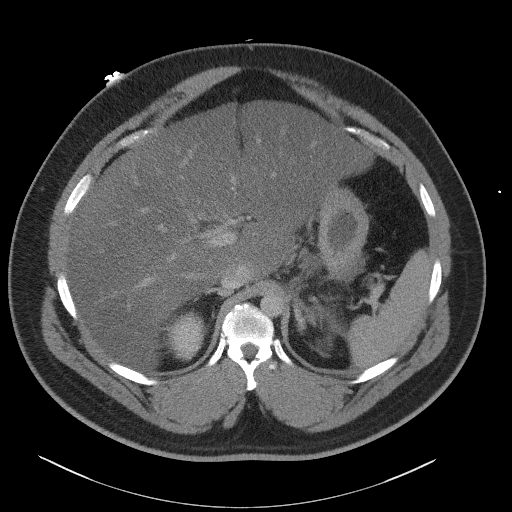
[im 47/76  bone]
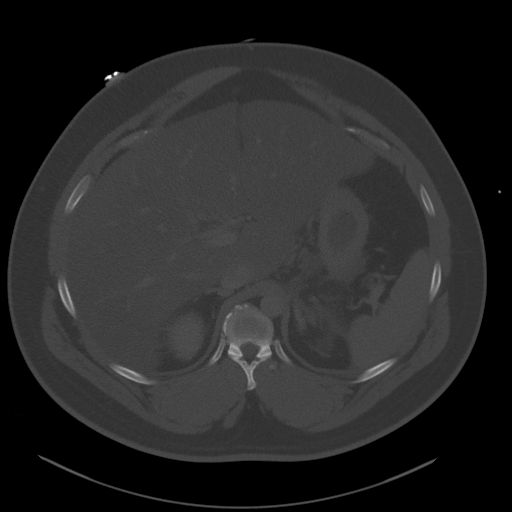
[im 52/76  soft-tissue]
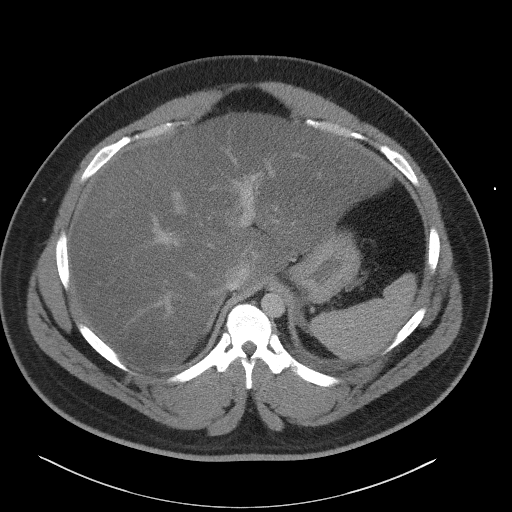
[im 61/76  soft-tissue]
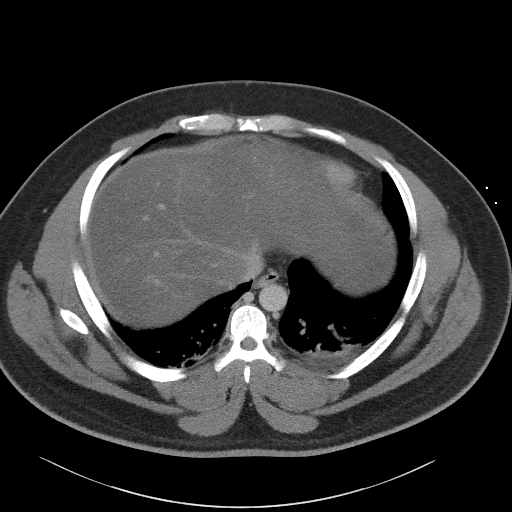
[im 66/76  soft-tissue]
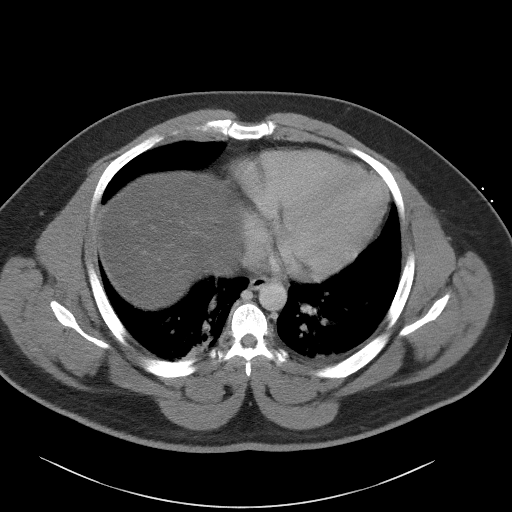
[im 71/76  soft-tissue]
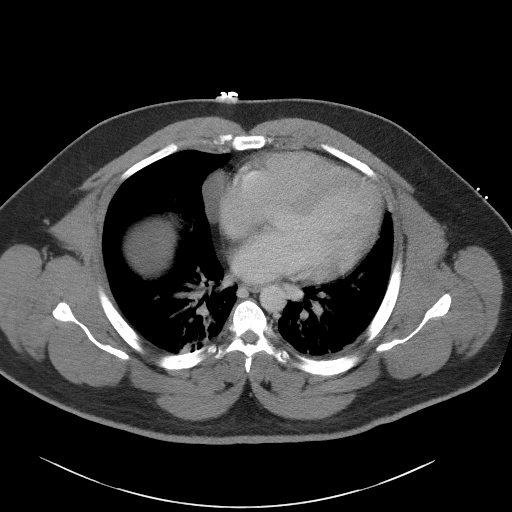

[Series 4: coronal st · coronal · 0.68mm/px · 3 of 129 slices shown]
[im 43/129  soft-tissue]
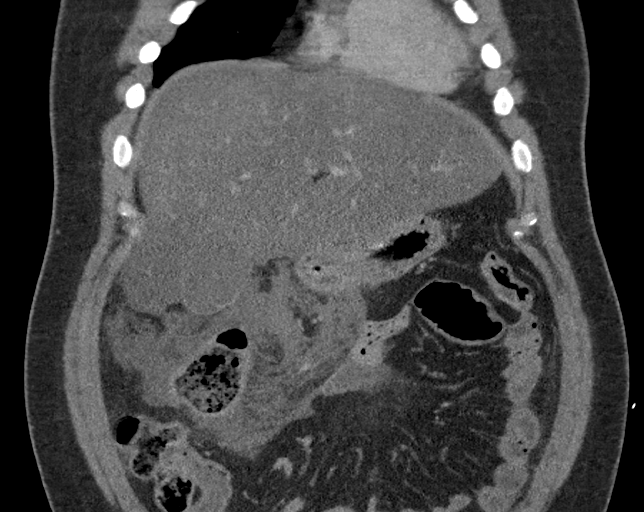
[im 57/129  soft-tissue]
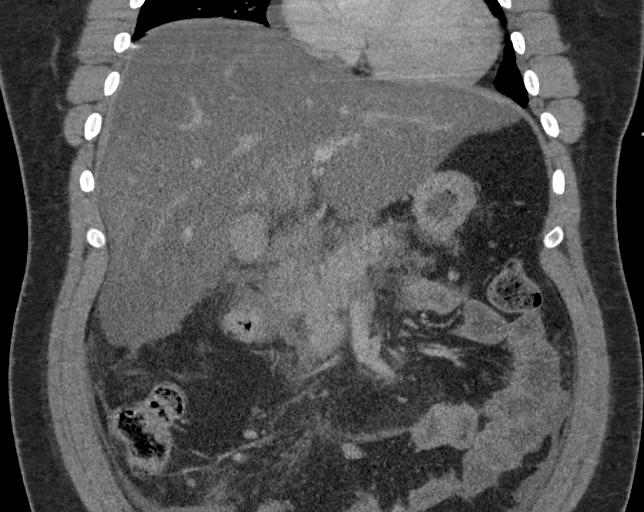
[im 72/129  soft-tissue]
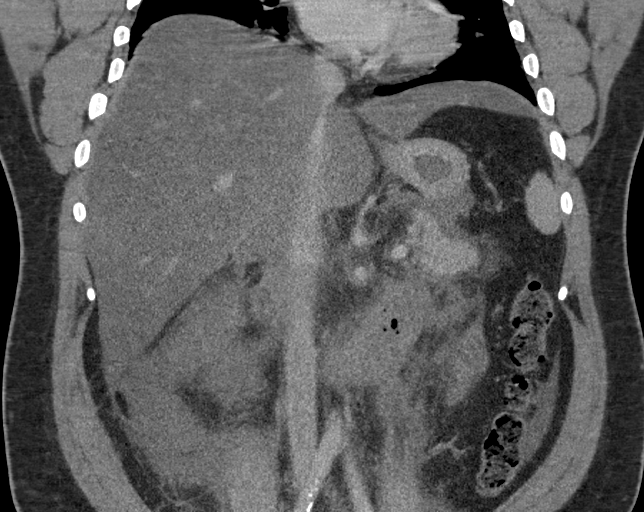

[16 of 46 positions shown; findings below may reference images not displayed]

FINDINGS: Lower chest: Increased dependent atelectasis in both lung bases as
well as tiny left pleural effusion.

Hepatobiliary: No hepatic masses identified. Severe hepatic
steatosis again seen. High attenuation in the gallbladder likely
represents excreted contrast from recent CT. No evidence of
cholecystitis or biliary ductal dilatation.

Pancreas: Acute pancreatitis is again demonstrated, with increased
peripancreatic inflammatory changes and small amount of
retroperitoneal and intraperitoneal fluid extending inferiorly into
the right lower quadrant. No evidence of pancreatic necrosis or
neoplasm. No pseudocysts identified. No evidence of pancreatic mass
or ductal dilatation.

Spleen:  Within normal limits in size and appearance.

Adrenals/Urinary Tract: No masses identified. No evidence of
hydronephrosis.

Stomach/Bowel: Moderately dilated small bowel and transverse colon,
consistent with adynamic ileus. Wall thickening of duodenum shows no
significant change and is likely secondary to acute pancreatitis.

Vascular/Lymphatic: No pathologically enlarged lymph nodes
identified. No abdominal aortic aneurysm.

Other:  None.

Musculoskeletal:  No suspicious bone lesions identified.
IMPRESSION: Worsening acute pancreatitis, with increased peripancreatic
inflammatory changes and small amount of free fluid. No evidence of
pancreatic necrosis or pseudocysts.

Worsening adynamic ileus.

Increased bilateral lower lobe atelectasis and tiny left pleural
effusion.

Severe hepatic steatosis.

## 2019-08-25 ENCOUNTER — Emergency Department (HOSPITAL_COMMUNITY): Payer: 59

## 2019-08-25 ENCOUNTER — Encounter (HOSPITAL_COMMUNITY): Payer: Self-pay

## 2019-08-25 ENCOUNTER — Inpatient Hospital Stay (HOSPITAL_COMMUNITY)
Admission: EM | Admit: 2019-08-25 | Discharge: 2019-09-01 | DRG: 637 | Disposition: A | Discharge status: Home / Self Care | Payer: 59 | Attending: Internal Medicine | Admitting: Internal Medicine

## 2019-08-25 ENCOUNTER — Other Ambulatory Visit: Payer: Self-pay

## 2019-08-25 DIAGNOSIS — E119 Type 2 diabetes mellitus without complications: Secondary | ICD-10-CM

## 2019-08-25 DIAGNOSIS — R112 Nausea with vomiting, unspecified: Secondary | ICD-10-CM | POA: Diagnosis present

## 2019-08-25 DIAGNOSIS — K859 Acute pancreatitis without necrosis or infection, unspecified: Secondary | ICD-10-CM | POA: Diagnosis present

## 2019-08-25 DIAGNOSIS — Z8249 Family history of ischemic heart disease and other diseases of the circulatory system: Secondary | ICD-10-CM | POA: Diagnosis not present

## 2019-08-25 DIAGNOSIS — E785 Hyperlipidemia, unspecified: Secondary | ICD-10-CM | POA: Diagnosis present

## 2019-08-25 DIAGNOSIS — I480 Paroxysmal atrial fibrillation: Secondary | ICD-10-CM | POA: Diagnosis present

## 2019-08-25 DIAGNOSIS — E669 Obesity, unspecified: Secondary | ICD-10-CM | POA: Diagnosis present

## 2019-08-25 DIAGNOSIS — E1110 Type 2 diabetes mellitus with ketoacidosis without coma: Secondary | ICD-10-CM | POA: Diagnosis present

## 2019-08-25 DIAGNOSIS — Z87891 Personal history of nicotine dependence: Secondary | ICD-10-CM

## 2019-08-25 DIAGNOSIS — I10 Essential (primary) hypertension: Secondary | ICD-10-CM | POA: Diagnosis present

## 2019-08-25 DIAGNOSIS — K861 Other chronic pancreatitis: Secondary | ICD-10-CM | POA: Diagnosis present

## 2019-08-25 DIAGNOSIS — K567 Ileus, unspecified: Secondary | ICD-10-CM | POA: Diagnosis not present

## 2019-08-25 DIAGNOSIS — Z7984 Long term (current) use of oral hypoglycemic drugs: Secondary | ICD-10-CM | POA: Diagnosis not present

## 2019-08-25 DIAGNOSIS — H538 Other visual disturbances: Secondary | ICD-10-CM | POA: Diagnosis present

## 2019-08-25 DIAGNOSIS — Z833 Family history of diabetes mellitus: Secondary | ICD-10-CM | POA: Diagnosis not present

## 2019-08-25 DIAGNOSIS — R7989 Other specified abnormal findings of blood chemistry: Secondary | ICD-10-CM

## 2019-08-25 DIAGNOSIS — Z79899 Other long term (current) drug therapy: Secondary | ICD-10-CM

## 2019-08-25 DIAGNOSIS — Z20822 Contact with and (suspected) exposure to covid-19: Secondary | ICD-10-CM | POA: Diagnosis present

## 2019-08-25 DIAGNOSIS — K858 Other acute pancreatitis without necrosis or infection: Secondary | ICD-10-CM | POA: Diagnosis not present

## 2019-08-25 DIAGNOSIS — R17 Unspecified jaundice: Secondary | ICD-10-CM

## 2019-08-25 DIAGNOSIS — R9431 Abnormal electrocardiogram [ECG] [EKG]: Secondary | ICD-10-CM | POA: Diagnosis not present

## 2019-08-25 DIAGNOSIS — R109 Unspecified abdominal pain: Secondary | ICD-10-CM

## 2019-08-25 DIAGNOSIS — E781 Pure hyperglyceridemia: Secondary | ICD-10-CM | POA: Diagnosis present

## 2019-08-25 DIAGNOSIS — D72823 Leukemoid reaction: Secondary | ICD-10-CM

## 2019-08-25 DIAGNOSIS — Z83438 Family history of other disorder of lipoprotein metabolism and other lipidemia: Secondary | ICD-10-CM | POA: Diagnosis not present

## 2019-08-25 DIAGNOSIS — Z6835 Body mass index (BMI) 35.0-35.9, adult: Secondary | ICD-10-CM

## 2019-08-25 DIAGNOSIS — K8689 Other specified diseases of pancreas: Secondary | ICD-10-CM

## 2019-08-25 DIAGNOSIS — E871 Hypo-osmolality and hyponatremia: Secondary | ICD-10-CM

## 2019-08-25 DIAGNOSIS — I4891 Unspecified atrial fibrillation: Secondary | ICD-10-CM | POA: Insufficient documentation

## 2019-08-25 DIAGNOSIS — E1310 Other specified diabetes mellitus with ketoacidosis without coma: Secondary | ICD-10-CM

## 2019-08-25 LAB — URINALYSIS, ROUTINE W REFLEX MICROSCOPIC
Bilirubin Urine: NEGATIVE
Glucose, UA: >=500 mg/dL — AB
Hgb urine dipstick: NEGATIVE
Ketones, ur: 80 mg/dL — AB
Leukocytes,Ua: NEGATIVE
Nitrite: NEGATIVE
Protein, ur: 30 mg/dL — AB
Specific Gravity, Urine: 1.036 — ABNORMAL HIGH (ref 1.005–1.030)
pH: 5 (ref 5.0–8.0)

## 2019-08-25 LAB — CBC
HCT: 42.5 % (ref 39.0–52.0)
Hemoglobin: 15.3 g/dL (ref 13.0–17.0)
MCH: 31.2 pg (ref 26.0–34.0)
MCHC: 36 g/dL (ref 30.0–36.0)
MCV: 86.6 fL (ref 80.0–100.0)
Platelets: 226 10*3/uL (ref 150–400)
RBC: 4.91 MIL/uL (ref 4.22–5.81)
RDW: 13.2 % (ref 11.5–15.5)
WBC: 6.2 10*3/uL (ref 4.0–10.5)
nRBC: 0 % (ref 0.0–0.2)

## 2019-08-25 LAB — COMPREHENSIVE METABOLIC PANEL
ALT: 40 U/L (ref 0–44)
AST: 82 U/L — ABNORMAL HIGH (ref 15–41)
Albumin: 3.8 g/dL (ref 3.5–5.0)
Alkaline Phosphatase: 95 U/L (ref 38–126)
Anion gap: 20 — ABNORMAL HIGH (ref 5–15)
BUN: 10 mg/dL (ref 6–20)
CO2: 12 mmol/L — ABNORMAL LOW (ref 22–32)
Calcium: 8 mg/dL — ABNORMAL LOW (ref 8.9–10.3)
Chloride: 98 mmol/L (ref 98–111)
Creatinine, Ser: 0.96 mg/dL (ref 0.61–1.24)
GFR calc Af Amer: >60 mL/min (ref >60)
GFR calc non Af Amer: >60 mL/min (ref >60)
Glucose, Bld: 242 mg/dL — ABNORMAL HIGH (ref 70–99)
Potassium: 5.7 mmol/L — ABNORMAL HIGH (ref 3.5–5.1)
Sodium: 130 mmol/L — ABNORMAL LOW (ref 135–145)
Total Bilirubin: 4.9 mg/dL — ABNORMAL HIGH (ref 0.3–1.2)

## 2019-08-25 LAB — CBG MONITORING, ED
Glucose-Capillary: 332 mg/dL — ABNORMAL HIGH (ref 70–99)
Glucose-Capillary: 211 mg/dL — ABNORMAL HIGH (ref 70–99)
Glucose-Capillary: 189 mg/dL — ABNORMAL HIGH (ref 70–99)
Glucose-Capillary: 273 mg/dL — ABNORMAL HIGH (ref 70–99)

## 2019-08-25 LAB — BLOOD GAS, VENOUS
Acid-base deficit: 13.5 mmol/L — ABNORMAL HIGH (ref 0.0–2.0)
Bicarbonate: 12.8 mmol/L — ABNORMAL LOW (ref 20.0–28.0)
O2 Saturation: 96.6 %
Patient temperature: 98.6
pCO2, Ven: 31.4 mmHg — ABNORMAL LOW (ref 44.0–60.0)
pH, Ven: 7.234 — ABNORMAL LOW (ref 7.250–7.430)
pO2, Ven: 94.8 mmHg — ABNORMAL HIGH (ref 32.0–45.0)

## 2019-08-25 LAB — GLUCOSE, CAPILLARY
Glucose-Capillary: 228 mg/dL — ABNORMAL HIGH (ref 70–99)
Glucose-Capillary: 242 mg/dL — ABNORMAL HIGH (ref 70–99)
Glucose-Capillary: 235 mg/dL — ABNORMAL HIGH (ref 70–99)
Glucose-Capillary: 283 mg/dL — ABNORMAL HIGH (ref 70–99)

## 2019-08-25 LAB — BETA-HYDROXYBUTYRIC ACID: Beta-Hydroxybutyric Acid: 4.83 mmol/L — ABNORMAL HIGH (ref 0.05–0.27)

## 2019-08-25 LAB — BILIRUBIN, DIRECT: Bilirubin, Direct: 1.5 mg/dL — ABNORMAL HIGH (ref 0.0–0.2)

## 2019-08-25 LAB — SARS CORONAVIRUS 2 BY RT PCR (HOSPITAL ORDER, PERFORMED IN ~~LOC~~ HOSPITAL LAB): SARS Coronavirus 2: NEGATIVE

## 2019-08-25 LAB — LIPASE, BLOOD: Lipase: 1035 U/L — ABNORMAL HIGH (ref 11–51)

## 2019-08-25 LAB — LACTIC ACID, PLASMA: Lactic Acid, Venous: 1.6 mmol/L (ref 0.5–1.9)

## 2019-08-25 MED ORDER — FAMOTIDINE IN NACL 20-0.9 MG/50ML-% IV SOLN
20.0000 mg | Freq: Once | INTRAVENOUS | Status: AC
  Administered 2019-08-25: 20 mg via INTRAVENOUS
  Filled 2019-08-25: qty 50

## 2019-08-25 MED ORDER — HYDROMORPHONE HCL 1 MG/ML IJ SOLN
0.5000 mg | Freq: Once | INTRAMUSCULAR | Status: AC
  Administered 2019-08-25: 0.5 mg via INTRAVENOUS
  Filled 2019-08-25: qty 1

## 2019-08-25 MED ORDER — SODIUM CHLORIDE 0.9 % IV BOLUS
1000.0000 mL | Freq: Once | INTRAVENOUS | Status: AC
  Administered 2019-08-25: 1000 mL via INTRAVENOUS

## 2019-08-25 MED ORDER — HYDROMORPHONE HCL 1 MG/ML IJ SOLN
1.0000 mg | Freq: Once | INTRAMUSCULAR | Status: AC
  Administered 2019-08-25: 1 mg via INTRAVENOUS
  Filled 2019-08-25: qty 1

## 2019-08-25 MED ORDER — DEXTROSE 50 % IV SOLN: 0.0000 mL | INTRAVENOUS | Status: DC | PRN

## 2019-08-25 MED ORDER — INSULIN REGULAR(HUMAN) IN NACL 100-0.9 UT/100ML-% IV SOLN
INTRAVENOUS | Status: DC
  Administered 2019-08-25: 4.2 [IU]/h via INTRAVENOUS
  Filled 2019-08-25: qty 100

## 2019-08-25 MED ORDER — ONDANSETRON HCL 4 MG/2ML IJ SOLN
4.0000 mg | Freq: Once | INTRAMUSCULAR | Status: AC
  Administered 2019-08-25: 4 mg via INTRAVENOUS
  Filled 2019-08-25: qty 2

## 2019-08-25 MED ORDER — PROCHLORPERAZINE EDISYLATE 10 MG/2ML IJ SOLN
10.0000 mg | INTRAMUSCULAR | Status: DC | PRN
  Administered 2019-08-26 – 2019-08-28 (×2): 10 mg via INTRAVENOUS
  Filled 2019-08-25 (×2): qty 2

## 2019-08-25 MED ORDER — DEXTROSE-NACL 5-0.45 % IV SOLN: INTRAVENOUS | Status: DC

## 2019-08-25 MED ORDER — ONDANSETRON HCL 4 MG/2ML IJ SOLN
4.0000 mg | Freq: Four times a day (QID) | INTRAMUSCULAR | Status: DC | PRN
  Administered 2019-08-25 – 2019-08-29 (×5): 4 mg via INTRAVENOUS
  Filled 2019-08-25 (×5): qty 2

## 2019-08-25 MED ORDER — SODIUM CHLORIDE 0.9% FLUSH
3.0000 mL | Freq: Once | INTRAVENOUS | Status: AC
  Administered 2019-08-25: 3 mL via INTRAVENOUS

## 2019-08-25 MED ORDER — IOHEXOL 300 MG/ML  SOLN
100.0000 mL | Freq: Once | INTRAMUSCULAR | Status: AC | PRN
  Administered 2019-08-25: 100 mL via INTRAVENOUS

## 2019-08-25 MED ORDER — ENOXAPARIN SODIUM 40 MG/0.4ML ~~LOC~~ SOLN
40.0000 mg | SUBCUTANEOUS | Status: DC
  Administered 2019-08-25 – 2019-08-26 (×2): 40 mg via SUBCUTANEOUS
  Filled 2019-08-25 (×2): qty 0.4

## 2019-08-25 MED ORDER — HYDROMORPHONE HCL 1 MG/ML IJ SOLN: 1.0000 mg | Freq: Once | INTRAMUSCULAR | Status: DC

## 2019-08-25 MED ORDER — MORPHINE SULFATE (PF) 2 MG/ML IV SOLN
2.0000 mg | INTRAVENOUS | Status: DC | PRN
  Administered 2019-08-25: 2 mg via INTRAVENOUS
  Filled 2019-08-25: qty 1

## 2019-08-25 MED ORDER — CHLORHEXIDINE GLUCONATE CLOTH 2 % EX PADS
6.0000 | MEDICATED_PAD | Freq: Every day | CUTANEOUS | Status: DC
  Administered 2019-08-25: 6 via TOPICAL

## 2019-08-25 MED ORDER — SODIUM CHLORIDE 0.9 % IV SOLN: INTRAVENOUS | Status: DC

## 2019-08-25 MED ORDER — LABETALOL HCL 5 MG/ML IV SOLN
10.0000 mg | INTRAVENOUS | Status: DC | PRN
  Administered 2019-08-27: 10 mg via INTRAVENOUS
  Filled 2019-08-25: qty 4

## 2019-08-25 MED ORDER — PROMETHAZINE HCL 25 MG/ML IJ SOLN
12.5000 mg | Freq: Once | INTRAMUSCULAR | Status: AC
  Administered 2019-08-25: 12.5 mg via INTRAVENOUS
  Filled 2019-08-25: qty 1

## 2019-08-25 MED ORDER — INSULIN REGULAR(HUMAN) IN NACL 100-0.9 UT/100ML-% IV SOLN
INTRAVENOUS | Status: DC
  Filled 2019-08-25 (×3): qty 100

## 2019-08-25 MED ORDER — HYDROMORPHONE HCL 1 MG/ML IJ SOLN
0.5000 mg | INTRAMUSCULAR | Status: AC | PRN
  Administered 2019-08-25 – 2019-08-26 (×4): 0.5 mg via INTRAVENOUS
  Filled 2019-08-25 (×4): qty 1

## 2019-08-25 MED ORDER — HYDRALAZINE HCL 20 MG/ML IJ SOLN
10.0000 mg | INTRAMUSCULAR | Status: DC | PRN
  Administered 2019-08-25: 10 mg via INTRAVENOUS
  Filled 2019-08-25: qty 1

## 2019-08-25 MED ORDER — SODIUM CHLORIDE (PF) 0.9 % IJ SOLN
INTRAMUSCULAR | Status: AC
  Filled 2019-08-25: qty 50

## 2019-08-25 NOTE — ED Notes (Signed)
Report called to [facility], RN room 1236, receiving RN was in a room with a patient and asked to call back.

## 2019-08-25 NOTE — Assessment & Plan Note (Signed)
-  Follow-up A1c -Patient states he takes Metformin only at home, no insulin -Will reinitiate therapy after resolution of DKA and pancreatitis

## 2019-08-25 NOTE — ED Notes (Signed)
Phlebotomy contacted to redraw lab

## 2019-08-25 NOTE — Progress Notes (Signed)
Inpatient Diabetes Program Recommendations  AACE/ADA: New Consensus Statement on Inpatient Glycemic Control (2015)  Target Ranges:  Prepandial:   less than 140 mg/dL      Peak postprandial:   less than 180 mg/dL (1-2 hours)      Critically ill patients:  140 - 180 mg/dL   Lab Results  Component Value Date   GLUCAP 332 (H) 08/25/2019   HGBA1C 8.9 (H) 09/16/2017    Review of Glycemic Control  Diabetes history: DM2 (diagnosed 09/2017) Outpatient Diabetes medications: Glucotrol 5 mg + Metformin 500 mg bid (? If taking 1 gm am + 500 mg pm Current orders for Inpatient glycemic control: None  Inpatient Diabetes Program Recommendations:   -Glycemic control order set for CBGs q 4 hrs with Novolog sensitive correction while NPO. Secure chat sent to [facility].  Thank you, Billy Fischer. Esequiel Kleinfelter, RN, MSN, CDE  Diabetes Coordinator Inpatient Glycemic Control Team Team Pager [phone] (8am-5pm) 08/25/2019 12:58 PM

## 2019-08-25 NOTE — ED Notes (Signed)
Per lab, light green needs to be redrawn.  Sample had to be sent to [facility] d/t being lipemic and was hemolyzed.

## 2019-08-25 NOTE — Assessment & Plan Note (Addendum)
-  Patient denies alcohol use.  Meets criteria for pancreatitis in setting of lipase, CT abd/pelvis, clinical exam -Etiology is considered due to hypertriglyceridemia.  TG 8,286 initially -DKA has resolved - will transition to hyperTG pancreatitis protocol. Use insulin drip 0.1 units/hg/hr (glucose has been unable to tolerate so have lowered insulin rate for now and will try and keep fixed while titrating dextrose infusions) - start on D5W and NS; will adjust drips as necessary - continue q1h CBG checks - see hyperTG -Continue Zofran, Compazine, dilaudid -keep NPO; ice chips okay - still at risk for ileus development; BS better today; continue serial abdominal imaging and will treat constipation once able to give PO - trend lipase and lipid profile

## 2019-08-25 NOTE — ED Notes (Signed)
New light green tube blood sample obtained and walked down to lab.

## 2019-08-25 NOTE — ED Provider Notes (Signed)
[facility] DEPT Provider Note   CSN: [id number] Arrival date & time: 08/25/19  0435     History Chief Complaint  Patient presents with  . Hyperglycemia  . Abdominal Pain    Douglas [name]. is a 34 y.o. male.  HPI   Pt is a 34 y/o male with a h/o DM, HLD, HTN, pancreatitis who presents to the ED today for eval of epigastric abd pain that started at 2pm this morning. Pain feels like nails and feels similar to when he had pancreatitis in the past. Rates pain 10/10.  Reports nausea, but denies vomiting, diarrhea, constipation.  Additionally, she states for the last week he has had subjective fevers, cough, chest congestion, fatigue. Denies SOB. Also reports nasal congestion. States his children have been sick with similar sxs.   Pt has been vaccinated against COVID.   Does not drink ETOH regularly. States prior pancreatitis episode was from hypertriglyceridemia.    Past Medical History:  Diagnosis Date  . Diabetes ([place])   . Hypercholesteremia   . Hypertension   . Pancreatitis     Patient Active Problem List   Diagnosis Date Noted  . History of pancreatitis 11/28/2017  . Hypertriglyceridemia 11/28/2017  . Peripancreatic fluid collection 11/28/2017  . Rectal bleeding 11/28/2017  . Acute pancreatitis 09/16/2017  . Abdominal pain 09/16/2017  . Leukocytosis 09/16/2017  . Hyponatremia 09/16/2017  . Abnormal LFTs 09/16/2017  . Obesity, Class III, BMI 40-49.9 (morbid obesity) ([place]) 09/16/2017  . Hypertension 09/16/2017  . Pancreatitis 09/16/2017  . Acute pancreatitis without infection or necrosis 09/16/2017    Past Surgical History:  Procedure Laterality Date  . TONSILLECTOMY    . WISDOM TOOTH EXTRACTION         Family History  Problem Relation Age of Onset  . Hypercholesterolemia Mother   . Hypertension Mother   . Diabetes Father   . Colon cancer Neg Hx   . Esophageal cancer Neg Hx   . Inflammatory bowel disease Neg Hx     . Liver disease Neg Hx   . Pancreatic cancer Neg Hx   . Rectal cancer Neg Hx   . Stomach cancer Neg Hx     Social History   Tobacco Use  . Smoking status: Former Research scientist (life sciences)  . Smokeless tobacco: Never Used  . Tobacco comment: "teenage years"  Vaping Use  . Vaping Use: Never used  Substance Use Topics  . Alcohol use: No  . Drug use: No    Home Medications Prior to Admission medications   Medication Sig Start Date End Date Taking? Authorizing Provider  atorvastatin (LIPITOR) 40 MG tablet Take 1 tablet (40 mg total) by mouth daily at 6 PM. 09/22/17   Cristal Ford, DO  blood glucose meter kit and supplies Dispense based on patient and insurance preference. Use up to four times daily as directed. (FOR ICD-10 E10.9, E11.9). 09/22/17   Mikhail, Velta Addison, DO  fenofibrate 160 MG tablet Take 160 mg by mouth daily. 10/30/16   [provider]  hydrochlorothiazide (HYDRODIURIL) 25 MG tablet Take 25 mg by mouth daily. 10/29/16   [provider]  ibuprofen (ADVIL,MOTRIN) 800 MG tablet Take 800 mg by mouth daily as needed for pain. 09/24/16   [provider]  metFORMIN (GLUCOPHAGE) 500 MG tablet Take 1 tablet (500 mg total) by mouth 2 (two) times daily with a meal. 09/22/17 10/22/17  Mikhail, Velta Addison, DO  metFORMIN (GLUCOPHAGE) 500 MG tablet Take 2 tablets in the AM and 1  tablet in the PM    [provider]    Allergies    Patient has no known allergies.  Review of Systems   Review of Systems  Constitutional: Positive for fatigue and fever.  HENT: Positive for congestion. Negative for ear pain and sore throat.   Eyes: Negative for visual disturbance.  Respiratory: Positive for cough. Negative for shortness of breath.   Cardiovascular: Negative for chest pain.  Gastrointestinal: Positive for abdominal pain and nausea. Negative for constipation, diarrhea and vomiting.  Genitourinary: Negative for dysuria and hematuria.  Musculoskeletal: Negative for back pain.   Skin: Negative for rash.  Neurological: Negative for headaches.  All other systems reviewed and are negative.   Physical Exam Updated Vital Signs BP (!) 153/103 (BP Location: Left Arm)   Pulse 96   Temp 98.4 F (36.9 C) (Oral)   Resp 17   Ht 6' (1.829 m)   Wt 117.9 kg   SpO2 98%   BMI 35.26 kg/m   Physical Exam Vitals and nursing note reviewed.  Constitutional:      Appearance: He is well-developed.  HENT:     Head: Normocephalic and atraumatic.  Eyes:     Conjunctiva/sclera: Conjunctivae normal.  Cardiovascular:     Rate and Rhythm: Normal rate and regular rhythm.     Heart sounds: Normal heart sounds. No murmur heard.   Pulmonary:     Effort: Pulmonary effort is normal. No respiratory distress.     Breath sounds: Normal breath sounds. No wheezing, rhonchi or rales.  Abdominal:     Palpations: Abdomen is soft.     Tenderness: There is generalized abdominal tenderness and tenderness in the right upper quadrant and left upper quadrant. There is no guarding or rebound.  Musculoskeletal:     Cervical back: Neck supple.  Skin:    General: Skin is warm and dry.  Neurological:     Mental Status: He is alert.     ED Results / Procedures / Treatments   Labs (all labs ordered are listed, but only abnormal results are displayed) Labs Reviewed  URINALYSIS, ROUTINE W REFLEX MICROSCOPIC - Abnormal; Notable for the following components:      Result Value   Color, Urine STRAW (*)    Specific Gravity, Urine 1.036 (*)    Glucose, UA >=500 (*)    Ketones, ur 80 (*)    Protein, ur 30 (*)    Bacteria, UA RARE (*)    All other components within normal limits  CBG MONITORING, ED - Abnormal; Notable for the following components:   Glucose-Capillary 332 (*)    All other components within normal limits  SARS CORONAVIRUS 2 BY RT PCR (HOSPITAL ORDER, [facility])  CBC  LIPASE, BLOOD  COMPREHENSIVE METABOLIC PANEL    EKG None  Radiology DG Chest  Portable 1 View  Result Date: 08/25/2019 CLINICAL DATA:  Cough. EXAM: PORTABLE CHEST 1 VIEW COMPARISON:  No prior. FINDINGS: Mediastinum hilar structures normal. Heart size normal. No focal infiltrate. No pleural effusion or pneumothorax. No acute bony abnormality. IMPRESSION: No acute cardiopulmonary disease. Electronically Signed   By: Marcello Moores  Register   On: 08/25/2019 06:19    Procedures Procedures (including critical care time)  Medications Ordered in ED Medications  HYDROmorphone (DILAUDID) injection 1 mg (has no administration in time range)  sodium chloride flush (NS) 0.9 % injection 3 mL (3 mLs Intravenous Given 08/25/19 0612)  sodium chloride 0.9 % bolus 1,000 mL (1,000 mLs  Intravenous New Bag/Given 08/25/19 0612)  famotidine (PEPCID) IVPB 20 mg premix (20 mg Intravenous New Bag/Given 08/25/19 0610)  ondansetron (ZOFRAN) injection 4 mg (4 mg Intravenous Given 08/25/19 0611)  HYDROmorphone (DILAUDID) injection 1 mg (1 mg Intravenous Given 08/25/19 9211)    ED Course  I have reviewed the triage vital signs and the nursing notes.  Pertinent labs & imaging results that were available during my care of the patient were reviewed by me and considered in my medical decision making (see chart for details).    MDM Rules/Calculators/A&P                          34 year old male presenting for evaluation of epigastric pain that feels similar to his prior pancreatitis.  Associated with nausea but no vomiting.  Has also had URI symptoms for 1 week and is concerned about his sugars being elevated.  He has been compliant with his medications.  Reviewed/interpreted labs CBC is without leukocytosis or anemia UA with glucosuria, ketonuria, proteinuria.  CXR with no active cardiopulmonary disease.  Patient given IV fluids, antiemetics, pain medication. At shift change, pt pending labs. Care transitioned to [facility], PA-C with plan to f/u on pending labs. Pt will need to be reassessed after  meds/ivf. If sxs still severe or abnormal labs, may need to consider CT imaging.   Final Clinical Impression(s) / ED Diagnoses Final diagnoses:  Abdominal pain, unspecified abdominal location    Rx / DC Orders ED Discharge Orders    None       Bishop Dublin 08/25/19 0654    Ezequiel Essex, MD 08/25/19 [phone]

## 2019-08-25 NOTE — Assessment & Plan Note (Addendum)
-   resolved with treatment; d/c protocol on 7/11 - see pancreatitis

## 2019-08-25 NOTE — H&P (Signed)
History and Physical    Douglas [name].  WCB:[id number]  DOB: 1985/08/11  DOA: 08/25/2019  PCP: Tomasa Hose, NP Patient coming from: home  Chief Complaint: N/V, abdominal pain  HPI:  Douglas [name] is a 34 year old African-American male with PMH obesity, hypertriglyceride induced pancreatitis, GERD, type 2 diabetes, hypertension, hyperlipidemia who presented to the hospital with severe abdominal pain, nausea, vomiting.  He states the pain started around 2 AM this morning and awoke him from sleep.  He has had ongoing nausea and vomiting throughout the day and presented for further evaluation.  In the ER he underwent work-up including CT abdomen/pelvis which revealed diffuse peripancreatic edema, fat stranding, and free fluid around the pancreas consistent with acute edematous pancreatitis.  No pancreatic necrosis appreciated or abscess. Notable labs include: WBC 6.2, Hgb 15.3, PLTC 226 Na 130, K 5.7, Co2 12, Glucose 242, BUN 10, Creat 0.96, AG 20, TB 4.9, AST 82, ALT Lipase 1035 VBG 7.23/31 Beta hydroxybutyrate 4.83  During interview in the ER patient had severe episode of projectile vomit all over himself. He endorsed pain and N/V as noted above. He is compliant with all home meds.   He is admitted for further treatment of DKA and presumed hyperTG induced pancreatitis pending further labs to confirm or rule out.  He denies alcohol use.    I have personally briefly reviewed patient's old medical records in [place]  Assessment/Plan: DKA (diabetic ketoacidoses) ([place]) - continue on DKA protocol - follow up Mg and Phos - trend BMP - will transition off insulin drip as able (see pancreatitis)  Acute pancreatitis -Patient denies alcohol use.  Meets all criteria for pancreatitis in setting of lipase, CT, clinical exam -Etiology is considered due to hypertriglyceridemia.  Lipid panel is pending -Continue on same treatment for DKA, however if DKA resolves prior to  triglycerides less than 500, will transition to fixed dose insulin drip for ongoing treatment of hypertriglyceride induced pancreatitis -Continue fluids -Continue Zofran, Compazine -Continue morphine for pain control  Hypertension -Use labetalol or hydralazine as needed  Type 2 diabetes mellitus without complication ([place]) -Follow-up A1c -Patient states he takes Metformin only at home, no insulin -Will reinitiate therapy after resolution of DKA and pancreatitis  Hyperlipidemia -Last lipid panel reviewed from August 2019.  Triglycerides were over 1000 at that time -Patient takes Lipitor and fenofibrate at home.  Will max out both once he is tolerating p.o. again.  Will also need to add on fish oil 2 g twice daily -Follow-up lipid profile and trend every 12 hours while treating pancreatitis   Code Status: Full DVT Prophylaxis:enoxaparin (Lovenox) 22m SQ 2 hours prior to surgery then every day Anticipated disposition is to home  History: Past Medical History:  Diagnosis Date  . Diabetes ([place]   . Hypercholesteremia   . Hypertension   . Pancreatitis     Past Surgical History:  Procedure Laterality Date  . TONSILLECTOMY    . WISDOM TOOTH EXTRACTION       reports that he has quit smoking. He has never used smokeless tobacco. He reports that he does not drink alcohol and does not use drugs.  No Known Allergies  Family History  Problem Relation Age of Onset  . Hypercholesterolemia Mother   . Hypertension Mother   . Diabetes Father   . Colon cancer Neg Hx   . Esophageal cancer Neg Hx   . Inflammatory bowel disease Neg Hx   . Liver disease Neg Hx   .  Pancreatic cancer Neg Hx   . Rectal cancer Neg Hx   . Stomach cancer Neg Hx    Home Medications: Prior to Admission medications   Medication Sig Start Date End Date Taking? Authorizing Provider  atorvastatin (LIPITOR) 40 MG tablet Take 1 tablet (40 mg total) by mouth daily at 6 PM. 09/22/17  Yes Mikhail, Velta Addison, DO   fenofibrate 160 MG tablet Take 160 mg by mouth daily. 10/30/16  Yes [provider]  glipiZIDE (GLUCOTROL) 5 MG tablet Take 5 mg by mouth 2 (two) times daily. 05/08/19  Yes [provider]  hydrochlorothiazide (HYDRODIURIL) 25 MG tablet Take 25 mg by mouth daily. 10/29/16  Yes [provider]  ibuprofen (ADVIL,MOTRIN) 800 MG tablet Take 800 mg by mouth daily as needed for pain. 09/24/16  Yes [provider]  metFORMIN (GLUCOPHAGE) 500 MG tablet Take 2 tablets in the AM and 1 tablet in the PM   Yes [provider]  pantoprazole (PROTONIX) 40 MG tablet Take 40 mg by mouth daily. 06/26/19  Yes [provider]  blood glucose meter kit and supplies Dispense based on patient and insurance preference. Use up to four times daily as directed. (FOR ICD-10 E10.9, E11.9). 09/22/17   Cristal Ford, DO  metFORMIN (GLUCOPHAGE) 500 MG tablet Take 1 tablet (500 mg total) by mouth 2 (two) times daily with a meal. 09/22/17 10/22/17  Cristal Ford, DO    Review of Systems:  Pertinent items are noted in HPI.  Physical Exam: Vitals:   08/25/19 1200 08/25/19 1400 08/25/19 1541 08/25/19 1752  BP: (!) 156/107 (!) 161/115 (!) 150/107 (!) 149/89  Pulse: (!) 101 99 (!) 102 99  Resp: 20 18 (!) 26 18  Temp:      TempSrc:      SpO2: 96% 96% 95% 97%  Weight:      Height:       General appearance: Well-developed young adult man laying in bed uncomfortable and actively vomiting while holding abdomen with noticeable pain Head: Normocephalic, without obvious abnormality Eyes: EOMI Lungs: clear to auscultation bilaterally Heart: S1, S2 normal and Tachycardic, regular rhythm Abdomen: tender throughout abdomen with mild guarding, no rebound; BS present Extremities: no edema Skin: mobility and turgor normal Neurologic: Grossly normal  Labs on Admission:  I have personally reviewed following labs and imaging studies Results for orders placed or performed during the  hospital encounter of 08/25/19 (from the past 24 hour(s))  CBC     Status: None   Collection Time: 08/25/19  4:59 AM  Result Value Ref Range   WBC 6.2 4.0 - 10.5 K/uL   RBC 4.91 4.22 - 5.81 MIL/uL   Hemoglobin 15.3 13.0 - 17.0 g/dL   HCT 42.5 39 - 52 %   MCV 86.6 80.0 - 100.0 fL   MCH 31.2 26.0 - 34.0 pg   MCHC 36.0 30.0 - 36.0 g/dL   RDW 13.2 11.5 - 15.5 %   Platelets 226 150 - 400 K/uL   nRBC 0.0 0.0 - 0.2 %  Urinalysis, Routine w reflex microscopic     Status: Abnormal   Collection Time: 08/25/19  4:59 AM  Result Value Ref Range   Color, Urine STRAW (A) YELLOW   APPearance CLEAR CLEAR   Specific Gravity, Urine 1.036 (H) 1.005 - 1.030   pH 5.0 5.0 - 8.0   Glucose, UA >=500 (A) NEGATIVE mg/dL   Hgb urine dipstick NEGATIVE NEGATIVE   Bilirubin Urine NEGATIVE NEGATIVE   Ketones, ur 80 (  A) NEGATIVE mg/dL   Protein, ur 30 (A) NEGATIVE mg/dL   Nitrite NEGATIVE NEGATIVE   Leukocytes,Ua NEGATIVE NEGATIVE   RBC / HPF 0-5 0 - 5 RBC/hpf   WBC, UA 0-5 0 - 5 WBC/hpf   Bacteria, UA RARE (A) NONE SEEN   Mucus PRESENT   CBG monitoring, ED     Status: Abnormal   Collection Time: 08/25/19  5:25 AM  Result Value Ref Range   Glucose-Capillary 332 (H) 70 - 99 mg/dL  SARS Coronavirus 2 by RT PCR (hospital order, performed in [facility] hospital lab) Nasopharyngeal Nasopharyngeal Swab     Status: None   Collection Time: 08/25/19  5:44 AM   Specimen: Nasopharyngeal Swab  Result Value Ref Range   SARS Coronavirus 2 NEGATIVE NEGATIVE  Comprehensive metabolic panel     Status: Abnormal   Collection Time: 08/25/19 12:00 PM  Result Value Ref Range   Sodium 130 (L) 135 - 145 mmol/L   Potassium 5.7 (H) 3.5 - 5.1 mmol/L   Chloride 98 98 - 111 mmol/L   CO2 12 (L) 22 - 32 mmol/L   Glucose, Bld 242 (H) 70 - 99 mg/dL   BUN 10 6 - 20 mg/dL   Creatinine, Ser 0.96 0.61 - 1.24 mg/dL   Calcium 8.0 (L) 8.9 - 10.3 mg/dL   Total Protein RESULTS UNAVAILABLE DUE TO INTERFERING SUBSTANCE 6.5 - 8.1 g/dL    Albumin 3.8 3.5 - 5.0 g/dL   AST 82 (H) 15 - 41 U/L   ALT 40 0 - 44 U/L   Alkaline Phosphatase 95 38 - 126 U/L   Total Bilirubin 4.9 (H) 0.3 - 1.2 mg/dL   GFR calc non Af Amer >60 >60 mL/min   GFR calc Af Amer >60 >60 mL/min   Anion gap 20 (H) 5 - 15  Lipase, blood     Status: Abnormal   Collection Time: 08/25/19 12:00 PM  Result Value Ref Range   Lipase 1,035 (H) 11 - 51 U/L  Blood gas, venous     Status: Abnormal   Collection Time: 08/25/19  2:54 PM  Result Value Ref Range   pH, Ven 7.234 (L) 7.25 - 7.43   pCO2, Ven 31.4 (L) 44 - 60 mmHg   pO2, Ven 94.8 (H) 32 - 45 mmHg   Bicarbonate 12.8 (L) 20.0 - 28.0 mmol/L   Acid-base deficit 13.5 (H) 0.0 - 2.0 mmol/L   O2 Saturation 96.6 %   Patient temperature 98.6   CBG monitoring, ED     Status: Abnormal   Collection Time: 08/25/19  3:02 PM  Result Value Ref Range   Glucose-Capillary 211 (H) 70 - 99 mg/dL  Beta-hydroxybutyric acid     Status: Abnormal   Collection Time: 08/25/19  3:30 PM  Result Value Ref Range   Beta-Hydroxybutyric Acid 4.83 (H) 0.05 - 0.27 mmol/L  CBG monitoring, ED     Status: Abnormal   Collection Time: 08/25/19  4:37 PM  Result Value Ref Range   Glucose-Capillary 189 (H) 70 - 99 mg/dL  CBG monitoring, ED     Status: Abnormal   Collection Time: 08/25/19  5:55 PM  Result Value Ref Range   Glucose-Capillary 273 (H) 70 - 99 mg/dL     Radiological Exams on Admission: CT ABDOMEN PELVIS W CONTRAST  Result Date: 08/25/2019 CLINICAL DATA:  Epigastric pain. History of pancreatitis. Elevated lipase. EXAM: CT ABDOMEN AND PELVIS WITH CONTRAST TECHNIQUE: Multidetector CT imaging of the abdomen and pelvis was performed  using the standard protocol following bolus administration of intravenous contrast. CONTRAST:  170m OMNIPAQUE IOHEXOL 300 MG/ML  SOLN COMPARISON:  CT 09/18/2017 FINDINGS: Lower chest: No consolidation or pleural fluid. Hepatobiliary: Mild diffuse steatosis without evidence of focal lesion. Physiologically  distended gallbladder. No calcified gallstone. Common bile duct is not well-defined but no evidence of biliary dilatation. Pancreas: Diffuse peripancreatic edema, fat stranding, and free fluid about the entirety of the pancreas and retroperitoneum. Homogeneous pancreatic enhancement without evidence of necrosis. Non organized free fluid tracks into the upper abdomen, right pericolic gutter car lesser sac and retroperitoneum without organized fluid collection. No soft tissue air. Splenic vein is patent. Spleen: Normal in size without focal abnormality. Adrenals/Urinary Tract: Normal adrenal glands. No hydronephrosis or perinephric edema. There is slight prominence of the right renal collecting system and ureter which is likely reactive. No obstructive change. Homogeneous renal enhancement with symmetric excretion on delayed phase imaging. Urinary bladder is physiologically distended without wall thickening. Stomach/Bowel: Intraluminal fluid in the stomach. Mild mucosal enhancement involving the peri pyloric stomach and duodenum without obvious wall thickening, likely reactive. There is no small bowel dilatation, inflammation or obstruction. High-riding cecum in the right mid abdomen. Normal appendix courses in the right lower quadrant. Small to moderate volume of colonic stool. There is no colonic wall thickening or inflammation. Vascular/Lymphatic: Normal caliber abdominal aorta. Patent portal and splenic veins. No acute vascular findings. No abdominopelvic adenopathy. Reproductive: Prostate is unremarkable. Other: Inflammatory changes stranding and non organized free fluid in the upper abdomen related to pancreatic inflammation. Free fluid tracks into the upper abdomen, right upper quadrant, right pericolic gutter, retroperitoneum, and lesser sac. Small amount of free fluid is tract into the pelvis. No organized fluid collection. No free air. Tiny fat containing umbilical hernia. Musculoskeletal: Chronic bilateral  L5 pars interarticularis defects. No significant listhesis. There are no acute or suspicious osseous abnormalities. IMPRESSION: 1. Acute edematous pancreatitis. Significant peripancreatic inflammatory changes. No evidence of pancreatic necrosis or acute peripancreatic fluid collection. 2. Mild hepatic steatosis. 3. Chronic bilateral L5 pars interarticularis defects without significant listhesis. Electronically Signed   By: MKeith RakeM.D.   On: 08/25/2019 16:34   DG Chest Portable 1 View  Result Date: 08/25/2019 CLINICAL DATA:  Cough. EXAM: PORTABLE CHEST 1 VIEW COMPARISON:  No prior. FINDINGS: Mediastinum hilar structures normal. Heart size normal. No focal infiltrate. No pleural effusion or pneumothorax. No acute bony abnormality. IMPRESSION: No acute cardiopulmonary disease. Electronically Signed   By: TMarcello Moores Register   On: 08/25/2019 06:19   CT ABDOMEN PELVIS W CONTRAST  Final Result    DG Chest Portable 1 View  Final Result      Consults called:  n/a    DDwyane Dee MD [facility] Pager: Secure chat  If 7PM-7AM, please contact night-coverage [email or web address] Use universal [place] password for that web site. If you do not have the password, please call the hospital operator.  08/25/2019, 6:04 PM

## 2019-08-25 NOTE — Assessment & Plan Note (Addendum)
-  Last lipid panel reviewed from August 2019.  Triglycerides were over 1000 at that time -Patient takes Lipitor and fenofibrate at home.  Will max out both once he is tolerating p.o. again.  Will also need to add on fish oil 2 g twice daily -Follow-up lipid profile and trend every 12 hours while treating pancreatitis. Initial TG 8,286; continuing to downtrend; goal is TG<500 before discontinuing insulin infusion - see pancreatitis as well

## 2019-08-25 NOTE — ED Triage Notes (Signed)
Pt c/o hyperglycemia x a couple days and generalized abdominal pain starting this morning.  Pain score 10/10.  Pt reports taking 800mg  ibuprofen w/o relief.  Denies n/v/d.  Pt reports children have had a recent viral infection.  Pt concerned about hyperglycemia.  Sts "something is wrong.  It's been high, but I've recently lost 60lbs and been taking my medications."  Pt is supposed to follow up w/ PCP this afternoon.

## 2019-08-25 NOTE — Assessment & Plan Note (Signed)
-  Use labetalol or hydralazine as needed

## 2019-08-25 NOTE — ED Provider Notes (Signed)
Care transferred from previous provider Joldersma, PA-C at shift change.  See note for full HPI.  In summation 34 year old history of recurrent pancreatitis followed by GI in [place] that likely due to elevated triglycerides who presents for evaluation of epigastric pain and concern for DKA.  Elevated CBG, Bicarb 12, ketonuria. Likely DKA. Elevated T bili at 4.9, anion gap 20.  Ketonuria, elevated lipase to 1000 which is significantly elevated from his baseline.  Covid negative.  Pending beta hydroxy, CT abdomen to assess for possible choledocholithiasis.  Has received multiple doses of IV pain medications, fluids and antiemetics with continued pain.  Plan on admission for DKA and intractable pain due to pancreatitis Physical Exam  BP (!) 149/89   Pulse 99   Temp 98.4 F (36.9 C) (Oral)   Resp 18   Ht 6' (1.829 m)   Wt 117.9 kg   SpO2 97%   BMI 35.26 kg/m   Physical Exam Vitals and nursing note reviewed.  Constitutional:      General: He is not in acute distress.    Appearance: He is well-developed. He is obese. He is not ill-appearing, toxic-appearing or diaphoretic.  HENT:     Head: Atraumatic.     Mouth/Throat:     Pharynx: Oropharynx is clear.  Eyes:     Pupils: Pupils are equal, round, and reactive to light.  Cardiovascular:     Rate and Rhythm: Regular rhythm. Tachycardia present.  Pulmonary:     Effort: Pulmonary effort is normal. No respiratory distress.  Abdominal:     General: Bowel sounds are normal. There is no distension.     Palpations: Abdomen is soft.     Tenderness: There is abdominal tenderness in the epigastric area. There is guarding. There is no rebound.     Hernia: No hernia is present.  Musculoskeletal:        General: Normal range of motion.     Cervical back: Normal range of motion and neck supple.  Skin:    General: Skin is warm and dry.  Neurological:     General: No focal deficit present.     Mental Status: He is alert.     ED  Course/Procedures   Clinical Course as of Aug 25 1851  Fri Aug 25, 2019  1449 Corrected sodium for hyperglycemia is 132  Sodium(!): 130 [LJ]  1451 Potassium(!): 5.7 [LJ]  1451 Anion gap(!): 20 [LJ]  1452 Ketones, ur(!): 80 [LJ]  1452 Specific Gravity, Urine(!): 1.036 [LJ]  1452 Glucose, UA(!): >=500 [LJ]  1452 Glucose(!): 242 [LJ]  1458 Will obtain a CT scan of the abdomen and pelvis  Lipase(!): 1,035 [LJ]  1500 SARS Coronavirus 2: NEGATIVE [LJ]    Clinical Course User Index [LJ] Placido Sou, PA-C   Labs Reviewed  URINALYSIS, ROUTINE W REFLEX MICROSCOPIC - Abnormal; Notable for the following components:      Result Value   Color, Urine STRAW (*)    Specific Gravity, Urine 1.036 (*)    Glucose, UA >=500 (*)    Ketones, ur 80 (*)    Protein, ur 30 (*)    Bacteria, UA RARE (*)    All other components within normal limits  COMPREHENSIVE METABOLIC PANEL - Abnormal; Notable for the following components:   Sodium 130 (*)    Potassium 5.7 (*)    CO2 12 (*)    Glucose, Bld 242 (*)    Calcium 8.0 (*)    AST 82 (*)    Total Bilirubin  4.9 (*)    Anion gap 20 (*)    All other components within normal limits  LIPASE, BLOOD - Abnormal; Notable for the following components:   Lipase 1,035 (*)    All other components within normal limits  BETA-HYDROXYBUTYRIC ACID - Abnormal; Notable for the following components:   Beta-Hydroxybutyric Acid 4.83 (*)    All other components within normal limits  BLOOD GAS, VENOUS - Abnormal; Notable for the following components:   pH, Ven 7.234 (*)    pCO2, Ven 31.4 (*)    pO2, Ven 94.8 (*)    Bicarbonate 12.8 (*)    Acid-base deficit 13.5 (*)    All other components within normal limits  CBG MONITORING, ED - Abnormal; Notable for the following components:   Glucose-Capillary 332 (*)    All other components within normal limits  CBG MONITORING, ED - Abnormal; Notable for the following components:   Glucose-Capillary 211 (*)    All other  components within normal limits  CBG MONITORING, ED - Abnormal; Notable for the following components:   Glucose-Capillary 189 (*)    All other components within normal limits  CBG MONITORING, ED - Abnormal; Notable for the following components:   Glucose-Capillary 273 (*)    All other components within normal limits  SARS CORONAVIRUS 2 BY RT PCR (HOSPITAL ORDER, PERFORMED IN [facility])  CBC  BETA-HYDROXYBUTYRIC ACID  BILIRUBIN, DIRECT  LIPID PANEL  LACTIC ACID, PLASMA  MAGNESIUM  PHOSPHORUS  CT ABDOMEN PELVIS W CONTRAST  Result Date: 08/25/2019 CLINICAL DATA:  Epigastric pain. History of pancreatitis. Elevated lipase. EXAM: CT ABDOMEN AND PELVIS WITH CONTRAST TECHNIQUE: Multidetector CT imaging of the abdomen and pelvis was performed using the standard protocol following bolus administration of intravenous contrast. CONTRAST:  OMNIPAQUE IOHEXOL 300 MG/ML  SOLN COMPARISON:  CT 09/18/2017 FINDINGS: Lower chest: No consolidation or pleural fluid. Hepatobiliary: Mild diffuse steatosis without evidence of focal lesion. Physiologically distended gallbladder. No calcified gallstone. Common bile duct is not well-defined but no evidence of biliary dilatation. Pancreas: Diffuse peripancreatic edema, fat stranding, and free fluid about the entirety of the pancreas and retroperitoneum. Homogeneous pancreatic enhancement without evidence of necrosis. Non organized free fluid tracks into the upper abdomen, right pericolic gutter car lesser sac and retroperitoneum without organized fluid collection. No soft tissue air. Splenic vein is patent. Spleen: Normal in size without focal abnormality. Adrenals/Urinary Tract: Normal adrenal glands. No hydronephrosis or perinephric edema. There is slight prominence of the right renal collecting system and ureter which is likely reactive. No obstructive change. Homogeneous renal enhancement with symmetric excretion on delayed phase imaging. Urinary bladder  is physiologically distended without wall thickening. Stomach/Bowel: Intraluminal fluid in the stomach. Mild mucosal enhancement involving the peri pyloric stomach and duodenum without obvious wall thickening, likely reactive. There is no small bowel dilatation, inflammation or obstruction. High-riding cecum in the right mid abdomen. Normal appendix courses in the right lower quadrant. Small to moderate volume of colonic stool. There is no colonic wall thickening or inflammation. Vascular/Lymphatic: Normal caliber abdominal aorta. Patent portal and splenic veins. No acute vascular findings. No abdominopelvic adenopathy. Reproductive: Prostate is unremarkable. Other: Inflammatory changes stranding and non organized free fluid in the upper abdomen related to pancreatic inflammation. Free fluid tracks into the upper abdomen, right upper quadrant, right pericolic gutter, retroperitoneum, and lesser sac. Small amount of free fluid is tract into the pelvis. No organized fluid collection. No free air. Tiny fat containing umbilical hernia. Musculoskeletal: Chronic bilateral  L5 pars interarticularis defects. No significant listhesis. There are no acute or suspicious osseous abnormalities. IMPRESSION: 1. Acute edematous pancreatitis. Significant peripancreatic inflammatory changes. No evidence of pancreatic necrosis or acute peripancreatic fluid collection. 2. Mild hepatic steatosis. 3. Chronic bilateral L5 pars interarticularis defects without significant listhesis. Electronically Signed   By: Narda Rutherford M.D.   On: 08/25/2019 16:34   DG Chest Portable 1 View  Result Date: 08/25/2019 CLINICAL DATA:  Cough. EXAM: PORTABLE CHEST 1 VIEW COMPARISON:  No prior. FINDINGS: Mediastinum hilar structures normal. Heart size normal. No focal infiltrate. No pleural effusion or pneumothorax. No acute bony abnormality. IMPRESSION: No acute cardiopulmonary disease. Electronically Signed   By: Maisie Fus  Register   On: 08/25/2019 06:19    .Critical Care Performed by: Linwood Dibbles, PA-C Authorized by: Linwood Dibbles, PA-C   Critical care provider statement:    Critical care time (minutes):  45   Critical care was necessary to treat or prevent imminent or life-threatening deterioration of the following conditions: DKA.   Critical care was time spent personally by me on the following activities:  Discussions with consultants, evaluation of patient's response to treatment, examination of patient, ordering and performing treatments and interventions, ordering and review of laboratory studies, ordering and review of radiographic studies, pulse oximetry, re-evaluation of patient's condition, obtaining history from patient or surrogate and review of old charts    MDM  34 year old with recurrent pancreatitis due to elevated triglycerides as well as diabetes presenting for evaluation of epigastric pain, nausea and vomiting.  Personally reviewed labs which show likely DKA with bicarb 12, hyperglycemia, ketonuria.  Started on fluids, insulin via Endotool. Lipase significantly elevated to 1035 is up from his previous.  Elevated bili to 4.9.  Pending beta hydroxy, CT abdomen pelvis.  Patient will need admission for intractable pain as well as DKA.  Patient beta hydroxy significantly elevated to 4.83, CT abdomen pelvis showed edematous pancreatitis however no elevated duct suggesting choledocholithiasis suggesting gallstone pancreatitis.  Consult with Dr. Frederick Peers with [facility] who will evaluate patient for admission. Request Lipid panel and Lactic acid add on.  The patient appears reasonably stabilized for admission considering the current resources, flow, and capabilities available in the ED at this time, and I doubt any other [facility] requiring further screening and/or treatment in the ED prior to admission.        Zahria Ding A, PA-C 08/25/19 1853    Charlynne Pander, MD 08/28/19 1501

## 2019-08-25 NOTE — ED Provider Notes (Signed)
Patient is a 34 year old male that was transferred to me at shift change from [employer], [place].  Her HPI is below:  Pt is a 34 y/o male with a h/o DM, HLD, HTN, pancreatitis who presents to the ED today for eval of epigastric abd pain that started at 2pm this morning. Pain feels like nails and feels similar to when he had pancreatitis in the past. Rates pain 10/10.  Reports nausea, but denies vomiting, diarrhea, constipation.  Additionally, she states for the last week he has had subjective fevers, cough, chest congestion, fatigue. Denies SOB. Also reports nasal congestion. States his children have been sick with similar sxs.   Pt has been vaccinated against COVID.   Does not drink ETOH regularly. States prior pancreatitis episode was from hypertriglyceridemia.   Physical Exam  BP (!) 153/103 (BP Location: Left Arm)   Pulse 96   Temp 98.4 F (36.9 C) (Oral)   Resp 17   Ht 6' (1.829 m)   Wt 117.9 kg   SpO2 98%   BMI 35.26 kg/m   Physical Exam Vitals and nursing note reviewed.  Constitutional:      Appearance: He is well-developed.  HENT:     Head: Normocephalic and atraumatic.  Eyes:     Conjunctiva/sclera: Conjunctivae normal.  Cardiovascular:     Rate and Rhythm: Normal rate and regular rhythm.     Heart sounds: Normal heart sounds. No murmur heard.   Pulmonary:     Effort: Pulmonary effort is normal. No respiratory distress.     Breath sounds: Normal breath sounds. No wheezing, rhonchi or rales.  Abdominal:     Palpations: Abdomen is soft.     Tenderness: There is generalized abdominal tenderness and tenderness in the right upper quadrant and left upper quadrant. There is no guarding or rebound.  Musculoskeletal:     Cervical back: Neck supple.  Skin:    General: Skin is warm and dry.  Neurological:     Mental Status: He is alert.    ED Course/Procedures   Clinical Course as of Aug 25 1502  Fri Aug 25, 2019  1449 Corrected sodium for hyperglycemia is 132   Sodium(!): 130 [LJ]  1451 Potassium(!): 5.7 [LJ]  1451 Anion gap(!): 20 [LJ]  1452 Ketones, ur(!): 80 [LJ]  1452 Specific Gravity, Urine(!): 1.036 [LJ]  1452 Glucose, UA(!): >=500 [LJ]  1452 Glucose(!): 242 [LJ]  1458 Will obtain a CT scan of the abdomen and pelvis  Lipase(!): 1,035 [LJ]  1500 SARS Coronavirus 2: NEGATIVE [LJ]    Clinical Course User Index [LJ] Placido Sou, PA-C    Procedures  MDM   Patient is a 34 year old male that was transferred to me at shift change from [employer], [place].  Based on his labs, it appears that the patient is in both DKA and is also experiencing an acute pancreatitis.  He has a history of hypertriglyceridemia as well as pancreatitis in the past.  His symptoms feel consistent with prior pancreatitis episode.  Patient has been started on Endo tool for his DKA.  Pain has been managed with Dilaudid.  Will give patient additional fluid bolus.  Will obtain a CT scan of the abdomen and pelvis.  Patient will ultimately be admitted.  This was discussed with the patient and he is amenable.  His COVID-19 test is negative.  It is the end of my shift and patient care will be transferred to [facility].   Note: Portions of this report may have  been transcribed using voice recognition software. Every effort was made to ensure accuracy; however, inadvertent computerized transcription errors may be present.       Placido Sou, PA-C 08/25/19 1514    Benjiman Core, MD 08/25/19 [phone]

## 2019-08-25 NOTE — Hospital Course (Addendum)
[name] is a 34 year old African-American male with PMH obesity, hypertriglyceride induced pancreatitis, GERD, type 2 diabetes, hypertension, hyperlipidemia who presented to the hospital with severe abdominal pain, nausea, vomiting.  He stated the pain started around 2 AM and awoke him from sleep.  He has had ongoing nausea and vomiting throughout the day and presented for further evaluation.  In the ER he underwent work-up including CT abdomen/pelvis which revealed diffuse peripancreatic edema, fat stranding, and free fluid around the pancreas consistent with acute edematous pancreatitis.  No pancreatic necrosis appreciated or abscess. Notable labs include: WBC 6.2, Hgb 15.3, PLTC 226 Na 130, K 5.7, Co2 12, Glucose 242, BUN 10, Creat 0.96, AG 20, TB 4.9, AST 82, ALT Lipase 1035 VBG 7.23/31 Beta hydroxybutyrate 4.83  He is admitted for further treatment of DKA and hyperTG induced pancreatitis. TG initially on workup was 8,286.  He denies alcohol use.  He responded well to treatment of DKA. Care was then transitioned to focusing on treating his hyperTG induced pancreatitis. Insulin infusion was changed to fixed dosing with titrating dextrose infusions.  On the night of 08/26/19 he converted to afib with RVR and was given a cardizem bolus and persisted in afib with RVR but became hypotensive. He was asymptomatic and was started on oral lopressor.  He converted spontaneously back to NSR on 08/27/19.   Abdominal xray's were performed daily given risk of ileus development. On 7/12, imagining starting showing signs of ileus development. He was started on dulcolax supp with and continued with ongoing imaging studies.

## 2019-08-26 ENCOUNTER — Inpatient Hospital Stay (HOSPITAL_COMMUNITY): Payer: 59

## 2019-08-26 ENCOUNTER — Inpatient Hospital Stay: Payer: Self-pay

## 2019-08-26 DIAGNOSIS — R9431 Abnormal electrocardiogram [ECG] [EKG]: Secondary | ICD-10-CM

## 2019-08-26 DIAGNOSIS — I4891 Unspecified atrial fibrillation: Secondary | ICD-10-CM | POA: Insufficient documentation

## 2019-08-26 DIAGNOSIS — E119 Type 2 diabetes mellitus without complications: Secondary | ICD-10-CM

## 2019-08-26 LAB — GLUCOSE, CAPILLARY
Glucose-Capillary: 202 mg/dL — ABNORMAL HIGH (ref 70–99)
Glucose-Capillary: 204 mg/dL — ABNORMAL HIGH (ref 70–99)
Glucose-Capillary: 206 mg/dL — ABNORMAL HIGH (ref 70–99)
Glucose-Capillary: 207 mg/dL — ABNORMAL HIGH (ref 70–99)
Glucose-Capillary: 210 mg/dL — ABNORMAL HIGH (ref 70–99)
Glucose-Capillary: 210 mg/dL — ABNORMAL HIGH (ref 70–99)
Glucose-Capillary: 212 mg/dL — ABNORMAL HIGH (ref 70–99)
Glucose-Capillary: 214 mg/dL — ABNORMAL HIGH (ref 70–99)
Glucose-Capillary: 215 mg/dL — ABNORMAL HIGH (ref 70–99)
Glucose-Capillary: 224 mg/dL — ABNORMAL HIGH (ref 70–99)
Glucose-Capillary: 225 mg/dL — ABNORMAL HIGH (ref 70–99)
Glucose-Capillary: 232 mg/dL — ABNORMAL HIGH (ref 70–99)
Glucose-Capillary: 234 mg/dL — ABNORMAL HIGH (ref 70–99)
Glucose-Capillary: 234 mg/dL — ABNORMAL HIGH (ref 70–99)
Glucose-Capillary: 235 mg/dL — ABNORMAL HIGH (ref 70–99)
Glucose-Capillary: 238 mg/dL — ABNORMAL HIGH (ref 70–99)
Glucose-Capillary: 239 mg/dL — ABNORMAL HIGH (ref 70–99)
Glucose-Capillary: 240 mg/dL — ABNORMAL HIGH (ref 70–99)
Glucose-Capillary: 243 mg/dL — ABNORMAL HIGH (ref 70–99)
Glucose-Capillary: 251 mg/dL — ABNORMAL HIGH (ref 70–99)
Glucose-Capillary: 255 mg/dL — ABNORMAL HIGH (ref 70–99)
Glucose-Capillary: 256 mg/dL — ABNORMAL HIGH (ref 70–99)
Glucose-Capillary: 267 mg/dL — ABNORMAL HIGH (ref 70–99)
Glucose-Capillary: 277 mg/dL — ABNORMAL HIGH (ref 70–99)

## 2019-08-26 LAB — BASIC METABOLIC PANEL
Anion gap: 17 — ABNORMAL HIGH (ref 5–15)
Anion gap: 15 (ref 5–15)
Anion gap: 12 (ref 5–15)
BUN: 9 mg/dL (ref 6–20)
BUN: 15 mg/dL (ref 6–20)
BUN: 15 mg/dL (ref 6–20)
CO2: 9 mmol/L — ABNORMAL LOW (ref 22–32)
CO2: 12 mmol/L — ABNORMAL LOW (ref 22–32)
CO2: 13 mmol/L — ABNORMAL LOW (ref 22–32)
Calcium: 7.2 mg/dL — ABNORMAL LOW (ref 8.9–10.3)
Calcium: 7.1 mg/dL — ABNORMAL LOW (ref 8.9–10.3)
Calcium: 7.3 mg/dL — ABNORMAL LOW (ref 8.9–10.3)
Chloride: 102 mmol/L (ref 98–111)
Chloride: 103 mmol/L (ref 98–111)
Chloride: 105 mmol/L (ref 98–111)
Creatinine, Ser: 1.13 mg/dL (ref 0.61–1.24)
Creatinine, Ser: 1.35 mg/dL — ABNORMAL HIGH (ref 0.61–1.24)
Creatinine, Ser: 1.15 mg/dL (ref 0.61–1.24)
GFR calc Af Amer: >60 mL/min (ref >60)
GFR calc Af Amer: >60 mL/min (ref >60)
GFR calc Af Amer: >60 mL/min (ref >60)
GFR calc non Af Amer: >60 mL/min (ref >60)
GFR calc non Af Amer: >60 mL/min (ref >60)
GFR calc non Af Amer: >60 mL/min (ref >60)
Glucose, Bld: 213 mg/dL — ABNORMAL HIGH (ref 70–99)
Glucose, Bld: 215 mg/dL — ABNORMAL HIGH (ref 70–99)
Glucose, Bld: 207 mg/dL — ABNORMAL HIGH (ref 70–99)
Potassium: 7.3 mmol/L (ref 3.5–5.1)
Potassium: 6.8 mmol/L (ref 3.5–5.1)
Potassium: 5.6 mmol/L — ABNORMAL HIGH (ref 3.5–5.1)
Sodium: 128 mmol/L — ABNORMAL LOW (ref 135–145)
Sodium: 130 mmol/L — ABNORMAL LOW (ref 135–145)
Sodium: 130 mmol/L — ABNORMAL LOW (ref 135–145)

## 2019-08-26 LAB — CBC WITH DIFFERENTIAL/PLATELET
Abs Immature Granulocytes: 0 10*3/uL (ref 0.00–0.07)
Band Neutrophils: 18 %
Basophils Absolute: 0 10*3/uL (ref 0.0–0.1)
Basophils Relative: 0 %
Eosinophils Absolute: 0 10*3/uL (ref 0.0–0.5)
Eosinophils Relative: 1 %
HCT: 53.6 % — ABNORMAL HIGH (ref 39.0–52.0)
Hemoglobin: 17.8 g/dL — ABNORMAL HIGH (ref 13.0–17.0)
Lymphocytes Relative: 38 %
Lymphs Abs: 1.3 10*3/uL (ref 0.7–4.0)
MCH: 29.1 pg (ref 26.0–34.0)
MCHC: 33.2 g/dL (ref 30.0–36.0)
MCV: 87.6 fL (ref 80.0–100.0)
Monocytes Absolute: 0.1 10*3/uL (ref 0.1–1.0)
Monocytes Relative: 4 %
Neutro Abs: 1.9 10*3/uL (ref 1.7–7.7)
Neutrophils Relative %: 39 %
Platelets: 261 10*3/uL (ref 150–400)
RBC: 6.12 MIL/uL — ABNORMAL HIGH (ref 4.22–5.81)
RDW: 14.1 % (ref 11.5–15.5)
WBC: 3.3 10*3/uL — ABNORMAL LOW (ref 4.0–10.5)
nRBC: 0 % (ref 0.0–0.2)

## 2019-08-26 LAB — LDL CHOLESTEROL, DIRECT
Direct LDL: 259 mg/dL — ABNORMAL HIGH (ref 0–99)
Direct LDL: 96.3 mg/dL (ref 0–99)

## 2019-08-26 LAB — LIPID PANEL
Cholesterol: 1092 mg/dL — ABNORMAL HIGH (ref 0–200)
Cholesterol: 1018 mg/dL — ABNORMAL HIGH (ref 0–200)
Cholesterol: 976 mg/dL — ABNORMAL HIGH (ref 0–200)
HDL: 22 mg/dL — ABNORMAL LOW (ref >40)
HDL: 22 mg/dL — ABNORMAL LOW (ref >40)
LDL Cholesterol: UNDETERMINED mg/dL (ref 0–99)
LDL Cholesterol: UNDETERMINED mg/dL (ref 0–99)
Triglycerides: >5000 mg/dL — ABNORMAL HIGH (ref <150)
Triglycerides: >5000 mg/dL — ABNORMAL HIGH (ref <150)
Triglycerides: 3820 mg/dL — ABNORMAL HIGH (ref <150)
VLDL: UNDETERMINED mg/dL (ref 0–40)
VLDL: UNDETERMINED mg/dL (ref 0–40)
VLDL: UNDETERMINED mg/dL (ref 0–40)

## 2019-08-26 LAB — ECHOCARDIOGRAM COMPLETE
Height: 72 in
Weight: 4160 oz

## 2019-08-26 LAB — BETA-HYDROXYBUTYRIC ACID
Beta-Hydroxybutyric Acid: 2.3 mmol/L — ABNORMAL HIGH (ref 0.05–0.27)
Beta-Hydroxybutyric Acid: 0.96 mmol/L — ABNORMAL HIGH (ref 0.05–0.27)

## 2019-08-26 LAB — COMPREHENSIVE METABOLIC PANEL
ALT: 30 U/L (ref 0–44)
AST: 47 U/L — ABNORMAL HIGH (ref 15–41)
Albumin: 2.8 g/dL — ABNORMAL LOW (ref 3.5–5.0)
Alkaline Phosphatase: 61 U/L (ref 38–126)
Anion gap: 14 (ref 5–15)
BUN: 16 mg/dL (ref 6–20)
CO2: 14 mmol/L — ABNORMAL LOW (ref 22–32)
Calcium: 7.3 mg/dL — ABNORMAL LOW (ref 8.9–10.3)
Chloride: 103 mmol/L (ref 98–111)
Creatinine, Ser: 1.19 mg/dL (ref 0.61–1.24)
GFR calc Af Amer: >60 mL/min (ref >60)
GFR calc non Af Amer: >60 mL/min (ref >60)
Glucose, Bld: 212 mg/dL — ABNORMAL HIGH (ref 70–99)
Potassium: 4.3 mmol/L (ref 3.5–5.1)
Sodium: 131 mmol/L — ABNORMAL LOW (ref 135–145)
Total Bilirubin: 1.9 mg/dL — ABNORMAL HIGH (ref 0.3–1.2)
Total Protein: 6.6 g/dL (ref 6.5–8.1)

## 2019-08-26 LAB — PHOSPHORUS
Phosphorus: 2.9 mg/dL (ref 2.5–4.6)
Phosphorus: 1.9 mg/dL — ABNORMAL LOW (ref 2.5–4.6)

## 2019-08-26 LAB — LIPASE, BLOOD: Lipase: 350 U/L — ABNORMAL HIGH (ref 11–51)

## 2019-08-26 LAB — TROPONIN I (HIGH SENSITIVITY)
Troponin I (High Sensitivity): 7 ng/L (ref <18)
Troponin I (High Sensitivity): 8 ng/L (ref <18)

## 2019-08-26 LAB — MAGNESIUM
Magnesium: 2.2 mg/dL (ref 1.7–2.4)
Magnesium: 2.4 mg/dL (ref 1.7–2.4)

## 2019-08-26 LAB — MRSA PCR SCREENING: MRSA by PCR: POSITIVE — AB

## 2019-08-26 MED ORDER — SODIUM CHLORIDE 0.9% FLUSH: 10.0000 mL | INTRAVENOUS | Status: DC | PRN

## 2019-08-26 MED ORDER — SODIUM CHLORIDE 0.9% FLUSH
10.0000 mL | Freq: Two times a day (BID) | INTRAVENOUS | Status: DC
  Administered 2019-08-26 – 2019-08-27 (×3): 10 mL
  Administered 2019-08-28: 20 mL
  Administered 2019-08-28 – 2019-09-01 (×8): 10 mL

## 2019-08-26 MED ORDER — CHLORHEXIDINE GLUCONATE CLOTH 2 % EX PADS
6.0000 | MEDICATED_PAD | Freq: Every day | CUTANEOUS | Status: AC
  Administered 2019-08-26 – 2019-08-29 (×4): 6 via TOPICAL

## 2019-08-26 MED ORDER — MUPIROCIN 2 % EX OINT
1.0000 "application " | TOPICAL_OINTMENT | Freq: Two times a day (BID) | CUTANEOUS | Status: AC
  Administered 2019-08-26 – 2019-08-30 (×9): 1 via NASAL
  Filled 2019-08-26: qty 22

## 2019-08-26 MED ORDER — HYDROMORPHONE HCL 1 MG/ML IJ SOLN
0.5000 mg | INTRAMUSCULAR | Status: DC | PRN
  Administered 2019-08-26 – 2019-08-30 (×29): 0.5 mg via INTRAVENOUS
  Filled 2019-08-26 (×29): qty 1

## 2019-08-26 NOTE — Progress Notes (Signed)
PROGRESS NOTE    Douglas [name].   DDU:[id number]  DOB: 01-03-1986  DOA: 08/25/2019     1  PCP: Tomasa Hose, NP  CC: N/V, abd pain  Hospital Course: Douglas [name] is a 34 year old African-American male with PMH obesity, hypertriglyceride induced pancreatitis, GERD, type 2 diabetes, hypertension, hyperlipidemia who presented to the hospital with severe abdominal pain, nausea, vomiting.  He states the pain started around 2 AM this morning and awoke him from sleep.  He has had ongoing nausea and vomiting throughout the day and presented for further evaluation.  In the ER he underwent work-up including CT abdomen/pelvis which revealed diffuse peripancreatic edema, fat stranding, and free fluid around the pancreas consistent with acute edematous pancreatitis.  No pancreatic necrosis appreciated or abscess. Notable labs include: WBC 6.2, Hgb 15.3, PLTC 226 Na 130, K 5.7, Co2 12, Glucose 242, BUN 10, Creat 0.96, AG 20, TB 4.9, AST 82, ALT Lipase 1035 VBG 7.23/31 Beta hydroxybutyrate 4.83  He is admitted for further treatment of DKA and hyperTG induced pancreatitis. TG initially on workup was 8,286.  He denies alcohol use.   Interval History:  Feeling better this am; last vomiting episode was last night on arrival to SDU. Abdominal pain better this am also and nausea at a minimum.  Denies CP or SOB.  Updated his mom over the phone while in room with patient this am.   Old records reviewed in assessment of this patient  ROS: Constitutional: positive for fatigue, Respiratory: negative for cough, Cardiovascular: negative for chest pain and Gastrointestinal: positive for abdominal pain  Assessment & Plan: DKA (diabetic ketoacidoses) ([place]) - continue on DKA protocol - continue trending BMP, Mg, Phos - will transition off insulin drip as able (see pancreatitis)  Acute pancreatitis -Patient denies alcohol use.  Meets criteria for pancreatitis in setting of lipase, CT  abd/pelvis, clinical exam -Etiology is considered due to hypertriglyceridemia.  TG 8,286 -Continue on same treatment for DKA, however if DKA resolves prior to triglycerides<500, will transition to fixed dose insulin drip for ongoing treatment of hypertriglyceride induced pancreatitis - see hyperTG -Continue fluids -Continue Zofran, Compazine -Continue dilaudid for pain control  Hypertension -Use labetalol or hydralazine as needed  Type 2 diabetes mellitus without complication ([place]) -Follow-up A1c -Patient states he takes Metformin only at home, no insulin -Will reinitiate therapy after resolution of DKA and pancreatitis  Hyperlipidemia -Last lipid panel reviewed from August 2019.  Triglycerides were over 1000 at that time -Patient takes Lipitor and fenofibrate at home.  Will max out both once he is tolerating p.o. again.  Will also need to add on fish oil 2 g twice daily -Follow-up lipid profile and trend every 12 hours while treating pancreatitis. Initial TG 8,286  Abnormal EKG - patient endorses long standing HTN for approx 10 years; does not appear to be fully controlled at baseline; EKG concerning for LVH - obtain baseline echo to further evaluate - troponin x 1 negative on 7/10; denies any CP    Antimicrobials: n/a  DVT prophylaxis: Lovenox Code Status: Full Family Communication: mother via phone Disposition Plan:   Patient came from: Home         Barriers to d/c OR conditions which need to be met to effect a safe d/c: none  The current disposition plan is discharge to: home when medically stable   Objective: Blood pressure (!) 147/94, pulse (!) 112, temperature 98.2 F (36.8 C), temperature source Oral, resp. rate 15, height 6' (1.829 m),  weight 117.9 kg, SpO2 98 %.   Intake/Output Summary (Last 24 hours) at 08/26/2019 1126 Last data filed at 08/26/2019 1032 Gross per 24 hour  Intake 1406 ml  Output 700 ml  Net 706 ml   Last Weight  Most recent update:  08/25/2019  5:30 AM   Weight  117.9 kg (260 lb)           Examination: General appearance: young adult man resting in bed appearing much more comfortable this am Head: Normocephalic, without obvious abnormality Eyes: EOMI Lungs: clear to auscultation bilaterally Heart: regular rate and rhythm and S1, S2 normal Abdomen: soft, less TTP, BS hypoactive, ND Extremities: no edema Skin: Skin color, texture, turgor normal. No rashes or lesions or mobility and turgor normal Neurologic: Grossly normal   Consultants:   n/a  Procedures:   n/a  Data Reviewed: I have personally reviewed following labs and imaging studies Results for orders placed or performed during the hospital encounter of 08/25/19 (from the past 24 hour(s))  Comprehensive metabolic panel     Status: Abnormal   Collection Time: 08/25/19 12:00 PM  Result Value Ref Range   Sodium 130 (L) 135 - 145 mmol/L   Potassium 5.7 (H) 3.5 - 5.1 mmol/L   Chloride 98 98 - 111 mmol/L   CO2 12 (L) 22 - 32 mmol/L   Glucose, Bld 242 (H) 70 - 99 mg/dL   BUN 10 6 - 20 mg/dL   Creatinine, Ser 0.96 0.61 - 1.24 mg/dL   Calcium 8.0 (L) 8.9 - 10.3 mg/dL   Total Protein RESULTS UNAVAILABLE DUE TO INTERFERING SUBSTANCE 6.5 - 8.1 g/dL   Albumin 3.8 3.5 - 5.0 g/dL   AST 82 (H) 15 - 41 U/L   ALT 40 0 - 44 U/L   Alkaline Phosphatase 95 38 - 126 U/L   Total Bilirubin 4.9 (H) 0.3 - 1.2 mg/dL   GFR calc non Af Amer >60 >60 mL/min   GFR calc Af Amer >60 >60 mL/min   Anion gap 20 (H) 5 - 15  Lipase, blood     Status: Abnormal   Collection Time: 08/25/19 12:00 PM  Result Value Ref Range   Lipase 1,035 (H) 11 - 51 U/L  Blood gas, venous     Status: Abnormal   Collection Time: 08/25/19  2:54 PM  Result Value Ref Range   pH, Ven 7.234 (L) 7.25 - 7.43   pCO2, Ven 31.4 (L) 44 - 60 mmHg   pO2, Ven 94.8 (H) 32 - 45 mmHg   Bicarbonate 12.8 (L) 20.0 - 28.0 mmol/L   Acid-base deficit 13.5 (H) 0.0 - 2.0 mmol/L   O2 Saturation 96.6 %   Patient  temperature 98.6   CBG monitoring, ED     Status: Abnormal   Collection Time: 08/25/19  3:02 PM  Result Value Ref Range   Glucose-Capillary 211 (H) 70 - 99 mg/dL  Beta-hydroxybutyric acid     Status: Abnormal   Collection Time: 08/25/19  3:30 PM  Result Value Ref Range   Beta-Hydroxybutyric Acid 4.83 (H) 0.05 - 0.27 mmol/L  Bilirubin, direct     Status: Abnormal   Collection Time: 08/25/19  4:26 PM  Result Value Ref Range   Bilirubin, Direct 1.5 (H) 0.0 - 0.2 mg/dL  CBG monitoring, ED     Status: Abnormal   Collection Time: 08/25/19  4:37 PM  Result Value Ref Range   Glucose-Capillary 189 (H) 70 - 99 mg/dL  CBG monitoring, ED  Status: Abnormal   Collection Time: 08/25/19  5:55 PM  Result Value Ref Range   Glucose-Capillary 273 (H) 70 - 99 mg/dL  MRSA PCR Screening     Status: Abnormal   Collection Time: 08/25/19  7:31 PM   Specimen: Nasopharyngeal  Result Value Ref Range   MRSA by PCR POSITIVE (A) NEGATIVE  Lipid panel     Status: Abnormal   Collection Time: 08/25/19  8:01 PM  Result Value Ref Range   Cholesterol 1,092 (H) 0 - 200 mg/dL   Triglycerides 8,286 (H) <150 mg/dL   HDL 22 (L) >40 mg/dL   Total CHOL/HDL Ratio NOT REPORTED DUE TO HIGH TRIGLYCERIDES RATIO   VLDL UNABLE TO CALCULATE IF TRIGLYCERIDE OVER 400 mg/dL 0 - 40 mg/dL   LDL Cholesterol UNABLE TO CALCULATE IF TRIGLYCERIDE OVER 400 mg/dL 0 - 99 mg/dL  Lactic acid, plasma     Status: None   Collection Time: 08/25/19  8:01 PM  Result Value Ref Range   Lactic Acid, Venous 1.6 0.5 - 1.9 mmol/L  Magnesium     Status: None   Collection Time: 08/25/19  8:01 PM  Result Value Ref Range   Magnesium 2.2 1.7 - 2.4 mg/dL  Phosphorus     Status: None   Collection Time: 08/25/19  8:01 PM  Result Value Ref Range   Phosphorus 2.9 2.5 - 4.6 mg/dL  Glucose, capillary     Status: Abnormal   Collection Time: 08/25/19  8:01 PM  Result Value Ref Range   Glucose-Capillary 228 (H) 70 - 99 mg/dL  LDL cholesterol, direct      Status: Abnormal   Collection Time: 08/25/19  8:01 PM  Result Value Ref Range   Direct LDL 259.0 (H) 0 - 99 mg/dL  Basic metabolic panel     Status: Abnormal   Collection Time: 08/25/19  8:14 PM  Result Value Ref Range   Sodium 128 (L) 135 - 145 mmol/L   Potassium 7.3 (HH) 3.5 - 5.1 mmol/L   Chloride 102 98 - 111 mmol/L   CO2 9 (L) 22 - 32 mmol/L   Glucose, Bld 213 (H) 70 - 99 mg/dL   BUN 9 6 - 20 mg/dL   Creatinine, Ser 1.13 0.61 - 1.24 mg/dL   Calcium 7.2 (L) 8.9 - 10.3 mg/dL   GFR calc non Af Amer >60 >60 mL/min   GFR calc Af Amer >60 >60 mL/min   Anion gap 17 (H) 5 - 15  Glucose, capillary     Status: Abnormal   Collection Time: 08/25/19  9:16 PM  Result Value Ref Range   Glucose-Capillary 242 (H) 70 - 99 mg/dL  Glucose, capillary     Status: Abnormal   Collection Time: 08/25/19 10:16 PM  Result Value Ref Range   Glucose-Capillary 235 (H) 70 - 99 mg/dL   Comment 1 Notify RN    Comment 2 Document in Chart   Glucose, capillary     Status: Abnormal   Collection Time: 08/25/19 11:16 PM  Result Value Ref Range   Glucose-Capillary 283 (H) 70 - 99 mg/dL  Glucose, capillary     Status: Abnormal   Collection Time: 08/26/19 12:15 AM  Result Value Ref Range   Glucose-Capillary 240 (H) 70 - 99 mg/dL   Comment 1 Notify RN    Comment 2 Document in Chart   Beta-hydroxybutyric acid     Status: Abnormal   Collection Time: 08/26/19 12:16 AM  Result Value Ref Range   Beta-Hydroxybutyric  Acid 2.30 (H) 0.05 - 0.27 mmol/L  Glucose, capillary     Status: Abnormal   Collection Time: 08/26/19  1:16 AM  Result Value Ref Range   Glucose-Capillary 238 (H) 70 - 99 mg/dL  Glucose, capillary     Status: Abnormal   Collection Time: 08/26/19  2:10 AM  Result Value Ref Range   Glucose-Capillary 267 (H) 70 - 99 mg/dL   Comment 1 Notify RN    Comment 2 Document in Chart   CBC with Differential/Platelet     Status: Abnormal   Collection Time: 08/26/19  2:49 AM  Result Value Ref Range   WBC 3.3  (L) 4.0 - 10.5 K/uL   RBC 6.12 (H) 4.22 - 5.81 MIL/uL   Hemoglobin 17.8 (H) 13.0 - 17.0 g/dL   HCT 53.6 (H) 39 - 52 %   MCV 87.6 80.0 - 100.0 fL   MCH 29.1 26.0 - 34.0 pg   MCHC 33.2 30.0 - 36.0 g/dL   RDW 14.1 11.5 - 15.5 %   Platelets 261 150 - 400 K/uL   nRBC 0.0 0.0 - 0.2 %   Neutrophils Relative % 39 %   Neutro Abs 1.9 1.7 - 7.7 K/uL   Band Neutrophils 18 %   Lymphocytes Relative 38 %   Lymphs Abs 1.3 0.7 - 4.0 K/uL   Monocytes Relative 4 %   Monocytes Absolute 0.1 0 - 1 K/uL   Eosinophils Relative 1 %   Eosinophils Absolute 0.0 0 - 0 K/uL   Basophils Relative 0 %   Basophils Absolute 0.0 0 - 0 K/uL   Abs Immature Granulocytes 0.00 0.00 - 0.07 K/uL  Lipid panel     Status: Abnormal   Collection Time: 08/26/19  2:49 AM  Result Value Ref Range   Cholesterol 1,018 (H) 0 - 200 mg/dL   Triglycerides >5,000 (H) <150 mg/dL   HDL 22 (L) >40 mg/dL   Total CHOL/HDL Ratio NOT REPORTED DUE TO HIGH TRIGLYCERIDES RATIO   VLDL UNABLE TO CALCULATE IF TRIGLYCERIDE OVER 400 mg/dL 0 - 40 mg/dL   LDL Cholesterol UNABLE TO CALCULATE IF TRIGLYCERIDE OVER 400 mg/dL 0 - 99 mg/dL  LDL cholesterol, direct     Status: None   Collection Time: 08/26/19  2:49 AM  Result Value Ref Range   Direct LDL 96.3 0 - 99 mg/dL  Glucose, capillary     Status: Abnormal   Collection Time: 08/26/19  3:09 AM  Result Value Ref Range   Glucose-Capillary 256 (H) 70 - 99 mg/dL   Comment 1 Notify RN    Comment 2 Document in Chart   Glucose, capillary     Status: Abnormal   Collection Time: 08/26/19  4:26 AM  Result Value Ref Range   Glucose-Capillary 251 (H) 70 - 99 mg/dL   Comment 1 Notify RN    Comment 2 Document in Chart   Glucose, capillary     Status: Abnormal   Collection Time: 08/26/19  5:42 AM  Result Value Ref Range   Glucose-Capillary 243 (H) 70 - 99 mg/dL  Glucose, capillary     Status: Abnormal   Collection Time: 08/26/19  6:26 AM  Result Value Ref Range   Glucose-Capillary 235 (H) 70 - 99 mg/dL     Comment 1 Notify RN    Comment 2 Document in Chart   Glucose, capillary     Status: Abnormal   Collection Time: 08/26/19  7:27 AM  Result Value Ref Range   Glucose-Capillary  204 (H) 70 - 99 mg/dL   Comment 1 Notify RN    Comment 2 Document in Chart   Beta-hydroxybutyric acid     Status: Abnormal   Collection Time: 08/26/19  7:53 AM  Result Value Ref Range   Beta-Hydroxybutyric Acid 0.96 (H) 0.05 - 0.27 mmol/L  Basic metabolic panel     Status: Abnormal   Collection Time: 08/26/19  7:53 AM  Result Value Ref Range   Sodium 130 (L) 135 - 145 mmol/L   Potassium 6.8 (HH) 3.5 - 5.1 mmol/L   Chloride 103 98 - 111 mmol/L   CO2 12 (L) 22 - 32 mmol/L   Glucose, Bld 215 (H) 70 - 99 mg/dL   BUN 15 6 - 20 mg/dL   Creatinine, Ser 1.35 (H) 0.61 - 1.24 mg/dL   Calcium 7.1 (L) 8.9 - 10.3 mg/dL   GFR calc non Af Amer >60 >60 mL/min   GFR calc Af Amer >60 >60 mL/min   Anion gap 15 5 - 15  Magnesium     Status: None   Collection Time: 08/26/19  7:53 AM  Result Value Ref Range   Magnesium 2.4 1.7 - 2.4 mg/dL  Phosphorus     Status: Abnormal   Collection Time: 08/26/19  7:53 AM  Result Value Ref Range   Phosphorus 1.9 (L) 2.5 - 4.6 mg/dL  Glucose, capillary     Status: Abnormal   Collection Time: 08/26/19  8:32 AM  Result Value Ref Range   Glucose-Capillary 255 (H) 70 - 99 mg/dL   Comment 1 Notify RN    Comment 2 Document in Chart   Troponin I (High Sensitivity)     Status: None   Collection Time: 08/26/19  9:25 AM  Result Value Ref Range   Troponin I (High Sensitivity) 7 <18 ng/L  Glucose, capillary     Status: Abnormal   Collection Time: 08/26/19  9:30 AM  Result Value Ref Range   Glucose-Capillary 277 (H) 70 - 99 mg/dL   Comment 1 Notify RN    Comment 2 Document in Chart   Glucose, capillary     Status: Abnormal   Collection Time: 08/26/19 10:31 AM  Result Value Ref Range   Glucose-Capillary 239 (H) 70 - 99 mg/dL    Recent Results (from the past 240 hour(s))  SARS Coronavirus  2 by RT PCR (hospital order, performed in [place] hospital lab) Nasopharyngeal Nasopharyngeal Swab     Status: None   Collection Time: 08/25/19  5:44 AM   Specimen: Nasopharyngeal Swab  Result Value Ref Range Status   SARS Coronavirus 2 NEGATIVE NEGATIVE Final    Comment: (NOTE) SARS-CoV-2 target nucleic acids are NOT DETECTED.  The SARS-CoV-2 RNA is generally detectable in upper and lower respiratory specimens during the acute phase of infection. The lowest concentration of SARS-CoV-2 viral copies this assay can detect is 250 copies / mL. A negative result does not preclude SARS-CoV-2 infection and should not be used as the sole basis for treatment or other patient management decisions.  A negative result may occur with improper specimen collection / handling, submission of specimen other than nasopharyngeal swab, presence of viral mutation(s) within the areas targeted by this assay, and inadequate number of viral copies (<250 copies / mL). A negative result must be combined with clinical observations, patient history, and epidemiological information.  Fact Sheet for Patients:   StrictlyIdeas.no  Fact Sheet for Healthcare Providers: BankingDealers.co.za  This test is not yet approved or  cleared by the [place] and has been authorized for detection and/or diagnosis of SARS-CoV-2 by FDA under an Emergency Use Authorization (EUA).  This EUA will remain in effect (meaning this test can be used) for the duration of the COVID-19 declaration under Section 564(b)(1) of the Act, 21 U.S.C. section 360bbb-3(b)(1), unless the authorization is terminated or revoked sooner.  Performed at [facility], [place] [address], [place], [place] [address]   MRSA PCR Screening     Status: Abnormal   Collection Time: 08/25/19  7:31 PM   Specimen: Nasopharyngeal  Result Value Ref Range Status   MRSA by PCR POSITIVE (A)  NEGATIVE Final    Comment:        The GeneXpert MRSA Assay (FDA approved for NASAL specimens only), is one component of a comprehensive MRSA colonization surveillance program. It is not intended to diagnose MRSA infection nor to guide or monitor treatment for MRSA infections. RESULT CALLED TO, READ BACK BY AND VERIFIED WITH: CHIP, EMILY @ 0865 08/26/2019 Rikki Spearing. Performed at [facility], [place] [address] [place],  [address]      Radiology Studies: CT ABDOMEN PELVIS W CONTRAST  Result Date: 08/25/2019 CLINICAL DATA:  Epigastric pain. History of pancreatitis. Elevated lipase. EXAM: CT ABDOMEN AND PELVIS WITH CONTRAST TECHNIQUE: Multidetector CT imaging of the abdomen and pelvis was performed using the standard protocol following bolus administration of intravenous contrast. CONTRAST:  139m OMNIPAQUE IOHEXOL 300 MG/ML  SOLN COMPARISON:  CT 09/18/2017 FINDINGS: Lower chest: No consolidation or pleural fluid. Hepatobiliary: Mild diffuse steatosis without evidence of focal lesion. Physiologically distended gallbladder. No calcified gallstone. Common bile duct is not well-defined but no evidence of biliary dilatation. Pancreas: Diffuse peripancreatic edema, fat stranding, and free fluid about the entirety of the pancreas and retroperitoneum. Homogeneous pancreatic enhancement without evidence of necrosis. Non organized free fluid tracks into the upper abdomen, right pericolic gutter car lesser sac and retroperitoneum without organized fluid collection. No soft tissue air. Splenic vein is patent. Spleen: Normal in size without focal abnormality. Adrenals/Urinary Tract: Normal adrenal glands. No hydronephrosis or perinephric edema. There is slight prominence of the right renal collecting system and ureter which is likely reactive. No obstructive change. Homogeneous renal enhancement with symmetric excretion on delayed phase imaging. Urinary bladder is physiologically distended  without wall thickening. Stomach/Bowel: Intraluminal fluid in the stomach. Mild mucosal enhancement involving the peri pyloric stomach and duodenum without obvious wall thickening, likely reactive. There is no small bowel dilatation, inflammation or obstruction. High-riding cecum in the right mid abdomen. Normal appendix courses in the right lower quadrant. Small to moderate volume of colonic stool. There is no colonic wall thickening or inflammation. Vascular/Lymphatic: Normal caliber abdominal aorta. Patent portal and splenic veins. No acute vascular findings. No abdominopelvic adenopathy. Reproductive: Prostate is unremarkable. Other: Inflammatory changes stranding and non organized free fluid in the upper abdomen related to pancreatic inflammation. Free fluid tracks into the upper abdomen, right upper quadrant, right pericolic gutter, retroperitoneum, and lesser sac. Small amount of free fluid is tract into the pelvis. No organized fluid collection. No free air. Tiny fat containing umbilical hernia. Musculoskeletal: Chronic bilateral L5 pars interarticularis defects. No significant listhesis. There are no acute or suspicious osseous abnormalities. IMPRESSION: 1. Acute edematous pancreatitis. Significant peripancreatic inflammatory changes. No evidence of pancreatic necrosis or acute peripancreatic fluid collection. 2. Mild hepatic steatosis. 3. Chronic bilateral L5 pars interarticularis defects without significant listhesis. Electronically Signed   By: MAurther LoftD.  On: 08/25/2019 16:34   DG Chest Portable 1 View  Result Date: 08/25/2019 CLINICAL DATA:  Cough. EXAM: PORTABLE CHEST 1 VIEW COMPARISON:  No prior. FINDINGS: Mediastinum hilar structures normal. Heart size normal. No focal infiltrate. No pleural effusion or pneumothorax. No acute bony abnormality. IMPRESSION: No acute cardiopulmonary disease. Electronically Signed   By: Marcello Moores  Register   On: 08/25/2019 06:19   [place] EKG SITE  RITE  Result Date: 08/26/2019 If Site Rite image not attached, placement could not be confirmed due to current cardiac rhythm.  [place] EKG SITE RITE  Final Result    CT ABDOMEN PELVIS W CONTRAST  Final Result    DG Chest Portable 1 View  Final Result    DG Abd Portable 2V    (Results Pending)     Scheduled Meds:  Chlorhexidine Gluconate Cloth  6 each Topical Q0600   enoxaparin (LOVENOX) injection  40 mg Subcutaneous Q24H   mupirocin ointment  1 application Nasal BID   PRN Meds: dextrose, hydrALAZINE, HYDROmorphone (DILAUDID) injection, labetalol, ondansetron (ZOFRAN) IV, prochlorperazine Continuous Infusions:  sodium chloride Stopped (08/25/19 2004)   dextrose 5 % and 0.45% NaCl     insulin 6 Units/hr (08/26/19 1033)      LOS: 1 day  Time spent: Greater than 50% of the 35 minute visit was spent in counseling/coordination of care for the patient as laid out in the A&P.   Dwyane Dee, MD [facility] 08/26/2019, 11:26 AM   Contact via secure chat.  To contact the attending provider between 7A-7P or the covering provider during after hours 7P-7A, please log into the web site [email or web address] and access using universal [place] password for that web site. If you do not have the password, please call the hospital operator.

## 2019-08-26 NOTE — Progress Notes (Signed)
Securechat with RN.  PICC needed for labs.  Notified should be ab;e to place PICC this pm.

## 2019-08-26 NOTE — Progress Notes (Signed)
Peripherally Inserted Central Catheter Placement  The IV Nurse has discussed with the patient and/or persons authorized to consent for the patient, the purpose of this procedure and the potential benefits and risks involved with this procedure.  The benefits include less needle sticks, lab draws from the catheter, and the patient may be discharged home with the catheter. Risks include, but not limited to, infection, bleeding, blood clot (thrombus formation), and puncture of an artery; nerve damage and irregular heartbeat and possibility to perform a PICC exchange if needed/ordered by physician.  Alternatives to this procedure were also discussed.  Bard Power PICC patient education guide, fact sheet on infection prevention and patient information card has been provided to patient /or left at bedside.    PICC Placement Documentation  PICC Double Lumen 08/26/19 PICC Right Basilic 42 cm 3 cm (Active)  Indication for Insertion or Continuance of Line Limited venous access - need for IV therapy >5 days (PICC only) 08/26/19 1608  Exposed Catheter (cm) 3 cm 08/26/19 1608  Site Assessment Clean;Dry;Intact 08/26/19 1608  Lumen #1 Status Flushed;Saline locked;Blood return noted 08/26/19 1608  Lumen #2 Status Flushed;Saline locked;Blood return noted 08/26/19 1608  Dressing Type Transparent 08/26/19 1608  Dressing Status Clean;Dry;Intact 08/26/19 1608  Dressing Intervention New dressing 08/26/19 1608  Dressing Change Due 09/02/19 08/26/19 1608       Niesha Bame, Lajean Manes 08/26/2019, 4:10 PM

## 2019-08-26 NOTE — Progress Notes (Signed)
CRITICAL VALUE ALERT  Critical Value:  K 7.3  Date & Time Notied:  0730, 08/26/19  Provider Notified: Dr. Frederick Peers  Orders Received/Actions taken: Lab specimen was drawn at 2000 on 08/25/19, and sample was hemolyzed making K grossly elevated. Dr. Frederick Peers aware; no new actions at this time. Awaiting new BMP sample this AM. RN will continue to monitor.

## 2019-08-26 NOTE — Plan of Care (Signed)
This RN will continue to monitor patient's progression of care plan.  

## 2019-08-26 NOTE — Progress Notes (Signed)
Patient's bedside monitor alarming ST elevation. Pt denies SOB, and chest pain upon assessment. Dr. Frederick Peers notified by this RN, and orders received to obtain EKG. EKG showed T wave abnormality and possible inferolateral ischemia. MD notified, awaiting further orders. RN will continue to monitor pt.

## 2019-08-26 NOTE — Assessment & Plan Note (Addendum)
-   no prior history of similar; etiology likely in setting of acute illness - refractory to cardizem bolus; BP hypotensive; options would be chemical vs electrical cardioversion - cardiology consulted, appreciate assistance - had ordered heparin gtt and amiodarone drip but patient converted back to NSR morning of 08/27/19 prior to initiation of drips; therefore will hold off on starting unless converts again or per cardiology rec's - echo obtained on 7/10 prior to afib: EF 50-55%, Gr 1 DD - troponin x 1 negative on 7/10; denies any CP

## 2019-08-26 NOTE — Progress Notes (Signed)
Lab has been unable to analyze patient blood samples due to increased lipemic levels. Thus we have not been able to get an accurate blood sample. [facility] NP notified and has given permission to use the sample lab values previously drawn. It is understood per Lab technician that the potassium is the value that will be incorrect. However, the other portion of the lab sample value should be accurate. Lab is in the process of trying to collect a sample that is not hemolyzed.

## 2019-08-26 NOTE — Progress Notes (Signed)
CRITICAL VALUE ALERT  Critical Value: 6.8  Date & Time Notied: 1100, 08/26/19  Provider Notified: Dr. Frederick Peers  Orders Received/Actions taken: No new orders at this time; specimen was sent to [facility] at 0800; K grossly elevated d/t hemolyzation.Will continue to monitor.

## 2019-08-27 ENCOUNTER — Encounter (HOSPITAL_COMMUNITY): Payer: Self-pay | Admitting: Internal Medicine

## 2019-08-27 ENCOUNTER — Inpatient Hospital Stay (HOSPITAL_COMMUNITY): Payer: 59

## 2019-08-27 DIAGNOSIS — I4891 Unspecified atrial fibrillation: Secondary | ICD-10-CM

## 2019-08-27 LAB — CBC WITH DIFFERENTIAL/PLATELET
Abs Immature Granulocytes: 0.06 10*3/uL (ref 0.00–0.07)
Basophils Absolute: 0 10*3/uL (ref 0.0–0.1)
Basophils Relative: 1 %
Eosinophils Absolute: 0 10*3/uL (ref 0.0–0.5)
Eosinophils Relative: 0 %
HCT: 47.2 % (ref 39.0–52.0)
Hemoglobin: 15.4 g/dL (ref 13.0–17.0)
Immature Granulocytes: 1 %
Lymphocytes Relative: 22 %
Lymphs Abs: 1.4 10*3/uL (ref 0.7–4.0)
MCH: 28.6 pg (ref 26.0–34.0)
MCHC: 32.6 g/dL (ref 30.0–36.0)
MCV: 87.7 fL (ref 80.0–100.0)
Monocytes Absolute: 0.5 10*3/uL (ref 0.1–1.0)
Monocytes Relative: 7 %
Neutro Abs: 4.4 10*3/uL (ref 1.7–7.7)
Neutrophils Relative %: 69 %
Platelets: 246 10*3/uL (ref 150–400)
RBC: 5.38 MIL/uL (ref 4.22–5.81)
RDW: 14.3 % (ref 11.5–15.5)
WBC: 6.4 10*3/uL (ref 4.0–10.5)
nRBC: 0 % (ref 0.0–0.2)

## 2019-08-27 LAB — COMPREHENSIVE METABOLIC PANEL
ALT: 23 U/L (ref 0–44)
ALT: 18 U/L (ref 0–44)
ALT: 22 U/L (ref 0–44)
ALT: 18 U/L (ref 0–44)
ALT: 21 U/L (ref 0–44)
AST: 43 U/L — ABNORMAL HIGH (ref 15–41)
AST: 38 U/L (ref 15–41)
AST: 38 U/L (ref 15–41)
AST: 41 U/L (ref 15–41)
AST: 42 U/L — ABNORMAL HIGH (ref 15–41)
Albumin: 2.7 g/dL — ABNORMAL LOW (ref 3.5–5.0)
Albumin: 2.5 g/dL — ABNORMAL LOW (ref 3.5–5.0)
Albumin: 2.7 g/dL — ABNORMAL LOW (ref 3.5–5.0)
Albumin: 3.1 g/dL — ABNORMAL LOW (ref 3.5–5.0)
Albumin: 2.5 g/dL — ABNORMAL LOW (ref 3.5–5.0)
Alkaline Phosphatase: 64 U/L (ref 38–126)
Alkaline Phosphatase: 54 U/L (ref 38–126)
Alkaline Phosphatase: 57 U/L (ref 38–126)
Alkaline Phosphatase: 57 U/L (ref 38–126)
Alkaline Phosphatase: 54 U/L (ref 38–126)
Anion gap: 10 (ref 5–15)
Anion gap: 13 (ref 5–15)
Anion gap: 14 (ref 5–15)
Anion gap: 9 (ref 5–15)
Anion gap: 12 (ref 5–15)
BUN: 15 mg/dL (ref 6–20)
BUN: 16 mg/dL (ref 6–20)
BUN: 16 mg/dL (ref 6–20)
BUN: 15 mg/dL (ref 6–20)
BUN: 14 mg/dL (ref 6–20)
CO2: 18 mmol/L — ABNORMAL LOW (ref 22–32)
CO2: 15 mmol/L — ABNORMAL LOW (ref 22–32)
CO2: 16 mmol/L — ABNORMAL LOW (ref 22–32)
CO2: 20 mmol/L — ABNORMAL LOW (ref 22–32)
CO2: 17 mmol/L — ABNORMAL LOW (ref 22–32)
Calcium: 7.5 mg/dL — ABNORMAL LOW (ref 8.9–10.3)
Calcium: 7.8 mg/dL — ABNORMAL LOW (ref 8.9–10.3)
Calcium: 7.9 mg/dL — ABNORMAL LOW (ref 8.9–10.3)
Calcium: 8.3 mg/dL — ABNORMAL LOW (ref 8.9–10.3)
Calcium: 8.1 mg/dL — ABNORMAL LOW (ref 8.9–10.3)
Chloride: 100 mmol/L (ref 98–111)
Chloride: 104 mmol/L (ref 98–111)
Chloride: 105 mmol/L (ref 98–111)
Chloride: 104 mmol/L (ref 98–111)
Chloride: 103 mmol/L (ref 98–111)
Creatinine, Ser: 0.68 mg/dL (ref 0.61–1.24)
Creatinine, Ser: 1.15 mg/dL (ref 0.61–1.24)
Creatinine, Ser: 1.2 mg/dL (ref 0.61–1.24)
Creatinine, Ser: 1.15 mg/dL (ref 0.61–1.24)
Creatinine, Ser: 1 mg/dL (ref 0.61–1.24)
GFR calc Af Amer: >60 mL/min (ref >60)
GFR calc Af Amer: >60 mL/min (ref >60)
GFR calc Af Amer: >60 mL/min (ref >60)
GFR calc Af Amer: >60 mL/min (ref >60)
GFR calc Af Amer: >60 mL/min (ref >60)
GFR calc non Af Amer: >60 mL/min (ref >60)
GFR calc non Af Amer: >60 mL/min (ref >60)
GFR calc non Af Amer: >60 mL/min (ref >60)
GFR calc non Af Amer: >60 mL/min (ref >60)
GFR calc non Af Amer: >60 mL/min (ref >60)
Glucose, Bld: 194 mg/dL — ABNORMAL HIGH (ref 70–99)
Glucose, Bld: 161 mg/dL — ABNORMAL HIGH (ref 70–99)
Glucose, Bld: 165 mg/dL — ABNORMAL HIGH (ref 70–99)
Glucose, Bld: 134 mg/dL — ABNORMAL HIGH (ref 70–99)
Glucose, Bld: 46 mg/dL — ABNORMAL LOW (ref 70–99)
Potassium: 4.5 mmol/L (ref 3.5–5.1)
Potassium: 4 mmol/L (ref 3.5–5.1)
Potassium: 4 mmol/L (ref 3.5–5.1)
Potassium: 3.6 mmol/L (ref 3.5–5.1)
Potassium: 3.6 mmol/L (ref 3.5–5.1)
Sodium: 128 mmol/L — ABNORMAL LOW (ref 135–145)
Sodium: 133 mmol/L — ABNORMAL LOW (ref 135–145)
Sodium: 134 mmol/L — ABNORMAL LOW (ref 135–145)
Sodium: 133 mmol/L — ABNORMAL LOW (ref 135–145)
Sodium: 132 mmol/L — ABNORMAL LOW (ref 135–145)
Total Bilirubin: 2.8 mg/dL — ABNORMAL HIGH (ref 0.3–1.2)
Total Bilirubin: 1 mg/dL (ref 0.3–1.2)
Total Bilirubin: 1.7 mg/dL — ABNORMAL HIGH (ref 0.3–1.2)
Total Bilirubin: 1.3 mg/dL — ABNORMAL HIGH (ref 0.3–1.2)
Total Bilirubin: 1.1 mg/dL (ref 0.3–1.2)
Total Protein: >12 g/dL — ABNORMAL HIGH (ref 6.5–8.1)
Total Protein: 6.4 g/dL — ABNORMAL LOW (ref 6.5–8.1)
Total Protein: 6.6 g/dL (ref 6.5–8.1)
Total Protein: 6.8 g/dL (ref 6.5–8.1)
Total Protein: 6.3 g/dL — ABNORMAL LOW (ref 6.5–8.1)

## 2019-08-27 LAB — GLUCOSE, CAPILLARY
Glucose-Capillary: 101 mg/dL — ABNORMAL HIGH (ref 70–99)
Glucose-Capillary: 122 mg/dL — ABNORMAL HIGH (ref 70–99)
Glucose-Capillary: 126 mg/dL — ABNORMAL HIGH (ref 70–99)
Glucose-Capillary: 167 mg/dL — ABNORMAL HIGH (ref 70–99)
Glucose-Capillary: 168 mg/dL — ABNORMAL HIGH (ref 70–99)
Glucose-Capillary: 174 mg/dL — ABNORMAL HIGH (ref 70–99)
Glucose-Capillary: 182 mg/dL — ABNORMAL HIGH (ref 70–99)
Glucose-Capillary: 183 mg/dL — ABNORMAL HIGH (ref 70–99)
Glucose-Capillary: 191 mg/dL — ABNORMAL HIGH (ref 70–99)
Glucose-Capillary: 193 mg/dL — ABNORMAL HIGH (ref 70–99)
Glucose-Capillary: 204 mg/dL — ABNORMAL HIGH (ref 70–99)
Glucose-Capillary: 208 mg/dL — ABNORMAL HIGH (ref 70–99)
Glucose-Capillary: 215 mg/dL — ABNORMAL HIGH (ref 70–99)
Glucose-Capillary: 67 mg/dL — ABNORMAL LOW (ref 70–99)
Glucose-Capillary: 70 mg/dL (ref 70–99)
Glucose-Capillary: 72 mg/dL (ref 70–99)
Glucose-Capillary: 75 mg/dL (ref 70–99)
Glucose-Capillary: 77 mg/dL (ref 70–99)
Glucose-Capillary: 93 mg/dL (ref 70–99)
Glucose-Capillary: 95 mg/dL (ref 70–99)
Glucose-Capillary: 98 mg/dL (ref 70–99)
Glucose-Capillary: 98 mg/dL (ref 70–99)

## 2019-08-27 LAB — LIPID PANEL
Cholesterol: 617 mg/dL — ABNORMAL HIGH (ref 0–200)
Cholesterol: 632 mg/dL — ABNORMAL HIGH (ref 0–200)
HDL: 24 mg/dL — ABNORMAL LOW (ref >40)
LDL Cholesterol: UNDETERMINED mg/dL (ref 0–99)
LDL Cholesterol: UNDETERMINED mg/dL (ref 0–99)
Triglycerides: 1689 mg/dL — ABNORMAL HIGH (ref <150)
Triglycerides: 1300 mg/dL — ABNORMAL HIGH (ref <150)
VLDL: UNDETERMINED mg/dL (ref 0–40)
VLDL: UNDETERMINED mg/dL (ref 0–40)

## 2019-08-27 LAB — LDL CHOLESTEROL, DIRECT: Direct LDL: UNDETERMINED mg/dL (ref 0–99)

## 2019-08-27 LAB — LIPASE, BLOOD: Lipase: 317 U/L — ABNORMAL HIGH (ref 11–51)

## 2019-08-27 LAB — TSH: TSH: 1.728 u[IU]/mL (ref 0.350–4.500)

## 2019-08-27 LAB — PHOSPHORUS: Phosphorus: 1.6 mg/dL — ABNORMAL LOW (ref 2.5–4.6)

## 2019-08-27 LAB — MAGNESIUM: Magnesium: 2.3 mg/dL (ref 1.7–2.4)

## 2019-08-27 MED ORDER — METOPROLOL TARTRATE 5 MG/5ML IV SOLN: 5.0000 mg | INTRAVENOUS | Status: DC | PRN

## 2019-08-27 MED ORDER — DEXTROSE 10 % IV SOLN: INTRAVENOUS | Status: DC

## 2019-08-27 MED ORDER — TRAMADOL HCL 50 MG PO TABS: 50.0000 mg | ORAL_TABLET | Freq: Once | ORAL | Status: DC

## 2019-08-27 MED ORDER — SODIUM CHLORIDE 0.9 % IV SOLN: INTRAVENOUS | Status: DC

## 2019-08-27 MED ORDER — AMIODARONE HCL IN DEXTROSE 360-4.14 MG/200ML-% IV SOLN: 30.0000 mg/h | INTRAVENOUS | Status: DC

## 2019-08-27 MED ORDER — METOPROLOL TARTRATE 25 MG PO TABS
25.0000 mg | ORAL_TABLET | Freq: Two times a day (BID) | ORAL | Status: DC
  Administered 2019-08-27 – 2019-08-28 (×3): 25 mg via ORAL
  Filled 2019-08-27 (×3): qty 1

## 2019-08-27 MED ORDER — INSULIN (MYXREDLIN) INFUSION FOR HYPERTRIGLYCERIDEMIA
4.0000 [IU]/h | INTRAVENOUS | Status: DC
  Administered 2019-08-27 – 2019-08-29 (×4): 8 [IU]/h via INTRAVENOUS
  Administered 2019-08-29 – 2019-08-30 (×2): 4 [IU]/h via INTRAVENOUS
  Filled 2019-08-27 (×5): qty 100

## 2019-08-27 MED ORDER — KETOROLAC TROMETHAMINE 30 MG/ML IJ SOLN
30.0000 mg | Freq: Once | INTRAMUSCULAR | Status: AC
  Administered 2019-08-27: 30 mg via INTRAVENOUS
  Filled 2019-08-27: qty 1

## 2019-08-27 MED ORDER — DILTIAZEM HCL 25 MG/5ML IV SOLN
10.0000 mg | Freq: Once | INTRAVENOUS | Status: AC
  Administered 2019-08-27: 10 mg via INTRAVENOUS

## 2019-08-27 MED ORDER — HEPARIN BOLUS VIA INFUSION
5000.0000 [IU] | Freq: Once | INTRAVENOUS | Status: DC
  Filled 2019-08-27: qty 5000

## 2019-08-27 MED ORDER — HEPARIN (PORCINE) 25000 UT/250ML-% IV SOLN: 1800.0000 [IU]/h | INTRAVENOUS | Status: DC

## 2019-08-27 MED ORDER — ICOSAPENT ETHYL 1 G PO CAPS
2.0000 g | ORAL_CAPSULE | Freq: Two times a day (BID) | ORAL | Status: DC
  Administered 2019-08-27 – 2019-09-01 (×11): 2 g via ORAL
  Filled 2019-08-27 (×12): qty 2

## 2019-08-27 MED ORDER — METOPROLOL TARTRATE 5 MG/5ML IV SOLN
5.0000 mg | INTRAVENOUS | Status: DC | PRN
  Administered 2019-08-27: 5 mg via INTRAVENOUS
  Filled 2019-08-27: qty 5

## 2019-08-27 MED ORDER — INSULIN (MYXREDLIN) INFUSION FOR HYPERTRIGLYCERIDEMIA
0.1000 [IU]/kg/h | INTRAVENOUS | Status: DC
  Administered 2019-08-27: 0.1 [IU]/kg/h via INTRAVENOUS
  Filled 2019-08-27: qty 100

## 2019-08-27 MED ORDER — LABETALOL HCL 5 MG/ML IV SOLN
10.0000 mg | INTRAVENOUS | Status: DC | PRN
  Administered 2019-08-27: 10 mg via INTRAVENOUS
  Filled 2019-08-27: qty 4

## 2019-08-27 MED ORDER — AMIODARONE HCL IN DEXTROSE 360-4.14 MG/200ML-% IV SOLN: 60.0000 mg/h | INTRAVENOUS | Status: DC

## 2019-08-27 MED ORDER — AMIODARONE LOAD VIA INFUSION
150.0000 mg | Freq: Once | INTRAVENOUS | Status: DC
  Filled 2019-08-27: qty 83.34

## 2019-08-27 MED ORDER — DEXTROSE 5 % IV SOLN: INTRAVENOUS | Status: DC

## 2019-08-27 NOTE — Consult Note (Signed)
Cardiology Consultation:   Patient ID: Douglas [name].; [id number]; 1985-05-01   Admit date: 08/25/2019 Date of Consult: 08/27/2019  Primary Care Provider: Tomasa Hose, NP Primary Cardiologist: Evalina Field, MD New Primary Electrophysiologist:  None   Patient Profile:   Douglas [name]. is a 34 y.o. male with a hx of DM, HTN, HLD, pancreatitis, obesity, who is being seen today for the evaluation of Afib at the request of Dr Sabino Gasser.  History of Present Illness:   Douglas [name] was admitted 07/09 with DKA, and acute pancreatitis in setting of Trig 8,286, lipase 1085. His triglycerides have trended down since then, lipase has improved as well.  This is his third episode of pancreatitis.  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  He does not miss his medications, but has been started on pantoprazole by his GI doctor.  This was supposed to help him lose weight.  He states he has lost 60-70 pounds in the last 6 months.  Until his acute illness, he was feeling well.  He never gets palpitations, no presyncope or syncope.  When he was under a great deal of stress in his 53s, he had chest pain and was evaluated in the emergency room, but was never told he had CAD.  At approximately 3 AM today, he went into rapid atrial fibrillation with a heart rate greater than 160.  He was completely unaware of this.  He denies chest pain, shortness of breath, presyncope or syncope.  He had no awareness of the rapid or irregular heart rate.  He got 1 injection of Cardizem IV 10 mg, but low blood pressure limited him being on a drip. Amiodarone was ordered, but had not been started yet.  He spontaneously converted to sinus rhythm at 8:03 this a.m.  After he converted to sinus rhythm, he did not feel any different at all.  During his previous admissions for pancreatitis, he did not have any arrhythmia.  His TSH has not been checked, but he has no history of thyroid  problems.   Past Medical History:  Diagnosis Date  . Diabetes ([place])   . Hypercholesteremia   . Hypertension   . Pancreatitis 08/2017    Past Surgical History:  Procedure Laterality Date  . TONSILLECTOMY    . WISDOM TOOTH EXTRACTION       Prior to Admission medications   Medication Sig Start Date End Date Taking? Authorizing Provider  atorvastatin (LIPITOR) 40 MG tablet Take 1 tablet (40 mg total) by mouth daily at 6 PM. 09/22/17  Yes Mikhail, Velta Addison, DO  fenofibrate 160 MG tablet Take 160 mg by mouth daily. 10/30/16  Yes [provider]  glipiZIDE (GLUCOTROL) 5 MG tablet Take 5 mg by mouth 2 (two) times daily. 05/08/19  Yes [provider]  hydrochlorothiazide (HYDRODIURIL) 25 MG tablet Take 25 mg by mouth daily. 10/29/16  Yes [provider]  ibuprofen (ADVIL,MOTRIN) 800 MG tablet Take 800 mg by mouth daily as needed for pain. 09/24/16  Yes [provider]  metFORMIN (GLUCOPHAGE) 500 MG tablet Take 2 tablets in the AM and 1 tablet in the PM   Yes [provider]  pantoprazole (PROTONIX) 40 MG tablet Take 40 mg by mouth daily. 06/26/19  Yes [provider]  blood glucose meter kit and supplies Dispense based on patient and insurance preference. Use up to four times daily as directed. (FOR ICD-10 E10.9, E11.9). 09/22/17   Cristal Ford, DO  metFORMIN ([place])  500 MG tablet Take 1 tablet (500 mg total) by mouth 2 (two) times daily with a meal. 09/22/17 10/22/17  Cristal Ford, DO    Inpatient Medications: Scheduled Meds: . amiodarone  150 mg Intravenous Once  . Chlorhexidine Gluconate Cloth  6 each Topical Q0600  . heparin  5,000 Units Intravenous Once  . metoprolol tartrate  25 mg Oral BID  . mupirocin ointment  1 application Nasal BID  . sodium chloride flush  10-40 mL Intracatheter Q12H   Continuous Infusions: . sodium chloride 75 mL/hr at 08/27/19 0801  . amiodarone     Followed by  . amiodarone    . dextrose 75 mL/hr at  08/27/19 0803  . heparin    . insulin 0.1 Units/kg/hr (08/27/19 0804)   PRN Meds: dextrose, hydrALAZINE, HYDROmorphone (DILAUDID) injection, labetalol, metoprolol tartrate, ondansetron (ZOFRAN) IV, prochlorperazine, sodium chloride flush  Allergies:   No Known Allergies  Social History:   Social History   Socioeconomic History  . Marital status: Married    Spouse name: Not on file  . Number of children: 5  . Years of education: Not on file  . Highest education level: Not on file  Occupational History  . Occupation: Supply Materials engineer: [employer]  Tobacco Use  . Smoking status: Former Smoker    Types: Cigarettes    Quit date: 02/17/2003    Years since quitting: 16.5  . Smokeless tobacco: Never Used  . Tobacco comment: "teenage years"  Vaping Use  . Vaping Use: Never used  Substance and Sexual Activity  . Alcohol use: Yes    Comment: a drink 2-3 x a year  . Drug use: No  . Sexual activity: Not on file  Other Topics Concern  . Not on file  Social History Narrative   Lives w/ wife and 5 children.   Social Determinants of Health   Financial Resource Strain:   . Difficulty of Paying Living Expenses:   Food Insecurity:   . Worried About Charity fundraiser in the Last Year:   . Arboriculturist in the Last Year:   Transportation Needs:   . Film/video editor (Medical):   Marland Kitchen Lack of Transportation (Non-Medical):   Physical Activity:   . Days of Exercise per Week:   . Minutes of Exercise per Session:   Stress:   . Feeling of Stress :   Social Connections:   . Frequency of Communication with Friends and Family:   . Frequency of Social Gatherings with Friends and Family:   . Attends Religious Services:   . Active Member of Clubs or Organizations:   . Attends Archivist Meetings:   Marland Kitchen Marital Status:   Intimate Partner Violence:   . Fear of Current or Ex-Partner:   . Emotionally Abused:   Marland Kitchen Physically Abused:   . Sexually Abused:      Family History:   Family History  Problem Relation Age of Onset  . Hypercholesterolemia Mother   . Hypertension Mother   . Diabetes Father   . Colon cancer Neg Hx   . Esophageal cancer Neg Hx   . Inflammatory bowel disease Neg Hx   . Liver disease Neg Hx   . Pancreatic cancer Neg Hx   . Rectal cancer Neg Hx   . Stomach cancer Neg Hx    Family Status:  Family Status  Relation Name Status  . Mother  Alive  . Father  Alive  .  MGM  Alive  . MGF  Alive  . PGM  Alive  . PGF  Alive  . Neg Hx  (Not Specified)    ROS:  Please see the history of present illness.  All other ROS reviewed and negative.     Physical Exam/Data:   Vitals:   08/27/19 0900 08/27/19 0930 08/27/19 1000 08/27/19 1015  BP: 109/64  105/68   Pulse: 94 94 95 94  Resp: 17 (!) 29 (!) 23 (!) 21  Temp:      TempSrc:      SpO2: 96% 96% 98% 98%  Weight:      Height:        Intake/Output Summary (Last 24 hours) at 08/27/2019 1120 Last data filed at 08/27/2019 0900 Gross per 24 hour  Intake 1072.96 ml  Output 650 ml  Net 422.96 ml    Last 3 Weights 08/25/2019 08/25/2019 02/06/2019  Weight (lbs) 260 lb 262 lb 313 lb 6.4 oz  Weight (kg) 117.935 kg 118.842 kg 142.157 kg     Body mass index is 35.26 kg/m.   General:  Well nourished, well developed, male in no acute distress HEENT: normal Lymph: no adenopathy Neck: JVD -not elevated Endocrine:  No thryomegaly Vascular: No carotid bruits; 4/4 extremity pulses 2+  Cardiac:  normal S1, S2; RRR; soft murmur Lungs:  clear bilaterally, no wheezing, rhonchi or rales  Abd: soft, nontender, no hepatomegaly  Ext: no edema Musculoskeletal:  No deformities, BUE and BLE strength normal and equal Skin: warm and dry  Neuro:  CNs 2-12 intact, no focal abnormalities noted Psych:  Normal affect   EKG:  The EKG was personally reviewed and demonstrates:   07/10 ECG is ST, HR 113, ?LVH 07/11 ECG is atrial fib, RVR, HR 168 Telemetry:  Telemetry was personally reviewed  and demonstrates:  SR>>Afib RVR   CV studies:   ECHO: 08/26/2019 1. Abnormal septal motion . Left ventricular ejection fraction, by  estimation, is 50 to 55%. The left ventricle has low normal function. The  left ventricle has no regional wall motion abnormalities. There is mild  left ventricular hypertrophy. Left  ventricular diastolic parameters are consistent with Grade I diastolic  dysfunction (impaired relaxation).  2. Right ventricular systolic function is normal. The right ventricular  size is normal.  3. The mitral valve is normal in structure. No evidence of mitral valve  regurgitation. No evidence of mitral stenosis.  4. The aortic valve is tricuspid. Aortic valve regurgitation is not  visualized. No aortic stenosis is present.  5. The inferior vena cava is normal in size with greater than 50%  respiratory variability, suggesting right atrial pressure of 3 mmHg.    Laboratory Data:   Chemistry Recent Labs  Lab 08/26/19 2000 08/27/19 0336 08/27/19 0800  NA 128* 133* 133*  K 4.5 4.0 3.6  CL 100 104 104  CO2 18* 16* 20*  GLUCOSE 194* 165* 134*  BUN 15 16 15   CREATININE 0.68 1.20 1.15  CALCIUM 7.5* 7.9* 8.3*  GFRNONAA >60 >60 >60  GFRAA >60 >60 >60  ANIONGAP 10 13 9     Lab Results  Component Value Date   ALT 18 08/27/2019   AST 41 08/27/2019   ALKPHOS 57 08/27/2019   BILITOT 1.3 (H) 08/27/2019   Lipase  Date Value Ref Range Status  08/27/2019 317 (H) 11 - 51 U/L Final    Comment:    POST-ULTRACENTRIFUGATION Performed at [facility], [place] [address] [place], [place]  [address]   08/26/2019 350 (H) 11 - 51 U/L Final    Comment:    POST-ULTRACENTRIFUGATION LIPEMIC SPECIMEN Performed at [facility], [place] [address], [place], [place] [address]   08/25/2019 1,035 (H) 11 - 51 U/L Final    Comment:    RESULTS CONFIRMED BY MANUAL DILUTION Performed at [facility], [place] [address] [place], [place] [address]    Hematology Recent  Labs  Lab 08/25/19 [phone] 08/26/19 0249 08/27/19 0336  WBC 6.2 3.3* 6.4  RBC 4.91 6.12* 5.38  HGB 15.3 17.8* 15.4  HCT 42.5 53.6* 47.2  MCV 86.6 87.6 87.7  MCH 31.2 29.1 28.6  MCHC 36.0 33.2 32.6  RDW 13.2 14.1 14.3  PLT 226 261 246   Cardiac Enzymes High Sensitivity Troponin:   Recent Labs  Lab 08/26/19 0925 08/26/19 1126  TROPONINIHS 7 8      BNPNo results for input(s): BNP, PROBNP in the last 168 hours.  DDimer No results for input(s): DDIMER in the last 168 hours. TSH: No results found for: TSH Lipids: Lab Results  Component Value Date   CHOL 617 (H) 08/27/2019   HDL 24 (L) 08/27/2019   LDLCALC UNABLE TO CALCULATE IF TRIGLYCERIDE OVER 400 mg/dL 08/27/2019   LDLDIRECT 96.3 08/26/2019   TRIG 1,689 (H) 08/27/2019   CHOLHDL NOT REPORTED DUE TO HIGH TRIGLYCERIDES 08/27/2019   Triglycerides  Date Value Ref Range Status  08/27/2019 1,689 (H) <150 mg/dL Final    Comment:    RESULTS CONFIRMED BY MANUAL DILUTION  08/26/2019 3,820 (H) <150 mg/dL Final    Comment:    RESULTS CONFIRMED BY MANUAL DILUTION  08/26/2019 >5,000 (H) <150 mg/dL Final    Comment:    RESULTS CONFIRMED BY MANUAL DILUTION  08/25/2019 >5,000 (H) <150 mg/dL Corrected    Comment:    RESULTS CONFIRMED BY MANUAL DILUTION Performed at [facility], [address] [place], [place] [address] CORRECTED ON 07/10 AT 6659: PREVIOUSLY REPORTED AS 8286 RESULTS CONFIRMED BY MANUAL DILUTION    HgbA1c: Lab Results  Component Value Date   HGBA1C 8.9 (H) 09/16/2017   Magnesium:  Magnesium  Date Value Ref Range Status  08/27/2019 2.3 1.7 - 2.4 mg/dL Final    Comment:    POST-ULTRACENTRIFUGATION Performed at [facility], [place] [address] [place], [place] [address]      Radiology/Studies:  CT ABDOMEN PELVIS W CONTRAST  Result Date: 08/25/2019 CLINICAL DATA:  Epigastric pain. History of pancreatitis. Elevated lipase. EXAM: CT ABDOMEN AND PELVIS WITH CONTRAST TECHNIQUE: Multidetector CT  imaging of the abdomen and pelvis was performed using the standard protocol following bolus administration of intravenous contrast. CONTRAST:  122m OMNIPAQUE IOHEXOL 300 MG/ML  SOLN COMPARISON:  CT 09/18/2017 FINDINGS: Lower chest: No consolidation or pleural fluid. Hepatobiliary: Mild diffuse steatosis without evidence of focal lesion. Physiologically distended gallbladder. No calcified gallstone. Common bile duct is not well-defined but no evidence of biliary dilatation. Pancreas: Diffuse peripancreatic edema, fat stranding, and free fluid about the entirety of the pancreas and retroperitoneum. Homogeneous pancreatic enhancement without evidence of necrosis. Non organized free fluid tracks into the upper abdomen, right pericolic gutter car lesser sac and retroperitoneum without organized fluid collection. No soft tissue air. Splenic vein is patent. Spleen: Normal in size without focal abnormality. Adrenals/Urinary Tract: Normal adrenal glands. No hydronephrosis or perinephric edema. There is slight prominence of the right renal collecting system and ureter which is likely reactive. No obstructive change. Homogeneous renal enhancement with  symmetric excretion on delayed phase imaging. Urinary bladder is physiologically distended without wall thickening. Stomach/Bowel: Intraluminal fluid in the stomach. Mild mucosal enhancement involving the peri pyloric stomach and duodenum without obvious wall thickening, likely reactive. There is no small bowel dilatation, inflammation or obstruction. High-riding cecum in the right mid abdomen. Normal appendix courses in the right lower quadrant. Small to moderate volume of colonic stool. There is no colonic wall thickening or inflammation. Vascular/Lymphatic: Normal caliber abdominal aorta. Patent portal and splenic veins. No acute vascular findings. No abdominopelvic adenopathy. Reproductive: Prostate is unremarkable. Other: Inflammatory changes stranding and non organized  free fluid in the upper abdomen related to pancreatic inflammation. Free fluid tracks into the upper abdomen, right upper quadrant, right pericolic gutter, retroperitoneum, and lesser sac. Small amount of free fluid is tract into the pelvis. No organized fluid collection. No free air. Tiny fat containing umbilical hernia. Musculoskeletal: Chronic bilateral L5 pars interarticularis defects. No significant listhesis. There are no acute or suspicious osseous abnormalities. IMPRESSION: 1. Acute edematous pancreatitis. Significant peripancreatic inflammatory changes. No evidence of pancreatic necrosis or acute peripancreatic fluid collection. 2. Mild hepatic steatosis. 3. Chronic bilateral L5 pars interarticularis defects without significant listhesis. Electronically Signed   By: Keith Rake M.D.   On: 08/25/2019 16:34   DG Chest Portable 1 View  Result Date: 08/25/2019 CLINICAL DATA:  Cough. EXAM: PORTABLE CHEST 1 VIEW COMPARISON:  No prior. FINDINGS: Mediastinum hilar structures normal. Heart size normal. No focal infiltrate. No pleural effusion or pneumothorax. No acute bony abnormality. IMPRESSION: No acute cardiopulmonary disease. Electronically Signed   By: Marcello Moores  Register   On: 08/25/2019 06:19   DG Abd Portable 2V  Result Date: 08/26/2019 CLINICAL DATA:  Abdominal pain.  Pancreatitis. EXAM: PORTABLE ABDOMEN - 2 VIEW COMPARISON:  CT abdomen and pelvis August 25, 2019 FINDINGS: Supine and upright images were obtained. There is moderate stool in the colon. There is no bowel dilatation or air-fluid level to suggest bowel obstruction. No free air. Lung bases are clear. IMPRESSION: Moderate stool in colon. No bowel obstruction or free air. Lung bases are clear. Electronically Signed   By: Lowella Grip III M.D.   On: 08/26/2019 12:19   ECHOCARDIOGRAM COMPLETE  Result Date: 08/26/2019    ECHOCARDIOGRAM REPORT   Patient Name:   Douglas [name]. Date of Exam: 08/26/2019 Medical Rec #:  [id number]                Height:       72.0 in Accession #:    [id number]              Weight:       260.0 lb Date of Birth:  02/10/1986               BSA:          2.382 m Patient Age:    34 years                BP:           137/76 mmHg Patient Gender: M                       HR:           109 bpm. Exam Location:  Inpatient Procedure: 2D Echo Indications:    Abnormal ECG R94.31  History:        Patient has no prior history of Echocardiogram examinations.  Risk Factors:Hypertension and Diabetes.  Sonographer:    Mikki Santee RDCS (AE) Referring Phys: [place]  1. Abnormal septal motion . Left ventricular ejection fraction, by estimation, is 50 to 55%. The left ventricle has low normal function. The left ventricle has no regional wall motion abnormalities. There is mild left ventricular hypertrophy. Left ventricular diastolic parameters are consistent with Grade I diastolic dysfunction (impaired relaxation).  2. Right ventricular systolic function is normal. The right ventricular size is normal.  3. The mitral valve is normal in structure. No evidence of mitral valve regurgitation. No evidence of mitral stenosis.  4. The aortic valve is tricuspid. Aortic valve regurgitation is not visualized. No aortic stenosis is present.  5. The inferior vena cava is normal in size with greater than 50% respiratory variability, suggesting right atrial pressure of 3 mmHg. FINDINGS  Left Ventricle: Abnormal septal motion. Left ventricular ejection fraction, by estimation, is 50 to 55%. The left ventricle has low normal function. The left ventricle has no regional wall motion abnormalities. The left ventricular internal cavity size was normal in size. There is mild left ventricular hypertrophy. Left ventricular diastolic parameters are consistent with Grade I diastolic dysfunction (impaired relaxation). Right Ventricle: The right ventricular size is normal. No increase in right ventricular wall thickness.  Right ventricular systolic function is normal. Left Atrium: Left atrial size was normal in size. Right Atrium: Right atrial size was normal in size. Pericardium: There is no evidence of pericardial effusion. Mitral Valve: The mitral valve is normal in structure. Normal mobility of the mitral valve leaflets. No evidence of mitral valve regurgitation. No evidence of mitral valve stenosis. Tricuspid Valve: The tricuspid valve is normal in structure. Tricuspid valve regurgitation is not demonstrated. No evidence of tricuspid stenosis. Aortic Valve: The aortic valve is tricuspid. Aortic valve regurgitation is not visualized. No aortic stenosis is present. Pulmonic Valve: The pulmonic valve was normal in structure. Pulmonic valve regurgitation is not visualized. No evidence of pulmonic stenosis. Aorta: The aortic root is normal in size and structure. Venous: The inferior vena cava is normal in size with greater than 50% respiratory variability, suggesting right atrial pressure of 3 mmHg. IAS/Shunts: No atrial level shunt detected by color flow Doppler.  LEFT VENTRICLE PLAX 2D LVIDd:         4.20 cm  Diastology LVIDs:         2.80 cm  LV e' lateral:   9.68 cm/s LV PW:         1.50 cm  LV E/e' lateral: 5.7 LV IVS:        1.20 cm  LV e' medial:    8.92 cm/s LVOT diam:     2.50 cm  LV E/e' medial:  6.2 LV SV:         61 LV SV Index:   26 LVOT Area:     4.91 cm  RIGHT VENTRICLE RV S prime:     26.10 cm/s TAPSE (M-mode): 2.0 cm LEFT ATRIUM             Index      RIGHT ATRIUM           Index LA diam:        3.20 cm 1.34 cm/m RA Area:     11.80 cm LA Vol (A2C):   22.1 ml 9.28 ml/m RA Volume:   22.80 ml  9.57 ml/m LA Vol (A4C):   18.3 ml 7.68 ml/m LA Biplane Vol: 21.2 ml 8.90 ml/m  AORTIC VALVE LVOT Vmax:   77.70 cm/s LVOT Vmean:  50.100 cm/s LVOT VTI:    0.125 m  AORTA Ao Root diam: 3.20 cm MITRAL VALVE MV Area (PHT): 3.42 cm    SHUNTS MV Decel Time: 222 msec    Systemic VTI:  0.12 m MV E velocity: 55.20 cm/s  Systemic  Diam: 2.50 cm MV A velocity: 62.70 cm/s MV E/A ratio:  0.88 Jenkins Rouge MD Electronically signed by Jenkins Rouge MD Signature Date/Time: 08/26/2019/3:16:03 PM    Final    [place] EKG SITE RITE  Result Date: 08/26/2019 If Site Rite image not attached, placement could not be confirmed due to current cardiac rhythm.   Assessment and Plan:   1.  PAF: -Happened in the setting of acute illness, but he has been improving since admission -Completely asymptomatic despite a heart rate of greater than 150 -Spontaneously converted a few hours after 1 dose of IV diltiazem 10 mg -SBP in the 100s limits meds. -After he converted, he was started on metoprolol 25 mg twice daily -CHA2DS2-VASc is 2 (DM, HTN), MD advise if anticoagulation is indicated  2.  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   SignedRosaria Ferries, PA-C  08/27/2019 11:20 AM

## 2019-08-27 NOTE — Progress Notes (Addendum)
Initial Nutrition Assessment  DOCUMENTATION CODES:   Obesity unspecified  INTERVENTION:  Diet advancement as appropriate/able pending illness progression.   NUTRITION DIAGNOSIS:   Inadequate oral intake related to inability to eat as evidenced by NPO status.  GOAL:   Patient will meet greater than or equal to 90% of their needs  MONITOR:   Skin, Weight trends, Labs, I & O's, Diet advancement  REASON FOR ASSESSMENT:   Malnutrition Screening Tool    ASSESSMENT:   34 year old African-American male with PMH obesity, hypertriglyceride induced pancreatitis, GERD, type 2 diabetes, hypertension, hyperlipidemia who presented to the hospital with severe abdominal pain, nausea, vomiting. CT abdomen/pelvis which revealed diffuse peripancreatic edema, fat stranding, and free fluid around the pancreas consistent with acute edematous pancreatitis.  RD working remotely.  Pt unavailable during attempted time of contact. Pt currently NPO. Per MD, pt reports a 60-70 lb weight loss over the past 6 months and pt reports feeling well prior to current acute illness. Pt currently on DKA protocol and with acute pancreatitis. Diet advancement as appropriate/able pending illness progression.  Unable to complete Nutrition-Focused physical exam at this time.   Labs and medications reviewed. Lipase elevated at 317. Triglycerides elevated at 1689. Cholesterol elevated at 617.   Diet Order:   Diet Order            Diet NPO time specified  Diet effective now                 EDUCATION NEEDS:   Not appropriate for education at this time  Skin:  Skin Assessment: Reviewed RN Assessment  Last BM:  7/6  Height:   Ht Readings from Last 1 Encounters:  08/25/19 6' (1.829 m)    Weight:   Wt Readings from Last 1 Encounters:  08/25/19 117.9 kg    BMI:  Body mass index is 35.26 kg/m.  Estimated Nutritional Needs:   Kcal:  2150-2350  Protein:  115-130 grams  Fluid:  >/= 2  L/day  Roslyn Smiling, MS, RD, LDN RD pager number/after hours weekend pager number on Amion.

## 2019-08-27 NOTE — Progress Notes (Signed)
PROGRESS NOTE    Douglas [name].   URK:[id number]  DOB: 1985/06/08  DOA: 08/25/2019     2  PCP: Tomasa Hose, NP  CC: N/V, abd pain  Hospital Course: Douglas [name] is a 34 year old African-American male with PMH obesity, hypertriglyceride induced pancreatitis, GERD, type 2 diabetes, hypertension, hyperlipidemia who presented to the hospital with severe abdominal pain, nausea, vomiting.  He stated the pain started around 2 AM and awoke him from sleep.  He has had ongoing nausea and vomiting throughout the day and presented for further evaluation.  In the ER he underwent work-up including CT abdomen/pelvis which revealed diffuse peripancreatic edema, fat stranding, and free fluid around the pancreas consistent with acute edematous pancreatitis.  No pancreatic necrosis appreciated or abscess. Notable labs include: WBC 6.2, Hgb 15.3, PLTC 226 Na 130, K 5.7, Co2 12, Glucose 242, BUN 10, Creat 0.96, AG 20, TB 4.9, AST 82, ALT Lipase 1035 VBG 7.23/31 Beta hydroxybutyrate 4.83  He is admitted for further treatment of DKA and hyperTG induced pancreatitis. TG initially on workup was 8,286.  He denies alcohol use.  He responded well to treatment of DKA and hyperTG.  On the night of 08/26/19 he converted to afib with RVR and was given a cardizem bolus and persisted in afib with RVR but became hypotensive. He was asymptomatic and was started on oral lopressor.  He converted spontaneously back to NSR on 08/27/19.    Interval History:  Converted into A. fib with RVR overnight.  He was given a dose of Cardizem bolus without response but became more hypotensive.  Case was discussed with cardiology this morning and prior to initiation of amiodarone and heparin drip, he converted back to NSR. Called and spoke with mom while in his room and gave her an update as well.  Old records reviewed in assessment of this patient  ROS: Constitutional: negative for chills and fevers, Respiratory:  negative for cough, Cardiovascular: negative for chest pain and palpitations and Gastrointestinal: negative for abdominal pain  Assessment & Plan: DKA (diabetic ketoacidoses) ([place]) - resolved with treatment; d/c protocol on 7/11 - see pancreatitis   Acute pancreatitis -Patient denies alcohol use.  Meets criteria for pancreatitis in setting of lipase, CT abd/pelvis, clinical exam -Etiology is considered due to hypertriglyceridemia.  TG 8,286 initially -DKA has resolved - will transition to hyperTG pancreatitis protocol. Use insulin drip 0.1 units/hg/hr - start on D5W and NS; will adjust drips as necessary - continue q1h CBG checks - see hyperTG -Continue Zofran, Compazine, dilaudid -keep NPO; ice chips okay - trend lipase and lipid profile   Hypertension -Use labetalol or hydralazine as needed  Type 2 diabetes mellitus without complication (HCC) -Follow-up A1c -Patient states he takes Metformin only at home, no insulin -Will reinitiate therapy after resolution of DKA and pancreatitis  Hyperlipidemia -Last lipid panel reviewed from August 2019.  Triglycerides were over 1000 at that time -Patient takes Lipitor and fenofibrate at home.  Will max out both once he is tolerating p.o. again.  Will also need to add on fish oil 2 g twice daily -Follow-up lipid profile and trend every 12 hours while treating pancreatitis. Initial TG 8,286 - see pancreatitis as well   Atrial fibrillation with RVR (HCC) - no prior history of similar; etiology likely in setting of acute illness - refractory to cardizem bolus; BP hypotensive; options would be chemical vs electrical cardioversion - cardiology consulted, appreciate assistance - had ordered heparin gtt and amiodarone drip but patient  converted back to NSR morning of 08/27/19 prior to initiation of drips; therefore will hold off on starting unless converts again or per cardiology rec's - echo obtained on 7/10 prior to afib: EF 50-55%, Gr 1 DD -  troponin x 1 negative on 7/10; denies any CP    Antimicrobials: N/AA  DVT prophylaxis: Lovenox Code Status: Full Family Communication: Mother via phone Disposition Plan:   Patient came from: Home         Barriers to d/c OR conditions which need to be met to effect a safe d/c: None  The current disposition plan is discharge to: Home Status is: Inpatient  Remains inpatient appropriate because:Persistent severe electrolyte disturbances, IV treatments appropriate due to intensity of illness or inability to take PO and Inpatient level of care appropriate due to severity of illness   Dispo: The patient is from: Home              Anticipated d/c is to: Home              Anticipated d/c date is: > 3 days              Patient currently is not medically stable to d/c.  Objective: Blood pressure 101/64, pulse 89, temperature 98.5 F (36.9 C), temperature source Oral, resp. rate (!) 21, height 6' (1.829 m), weight 117.9 kg, SpO2 96 %.   Intake/Output Summary (Last 24 hours) at 08/27/2019 1309 Last data filed at 08/27/2019 0900 Gross per 24 hour  Intake 1072.96 ml  Output 650 ml  Net 422.96 ml   Last Weight  Most recent update: 08/25/2019  5:30 AM   Weight  117.9 kg (260 lb)           Examination: General appearance: young adult man resting in bed appearing much more comfortable this am Head: Normocephalic, without obvious abnormality Eyes: EOMI Lungs: clear to auscultation bilaterally Heart: regular rate and rhythm and S1, S2 normal Abdomen: soft, less TTP, BS hypoactive, ND Extremities: no edema Skin: Skin color, texture, turgor normal. No rashes or lesions or mobility and turgor normal Neurologic: Grossly normal   Consultants:   Cardiology  Procedures:   n/a  Data Reviewed: I have personally reviewed following labs and imaging studies Results for orders placed or performed during the hospital encounter of 08/25/19 (from the past 24 hour(s))  Glucose, capillary      Status: Abnormal   Collection Time: 08/26/19  1:32 PM  Result Value Ref Range   Glucose-Capillary 214 (H) 70 - 99 mg/dL  Glucose, capillary     Status: Abnormal   Collection Time: 08/26/19  2:31 PM  Result Value Ref Range   Glucose-Capillary 210 (H) 70 - 99 mg/dL  Glucose, capillary     Status: Abnormal   Collection Time: 08/26/19  3:30 PM  Result Value Ref Range   Glucose-Capillary 234 (H) 70 - 99 mg/dL  Comprehensive metabolic panel     Status: Abnormal   Collection Time: 08/26/19  4:00 PM  Result Value Ref Range   Sodium 131 (L) 135 - 145 mmol/L   Potassium 4.3 3.5 - 5.1 mmol/L   Chloride 103 98 - 111 mmol/L   CO2 14 (L) 22 - 32 mmol/L   Glucose, Bld 212 (H) 70 - 99 mg/dL   BUN 16 6 - 20 mg/dL   Creatinine, Ser 1.19 0.61 - 1.24 mg/dL   Calcium 7.3 (L) 8.9 - 10.3 mg/dL   Total Protein 6.6 6.5 - 8.1 g/dL  Albumin 2.8 (L) 3.5 - 5.0 g/dL   AST 47 (H) 15 - 41 U/L   ALT 30 0 - 44 U/L   Alkaline Phosphatase 61 38 - 126 U/L   Total Bilirubin 1.9 (H) 0.3 - 1.2 mg/dL   GFR calc non Af Amer >60 >60 mL/min   GFR calc Af Amer >60 >60 mL/min   Anion gap 14 5 - 15  Glucose, capillary     Status: Abnormal   Collection Time: 08/26/19  4:31 PM  Result Value Ref Range   Glucose-Capillary 207 (H) 70 - 99 mg/dL  Glucose, capillary     Status: Abnormal   Collection Time: 08/26/19  5:31 PM  Result Value Ref Range   Glucose-Capillary 232 (H) 70 - 99 mg/dL   Comment 1 Notify RN    Comment 2 Document in Chart   Glucose, capillary     Status: Abnormal   Collection Time: 08/26/19  6:29 PM  Result Value Ref Range   Glucose-Capillary 224 (H) 70 - 99 mg/dL   Comment 1 Notify RN    Comment 2 Document in Chart   Glucose, capillary     Status: Abnormal   Collection Time: 08/26/19  7:30 PM  Result Value Ref Range   Glucose-Capillary 212 (H) 70 - 99 mg/dL   Comment 1 Notify RN    Comment 2 Document in Chart   Comprehensive metabolic panel     Status: Abnormal   Collection Time: 08/26/19  8:00  PM  Result Value Ref Range   Sodium 128 (L) 135 - 145 mmol/L   Potassium 4.5 3.5 - 5.1 mmol/L   Chloride 100 98 - 111 mmol/L   CO2 18 (L) 22 - 32 mmol/L   Glucose, Bld 194 (H) 70 - 99 mg/dL   BUN 15 6 - 20 mg/dL   Creatinine, Ser 0.68 0.61 - 1.24 mg/dL   Calcium 7.5 (L) 8.9 - 10.3 mg/dL   Total Protein >12.0 (H) 6.5 - 8.1 g/dL   Albumin 2.7 (L) 3.5 - 5.0 g/dL   AST 43 (H) 15 - 41 U/L   ALT 23 0 - 44 U/L   Alkaline Phosphatase 64 38 - 126 U/L   Total Bilirubin 2.8 (H) 0.3 - 1.2 mg/dL   GFR calc non Af Amer >60 >60 mL/min   GFR calc Af Amer >60 >60 mL/min   Anion gap 10 5 - 15  Glucose, capillary     Status: Abnormal   Collection Time: 08/26/19  8:30 PM  Result Value Ref Range   Glucose-Capillary 206 (H) 70 - 99 mg/dL   Comment 1 Notify RN    Comment 2 Document in Chart   Glucose, capillary     Status: Abnormal   Collection Time: 08/26/19  9:29 PM  Result Value Ref Range   Glucose-Capillary 215 (H) 70 - 99 mg/dL   Comment 1 Notify RN    Comment 2 Document in Chart   Glucose, capillary     Status: Abnormal   Collection Time: 08/26/19 10:31 PM  Result Value Ref Range   Glucose-Capillary 202 (H) 70 - 99 mg/dL   Comment 1 Notify RN    Comment 2 Document in Chart   Glucose, capillary     Status: Abnormal   Collection Time: 08/26/19 11:28 PM  Result Value Ref Range   Glucose-Capillary 210 (H) 70 - 99 mg/dL   Comment 1 Notify RN    Comment 2 Document in Chart   Glucose, capillary  Status: Abnormal   Collection Time: 08/27/19 12:41 AM  Result Value Ref Range   Glucose-Capillary 215 (H) 70 - 99 mg/dL  Glucose, capillary     Status: Abnormal   Collection Time: 08/27/19  1:43 AM  Result Value Ref Range   Glucose-Capillary 204 (H) 70 - 99 mg/dL   Comment 1 Notify RN    Comment 2 Document in Chart   Glucose, capillary     Status: Abnormal   Collection Time: 08/27/19  2:47 AM  Result Value Ref Range   Glucose-Capillary 168 (H) 70 - 99 mg/dL   Comment 1 Notify RN     Comment 2 Document in Chart   Comprehensive metabolic panel     Status: Abnormal   Collection Time: 08/27/19  3:36 AM  Result Value Ref Range   Sodium 133 (L) 135 - 145 mmol/L   Potassium 4.0 3.5 - 5.1 mmol/L   Chloride 104 98 - 111 mmol/L   CO2 16 (L) 22 - 32 mmol/L   Glucose, Bld 165 (H) 70 - 99 mg/dL   BUN 16 6 - 20 mg/dL   Creatinine, Ser 1.20 0.61 - 1.24 mg/dL   Calcium 7.9 (L) 8.9 - 10.3 mg/dL   Total Protein 6.4 (L) 6.5 - 8.1 g/dL   Albumin 2.7 (L) 3.5 - 5.0 g/dL   AST 38 15 - 41 U/L   ALT 22 0 - 44 U/L   Alkaline Phosphatase 57 38 - 126 U/L   Total Bilirubin 1.7 (H) 0.3 - 1.2 mg/dL   GFR calc non Af Amer >60 >60 mL/min   GFR calc Af Amer >60 >60 mL/min   Anion gap 13 5 - 15  Comprehensive metabolic panel     Status: Abnormal   Collection Time: 08/27/19  3:36 AM  Result Value Ref Range   Sodium 134 (L) 135 - 145 mmol/L   Potassium 4.0 3.5 - 5.1 mmol/L   Chloride 105 98 - 111 mmol/L   CO2 15 (L) 22 - 32 mmol/L   Glucose, Bld 161 (H) 70 - 99 mg/dL   BUN 16 6 - 20 mg/dL   Creatinine, Ser 1.15 0.61 - 1.24 mg/dL   Calcium 7.8 (L) 8.9 - 10.3 mg/dL   Total Protein 6.6 6.5 - 8.1 g/dL   Albumin 2.5 (L) 3.5 - 5.0 g/dL   AST 38 15 - 41 U/L   ALT 18 0 - 44 U/L   Alkaline Phosphatase 54 38 - 126 U/L   Total Bilirubin 1.0 0.3 - 1.2 mg/dL   GFR calc non Af Amer >60 >60 mL/min   GFR calc Af Amer >60 >60 mL/min   Anion gap 14 5 - 15  CBC with Differential/Platelet     Status: None   Collection Time: 08/27/19  3:36 AM  Result Value Ref Range   WBC 6.4 4.0 - 10.5 K/uL   RBC 5.38 4.22 - 5.81 MIL/uL   Hemoglobin 15.4 13.0 - 17.0 g/dL   HCT 47.2 39 - 52 %   MCV 87.7 80.0 - 100.0 fL   MCH 28.6 26.0 - 34.0 pg   MCHC 32.6 30.0 - 36.0 g/dL   RDW 14.3 11.5 - 15.5 %   Platelets 246 150 - 400 K/uL   nRBC 0.0 0.0 - 0.2 %   Neutrophils Relative % 69 %   Neutro Abs 4.4 1.7 - 7.7 K/uL   Lymphocytes Relative 22 %   Lymphs Abs 1.4 0.7 - 4.0 K/uL   Monocytes Relative  7 %   Monocytes  Absolute 0.5 0 - 1 K/uL   Eosinophils Relative 0 %   Eosinophils Absolute 0.0 0 - 0 K/uL   Basophils Relative 1 %   Basophils Absolute 0.0 0 - 0 K/uL   Immature Granulocytes 1 %   Abs Immature Granulocytes 0.06 0.00 - 0.07 K/uL  Magnesium     Status: None   Collection Time: 08/27/19  3:36 AM  Result Value Ref Range   Magnesium 2.3 1.7 - 2.4 mg/dL  Phosphorus     Status: Abnormal   Collection Time: 08/27/19  3:36 AM  Result Value Ref Range   Phosphorus 1.6 (L) 2.5 - 4.6 mg/dL  Lipid panel     Status: Abnormal   Collection Time: 08/27/19  3:36 AM  Result Value Ref Range   Cholesterol 617 (H) 0 - 200 mg/dL   Triglycerides 1,689 (H) <150 mg/dL   HDL 24 (L) >40 mg/dL   Total CHOL/HDL Ratio NOT REPORTED DUE TO HIGH TRIGLYCERIDES RATIO   VLDL UNABLE TO CALCULATE IF TRIGLYCERIDE OVER 400 mg/dL 0 - 40 mg/dL   LDL Cholesterol UNABLE TO CALCULATE IF TRIGLYCERIDE OVER 400 mg/dL 0 - 99 mg/dL  Lipase, blood     Status: Abnormal   Collection Time: 08/27/19  3:36 AM  Result Value Ref Range   Lipase 317 (H) 11 - 51 U/L  Glucose, capillary     Status: Abnormal   Collection Time: 08/27/19  3:36 AM  Result Value Ref Range   Glucose-Capillary 183 (H) 70 - 99 mg/dL   Comment 1 Notify RN    Comment 2 Document in Chart   Glucose, capillary     Status: Abnormal   Collection Time: 08/27/19  3:53 AM  Result Value Ref Range   Glucose-Capillary 191 (H) 70 - 99 mg/dL  Glucose, capillary     Status: Abnormal   Collection Time: 08/27/19  4:42 AM  Result Value Ref Range   Glucose-Capillary 174 (H) 70 - 99 mg/dL   Comment 1 Notify RN    Comment 2 Document in Chart   Glucose, capillary     Status: Abnormal   Collection Time: 08/27/19  5:57 AM  Result Value Ref Range   Glucose-Capillary 193 (H) 70 - 99 mg/dL   Comment 1 Notify RN    Comment 2 Document in Chart   Glucose, capillary     Status: Abnormal   Collection Time: 08/27/19  7:01 AM  Result Value Ref Range   Glucose-Capillary 208 (H) 70 - 99  mg/dL  Glucose, capillary     Status: Abnormal   Collection Time: 08/27/19  7:57 AM  Result Value Ref Range   Glucose-Capillary 182 (H) 70 - 99 mg/dL  Comprehensive metabolic panel     Status: Abnormal   Collection Time: 08/27/19  8:00 AM  Result Value Ref Range   Sodium 133 (L) 135 - 145 mmol/L   Potassium 3.6 3.5 - 5.1 mmol/L   Chloride 104 98 - 111 mmol/L   CO2 20 (L) 22 - 32 mmol/L   Glucose, Bld 134 (H) 70 - 99 mg/dL   BUN 15 6 - 20 mg/dL   Creatinine, Ser 1.15 0.61 - 1.24 mg/dL   Calcium 8.3 (L) 8.9 - 10.3 mg/dL   Total Protein 6.8 6.5 - 8.1 g/dL   Albumin 3.1 (L) 3.5 - 5.0 g/dL   AST 41 15 - 41 U/L   ALT 18 0 - 44 U/L   Alkaline Phosphatase 57  38 - 126 U/L   Total Bilirubin 1.3 (H) 0.3 - 1.2 mg/dL   GFR calc non Af Amer >60 >60 mL/min   GFR calc Af Amer >60 >60 mL/min   Anion gap 9 5 - 15  Glucose, capillary     Status: Abnormal   Collection Time: 08/27/19  8:58 AM  Result Value Ref Range   Glucose-Capillary 167 (H) 70 - 99 mg/dL  Glucose, capillary     Status: Abnormal   Collection Time: 08/27/19  9:55 AM  Result Value Ref Range   Glucose-Capillary 126 (H) 70 - 99 mg/dL  Glucose, capillary     Status: None   Collection Time: 08/27/19 11:35 AM  Result Value Ref Range   Glucose-Capillary 72 70 - 99 mg/dL  Glucose, capillary     Status: None   Collection Time: 08/27/19 11:57 AM  Result Value Ref Range   Glucose-Capillary 70 70 - 99 mg/dL    Recent Results (from the past 240 hour(s))  SARS Coronavirus 2 by RT PCR (hospital order, performed in [facility] hospital lab) Nasopharyngeal Nasopharyngeal Swab     Status: None   Collection Time: 08/25/19  5:44 AM   Specimen: Nasopharyngeal Swab  Result Value Ref Range Status   SARS Coronavirus 2 NEGATIVE NEGATIVE Final    Comment: (NOTE) SARS-CoV-2 target nucleic acids are NOT DETECTED.  The SARS-CoV-2 RNA is generally detectable in upper and lower respiratory specimens during the acute phase of infection. The  lowest concentration of SARS-CoV-2 viral copies this assay can detect is 250 copies / mL. A negative result does not preclude SARS-CoV-2 infection and should not be used as the sole basis for treatment or other patient management decisions.  A negative result may occur with improper specimen collection / handling, submission of specimen other than nasopharyngeal swab, presence of viral mutation(s) within the areas targeted by this assay, and inadequate number of viral copies (<250 copies / mL). A negative result must be combined with clinical observations, patient history, and epidemiological information.  Fact Sheet for Patients:   StrictlyIdeas.no  Fact Sheet for Healthcare Providers: BankingDealers.co.za  This test is not yet approved or  cleared by the [place] FDA and has been authorized for detection and/or diagnosis of SARS-CoV-2 by FDA under an Emergency Use Authorization (EUA).  This EUA will remain in effect (meaning this test can be used) for the duration of the COVID-19 declaration under Section 564(b)(1) of the Act, 21 U.S.C. section 360bbb-3(b)(1), unless the authorization is terminated or revoked sooner.  Performed at [facility], [place] [address] [place], [place] [address]   MRSA PCR Screening     Status: Abnormal   Collection Time: 08/25/19  7:31 PM   Specimen: Nasopharyngeal  Result Value Ref Range Status   MRSA by PCR POSITIVE (A) NEGATIVE Final    Comment:        The GeneXpert MRSA Assay (FDA approved for NASAL specimens only), is one component of a comprehensive MRSA colonization surveillance program. It is not intended to diagnose MRSA infection nor to guide or monitor treatment for MRSA infections. RESULT CALLED TO, READ BACK BY AND VERIFIED WITH: CHIP, EMILY @ 2229 08/26/2019 Rikki Spearing. Performed at [facility] [place] [address], [place], [place] [address]       Radiology Studies: CT ABDOMEN PELVIS W CONTRAST  Result Date: 08/25/2019 CLINICAL DATA:  Epigastric pain. History of pancreatitis. Elevated lipase. EXAM: CT ABDOMEN AND PELVIS WITH CONTRAST TECHNIQUE: Multidetector CT  imaging of the abdomen and pelvis was performed using the standard protocol following bolus administration of intravenous contrast. CONTRAST:  150m OMNIPAQUE IOHEXOL 300 MG/ML  SOLN COMPARISON:  CT 09/18/2017 FINDINGS: Lower chest: No consolidation or pleural fluid. Hepatobiliary: Mild diffuse steatosis without evidence of focal lesion. Physiologically distended gallbladder. No calcified gallstone. Common bile duct is not well-defined but no evidence of biliary dilatation. Pancreas: Diffuse peripancreatic edema, fat stranding, and free fluid about the entirety of the pancreas and retroperitoneum. Homogeneous pancreatic enhancement without evidence of necrosis. Non organized free fluid tracks into the upper abdomen, right pericolic gutter car lesser sac and retroperitoneum without organized fluid collection. No soft tissue air. Splenic vein is patent. Spleen: Normal in size without focal abnormality. Adrenals/Urinary Tract: Normal adrenal glands. No hydronephrosis or perinephric edema. There is slight prominence of the right renal collecting system and ureter which is likely reactive. No obstructive change. Homogeneous renal enhancement with symmetric excretion on delayed phase imaging. Urinary bladder is physiologically distended without wall thickening. Stomach/Bowel: Intraluminal fluid in the stomach. Mild mucosal enhancement involving the peri pyloric stomach and duodenum without obvious wall thickening, likely reactive. There is no small bowel dilatation, inflammation or obstruction. High-riding cecum in the right mid abdomen. Normal appendix courses in the right lower quadrant. Small to moderate volume of colonic stool. There is no colonic wall thickening or inflammation.  Vascular/Lymphatic: Normal caliber abdominal aorta. Patent portal and splenic veins. No acute vascular findings. No abdominopelvic adenopathy. Reproductive: Prostate is unremarkable. Other: Inflammatory changes stranding and non organized free fluid in the upper abdomen related to pancreatic inflammation. Free fluid tracks into the upper abdomen, right upper quadrant, right pericolic gutter, retroperitoneum, and lesser sac. Small amount of free fluid is tract into the pelvis. No organized fluid collection. No free air. Tiny fat containing umbilical hernia. Musculoskeletal: Chronic bilateral L5 pars interarticularis defects. No significant listhesis. There are no acute or suspicious osseous abnormalities. IMPRESSION: 1. Acute edematous pancreatitis. Significant peripancreatic inflammatory changes. No evidence of pancreatic necrosis or acute peripancreatic fluid collection. 2. Mild hepatic steatosis. 3. Chronic bilateral L5 pars interarticularis defects without significant listhesis. Electronically Signed   By: MKeith RakeM.D.   On: 08/25/2019 16:34   DG Abd Portable 2V  Result Date: 08/26/2019 CLINICAL DATA:  Abdominal pain.  Pancreatitis. EXAM: PORTABLE ABDOMEN - 2 VIEW COMPARISON:  CT abdomen and pelvis August 25, 2019 FINDINGS: Supine and upright images were obtained. There is moderate stool in the colon. There is no bowel dilatation or air-fluid level to suggest bowel obstruction. No free air. Lung bases are clear. IMPRESSION: Moderate stool in colon. No bowel obstruction or free air. Lung bases are clear. Electronically Signed   By: WLowella GripIII M.D.   On: 08/26/2019 12:19   ECHOCARDIOGRAM COMPLETE  Result Date: 08/26/2019    ECHOCARDIOGRAM REPORT   Patient Name:   TNathanal Hermiz Date of Exam: 08/26/2019 Medical Rec #:  [id number]              Height:       72.0 in Accession #:    [id number]             Weight:       260.0 lb Date of Birth:  [birth year]              BSA:           2.382 m Patient Age:    89years  BP:           137/76 mmHg Patient Gender: M                       HR:           109 bpm. Exam Location:  Inpatient Procedure: 2D Echo Indications:    Abnormal ECG R94.31  History:        Patient has no prior history of Echocardiogram examinations.                 Risk Factors:Hypertension and Diabetes.  Sonographer:    Mikki Santee RDCS (AE) Referring Phys: [place]  1. Abnormal septal motion . Left ventricular ejection fraction, by estimation, is 50 to 55%. The left ventricle has low normal function. The left ventricle has no regional wall motion abnormalities. There is mild left ventricular hypertrophy. Left ventricular diastolic parameters are consistent with Grade I diastolic dysfunction (impaired relaxation).  2. Right ventricular systolic function is normal. The right ventricular size is normal.  3. The mitral valve is normal in structure. No evidence of mitral valve regurgitation. No evidence of mitral stenosis.  4. The aortic valve is tricuspid. Aortic valve regurgitation is not visualized. No aortic stenosis is present.  5. The inferior vena cava is normal in size with greater than 50% respiratory variability, suggesting right atrial pressure of 3 mmHg. FINDINGS  Left Ventricle: Abnormal septal motion. Left ventricular ejection fraction, by estimation, is 50 to 55%. The left ventricle has low normal function. The left ventricle has no regional wall motion abnormalities. The left ventricular internal cavity size was normal in size. There is mild left ventricular hypertrophy. Left ventricular diastolic parameters are consistent with Grade I diastolic dysfunction (impaired relaxation). Right Ventricle: The right ventricular size is normal. No increase in right ventricular wall thickness. Right ventricular systolic function is normal. Left Atrium: Left atrial size was normal in size. Right Atrium: Right atrial size was normal in size.  Pericardium: There is no evidence of pericardial effusion. Mitral Valve: The mitral valve is normal in structure. Normal mobility of the mitral valve leaflets. No evidence of mitral valve regurgitation. No evidence of mitral valve stenosis. Tricuspid Valve: The tricuspid valve is normal in structure. Tricuspid valve regurgitation is not demonstrated. No evidence of tricuspid stenosis. Aortic Valve: The aortic valve is tricuspid. Aortic valve regurgitation is not visualized. No aortic stenosis is present. Pulmonic Valve: The pulmonic valve was normal in structure. Pulmonic valve regurgitation is not visualized. No evidence of pulmonic stenosis. Aorta: The aortic root is normal in size and structure. Venous: The inferior vena cava is normal in size with greater than 50% respiratory variability, suggesting right atrial pressure of 3 mmHg. IAS/Shunts: No atrial level shunt detected by color flow Doppler.  LEFT VENTRICLE PLAX 2D LVIDd:         4.20 cm  Diastology LVIDs:         2.80 cm  LV e' lateral:   9.68 cm/s LV PW:         1.50 cm  LV E/e' lateral: 5.7 LV IVS:        1.20 cm  LV e' medial:    8.92 cm/s LVOT diam:     2.50 cm  LV E/e' medial:  6.2 LV SV:         61 LV SV Index:   26 LVOT Area:     4.91 cm  RIGHT VENTRICLE RV S prime:  26.10 cm/s TAPSE (M-mode): 2.0 cm LEFT ATRIUM             Index      RIGHT ATRIUM           Index LA diam:        3.20 cm 1.34 cm/m RA Area:     11.80 cm LA Vol (A2C):   22.1 ml 9.28 ml/m RA Volume:   22.80 ml  9.57 ml/m LA Vol (A4C):   18.3 ml 7.68 ml/m LA Biplane Vol: 21.2 ml 8.90 ml/m  AORTIC VALVE LVOT Vmax:   77.70 cm/s LVOT Vmean:  50.100 cm/s LVOT VTI:    0.125 m  AORTA Ao Root diam: 3.20 cm MITRAL VALVE MV Area (PHT): 3.42 cm    SHUNTS MV Decel Time: 222 msec    Systemic VTI:  0.12 m MV E velocity: 55.20 cm/s  Systemic Diam: 2.50 cm MV A velocity: 62.70 cm/s MV E/A ratio:  0.88 Jenkins Rouge MD Electronically signed by Jenkins Rouge MD Signature Date/Time:  08/26/2019/3:16:03 PM    Final    [place] EKG SITE RITE  Result Date: 08/26/2019 If Site Rite image not attached, placement could not be confirmed due to current cardiac rhythm.  DG Abd Portable 2V  Final Result    [place] EKG SITE RITE  Final Result    CT ABDOMEN PELVIS W CONTRAST  Final Result    DG Chest Portable 1 View  Final Result       Scheduled Meds:  amiodarone  150 mg Intravenous Once   Chlorhexidine Gluconate Cloth  6 each Topical Q0600   icosapent Ethyl  2 g Oral BID   metoprolol tartrate  25 mg Oral BID   mupirocin ointment  1 application Nasal BID   sodium chloride flush  10-40 mL Intracatheter Q12H   PRN Meds: dextrose, hydrALAZINE, HYDROmorphone (DILAUDID) injection, labetalol, metoprolol tartrate, ondansetron (ZOFRAN) IV, prochlorperazine, sodium chloride flush Continuous Infusions:  sodium chloride 75 mL/hr at 08/27/19 0801   amiodarone     Followed by   amiodarone     dextrose 125 mL/hr at 08/27/19 1202   insulin 0.1 Units/kg/hr (08/27/19 1245)      LOS: 2 days  Time spent: Greater than 50% of the 35 minute visit was spent in counseling/coordination of care for the patient as laid out in the A&P.   Dwyane Dee, MD [facility] 08/27/2019, 1:09 PM   Contact via secure chat.  To contact the attending provider between 7A-7P or the covering provider during after hours 7P-7A, please log into the web site [email or web address] and access using universal [place] password for that web site. If you do not have the password, please call the hospital operator.

## 2019-08-27 NOTE — Progress Notes (Signed)
Pt HR accelerated to 150s sustained. RN gave labetalol 10mg  IV which was ineffective. [facility] NP ordered Diltiazem 10mg  IV. HR now in 130s. STAT EKG shows new onset Afib/Aflutter (placed in chart). No additional orders at this time. RN will continue to monitor

## 2019-08-28 ENCOUNTER — Inpatient Hospital Stay (HOSPITAL_COMMUNITY): Payer: 59

## 2019-08-28 DIAGNOSIS — E781 Pure hyperglyceridemia: Secondary | ICD-10-CM

## 2019-08-28 DIAGNOSIS — I10 Essential (primary) hypertension: Secondary | ICD-10-CM

## 2019-08-28 LAB — COMPREHENSIVE METABOLIC PANEL
ALT: 22 U/L (ref 0–44)
ALT: 24 U/L (ref 0–44)
ALT: 23 U/L (ref 0–44)
ALT: 25 U/L (ref 0–44)
ALT: 22 U/L (ref 0–44)
ALT: 22 U/L (ref 0–44)
ALT: 23 U/L (ref 0–44)
AST: 37 U/L (ref 15–41)
AST: 40 U/L (ref 15–41)
AST: 41 U/L (ref 15–41)
AST: 41 U/L (ref 15–41)
AST: 41 U/L (ref 15–41)
AST: 38 U/L (ref 15–41)
AST: 38 U/L (ref 15–41)
Albumin: 2.3 g/dL — ABNORMAL LOW (ref 3.5–5.0)
Albumin: 2.9 g/dL — ABNORMAL LOW (ref 3.5–5.0)
Albumin: 2.4 g/dL — ABNORMAL LOW (ref 3.5–5.0)
Albumin: 2.6 g/dL — ABNORMAL LOW (ref 3.5–5.0)
Albumin: 2.4 g/dL — ABNORMAL LOW (ref 3.5–5.0)
Albumin: 2.7 g/dL — ABNORMAL LOW (ref 3.5–5.0)
Albumin: 2.7 g/dL — ABNORMAL LOW (ref 3.5–5.0)
Alkaline Phosphatase: 48 U/L (ref 38–126)
Alkaline Phosphatase: 55 U/L (ref 38–126)
Alkaline Phosphatase: 50 U/L (ref 38–126)
Alkaline Phosphatase: 60 U/L (ref 38–126)
Alkaline Phosphatase: 55 U/L (ref 38–126)
Alkaline Phosphatase: 59 U/L (ref 38–126)
Alkaline Phosphatase: 64 U/L (ref 38–126)
Anion gap: 15 (ref 5–15)
Anion gap: 8 (ref 5–15)
Anion gap: 10 (ref 5–15)
Anion gap: 13 (ref 5–15)
Anion gap: 14 (ref 5–15)
Anion gap: 10 (ref 5–15)
Anion gap: 11 (ref 5–15)
BUN: 13 mg/dL (ref 6–20)
BUN: 13 mg/dL (ref 6–20)
BUN: 12 mg/dL (ref 6–20)
BUN: 13 mg/dL (ref 6–20)
BUN: 12 mg/dL (ref 6–20)
BUN: 11 mg/dL (ref 6–20)
BUN: 11 mg/dL (ref 6–20)
CO2: 14 mmol/L — ABNORMAL LOW (ref 22–32)
CO2: 21 mmol/L — ABNORMAL LOW (ref 22–32)
CO2: 15 mmol/L — ABNORMAL LOW (ref 22–32)
CO2: 21 mmol/L — ABNORMAL LOW (ref 22–32)
CO2: 15 mmol/L — ABNORMAL LOW (ref 22–32)
CO2: 20 mmol/L — ABNORMAL LOW (ref 22–32)
CO2: 20 mmol/L — ABNORMAL LOW (ref 22–32)
Calcium: 8 mg/dL — ABNORMAL LOW (ref 8.9–10.3)
Calcium: 8.1 mg/dL — ABNORMAL LOW (ref 8.9–10.3)
Calcium: 8.2 mg/dL — ABNORMAL LOW (ref 8.9–10.3)
Calcium: 8.2 mg/dL — ABNORMAL LOW (ref 8.9–10.3)
Calcium: 8.2 mg/dL — ABNORMAL LOW (ref 8.9–10.3)
Calcium: 8.1 mg/dL — ABNORMAL LOW (ref 8.9–10.3)
Calcium: 8.5 mg/dL — ABNORMAL LOW (ref 8.9–10.3)
Chloride: 99 mmol/L (ref 98–111)
Chloride: 101 mmol/L (ref 98–111)
Chloride: 105 mmol/L (ref 98–111)
Chloride: 99 mmol/L (ref 98–111)
Chloride: 103 mmol/L (ref 98–111)
Chloride: 101 mmol/L (ref 98–111)
Chloride: 99 mmol/L (ref 98–111)
Creatinine, Ser: 1.23 mg/dL (ref 0.61–1.24)
Creatinine, Ser: 0.86 mg/dL (ref 0.61–1.24)
Creatinine, Ser: 0.93 mg/dL (ref 0.61–1.24)
Creatinine, Ser: 1.13 mg/dL (ref 0.61–1.24)
Creatinine, Ser: 0.98 mg/dL (ref 0.61–1.24)
Creatinine, Ser: 0.82 mg/dL (ref 0.61–1.24)
Creatinine, Ser: 0.84 mg/dL (ref 0.61–1.24)
GFR calc Af Amer: >60 mL/min (ref >60)
GFR calc Af Amer: >60 mL/min (ref >60)
GFR calc Af Amer: >60 mL/min (ref >60)
GFR calc Af Amer: >60 mL/min (ref >60)
GFR calc Af Amer: >60 mL/min (ref >60)
GFR calc Af Amer: >60 mL/min (ref >60)
GFR calc Af Amer: >60 mL/min (ref >60)
GFR calc non Af Amer: >60 mL/min (ref >60)
GFR calc non Af Amer: >60 mL/min (ref >60)
GFR calc non Af Amer: >60 mL/min (ref >60)
GFR calc non Af Amer: >60 mL/min (ref >60)
GFR calc non Af Amer: >60 mL/min (ref >60)
GFR calc non Af Amer: >60 mL/min (ref >60)
GFR calc non Af Amer: >60 mL/min (ref >60)
Glucose, Bld: 181 mg/dL — ABNORMAL HIGH (ref 70–99)
Glucose, Bld: 126 mg/dL — ABNORMAL HIGH (ref 70–99)
Glucose, Bld: 131 mg/dL — ABNORMAL HIGH (ref 70–99)
Glucose, Bld: 60 mg/dL — ABNORMAL LOW (ref 70–99)
Glucose, Bld: 86 mg/dL (ref 70–99)
Glucose, Bld: 137 mg/dL — ABNORMAL HIGH (ref 70–99)
Glucose, Bld: 137 mg/dL — ABNORMAL HIGH (ref 70–99)
Potassium: 3.3 mmol/L — ABNORMAL LOW (ref 3.5–5.1)
Potassium: 3.1 mmol/L — ABNORMAL LOW (ref 3.5–5.1)
Potassium: 3.1 mmol/L — ABNORMAL LOW (ref 3.5–5.1)
Potassium: 3.5 mmol/L (ref 3.5–5.1)
Potassium: 3.4 mmol/L — ABNORMAL LOW (ref 3.5–5.1)
Potassium: 3.7 mmol/L (ref 3.5–5.1)
Potassium: 3.1 mmol/L — ABNORMAL LOW (ref 3.5–5.1)
Sodium: 128 mmol/L — ABNORMAL LOW (ref 135–145)
Sodium: 130 mmol/L — ABNORMAL LOW (ref 135–145)
Sodium: 130 mmol/L — ABNORMAL LOW (ref 135–145)
Sodium: 133 mmol/L — ABNORMAL LOW (ref 135–145)
Sodium: 132 mmol/L — ABNORMAL LOW (ref 135–145)
Sodium: 131 mmol/L — ABNORMAL LOW (ref 135–145)
Sodium: 130 mmol/L — ABNORMAL LOW (ref 135–145)
Total Bilirubin: 1 mg/dL (ref 0.3–1.2)
Total Bilirubin: 0.7 mg/dL (ref 0.3–1.2)
Total Bilirubin: 0.7 mg/dL (ref 0.3–1.2)
Total Bilirubin: 1 mg/dL (ref 0.3–1.2)
Total Bilirubin: 0.9 mg/dL (ref 0.3–1.2)
Total Bilirubin: 0.8 mg/dL (ref 0.3–1.2)
Total Bilirubin: 0.7 mg/dL (ref 0.3–1.2)
Total Protein: 6 g/dL — ABNORMAL LOW (ref 6.5–8.1)
Total Protein: 6.3 g/dL — ABNORMAL LOW (ref 6.5–8.1)
Total Protein: 6 g/dL — ABNORMAL LOW (ref 6.5–8.1)
Total Protein: 6.3 g/dL — ABNORMAL LOW (ref 6.5–8.1)
Total Protein: 6.3 g/dL — ABNORMAL LOW (ref 6.5–8.1)
Total Protein: 6.5 g/dL (ref 6.5–8.1)
Total Protein: 6.5 g/dL (ref 6.5–8.1)

## 2019-08-28 LAB — GLUCOSE, CAPILLARY
Glucose-Capillary: 109 mg/dL — ABNORMAL HIGH (ref 70–99)
Glucose-Capillary: 109 mg/dL — ABNORMAL HIGH (ref 70–99)
Glucose-Capillary: 111 mg/dL — ABNORMAL HIGH (ref 70–99)
Glucose-Capillary: 117 mg/dL — ABNORMAL HIGH (ref 70–99)
Glucose-Capillary: 118 mg/dL — ABNORMAL HIGH (ref 70–99)
Glucose-Capillary: 120 mg/dL — ABNORMAL HIGH (ref 70–99)
Glucose-Capillary: 122 mg/dL — ABNORMAL HIGH (ref 70–99)
Glucose-Capillary: 132 mg/dL — ABNORMAL HIGH (ref 70–99)
Glucose-Capillary: 134 mg/dL — ABNORMAL HIGH (ref 70–99)
Glucose-Capillary: 139 mg/dL — ABNORMAL HIGH (ref 70–99)
Glucose-Capillary: 141 mg/dL — ABNORMAL HIGH (ref 70–99)
Glucose-Capillary: 146 mg/dL — ABNORMAL HIGH (ref 70–99)
Glucose-Capillary: 146 mg/dL — ABNORMAL HIGH (ref 70–99)
Glucose-Capillary: 158 mg/dL — ABNORMAL HIGH (ref 70–99)
Glucose-Capillary: 162 mg/dL — ABNORMAL HIGH (ref 70–99)
Glucose-Capillary: 162 mg/dL — ABNORMAL HIGH (ref 70–99)
Glucose-Capillary: 185 mg/dL — ABNORMAL HIGH (ref 70–99)
Glucose-Capillary: 185 mg/dL — ABNORMAL HIGH (ref 70–99)
Glucose-Capillary: 62 mg/dL — ABNORMAL LOW (ref 70–99)
Glucose-Capillary: 63 mg/dL — ABNORMAL LOW (ref 70–99)
Glucose-Capillary: 77 mg/dL (ref 70–99)
Glucose-Capillary: 85 mg/dL (ref 70–99)
Glucose-Capillary: 86 mg/dL (ref 70–99)
Glucose-Capillary: 87 mg/dL (ref 70–99)

## 2019-08-28 LAB — CBC WITH DIFFERENTIAL/PLATELET
Abs Immature Granulocytes: 0.08 10*3/uL — ABNORMAL HIGH (ref 0.00–0.07)
Basophils Absolute: 0 10*3/uL (ref 0.0–0.1)
Basophils Relative: 1 %
Eosinophils Absolute: 0 10*3/uL (ref 0.0–0.5)
Eosinophils Relative: 1 %
HCT: 39.8 % (ref 39.0–52.0)
Hemoglobin: 12.8 g/dL — ABNORMAL LOW (ref 13.0–17.0)
Immature Granulocytes: 1 %
Lymphocytes Relative: 17 %
Lymphs Abs: 1.3 10*3/uL (ref 0.7–4.0)
MCH: 28.8 pg (ref 26.0–34.0)
MCHC: 32.2 g/dL (ref 30.0–36.0)
MCV: 89.4 fL (ref 80.0–100.0)
Monocytes Absolute: 0.7 10*3/uL (ref 0.1–1.0)
Monocytes Relative: 9 %
Neutro Abs: 5.5 10*3/uL (ref 1.7–7.7)
Neutrophils Relative %: 71 %
Platelets: 215 10*3/uL (ref 150–400)
RBC: 4.45 MIL/uL (ref 4.22–5.81)
RDW: 14.5 % (ref 11.5–15.5)
WBC: 7.6 10*3/uL (ref 4.0–10.5)
nRBC: 0 % (ref 0.0–0.2)

## 2019-08-28 LAB — LIPID PANEL
Cholesterol: 509 mg/dL — ABNORMAL HIGH (ref 0–200)
Cholesterol: 463 mg/dL — ABNORMAL HIGH (ref 0–200)
HDL: 21 mg/dL — ABNORMAL LOW (ref >40)
HDL: 21 mg/dL — ABNORMAL LOW (ref >40)
LDL Cholesterol: UNDETERMINED mg/dL (ref 0–99)
LDL Cholesterol: UNDETERMINED mg/dL (ref 0–99)
Total CHOL/HDL Ratio: 22 RATIO
Triglycerides: 1567 mg/dL — ABNORMAL HIGH (ref <150)
Triglycerides: 875 mg/dL — ABNORMAL HIGH (ref <150)
VLDL: UNDETERMINED mg/dL (ref 0–40)
VLDL: UNDETERMINED mg/dL (ref 0–40)

## 2019-08-28 LAB — HEMOGLOBIN A1C
Hgb A1c MFr Bld: >15.5 % — ABNORMAL HIGH (ref 4.8–5.6)
Mean Plasma Glucose: >398 mg/dL

## 2019-08-28 LAB — PHOSPHORUS: Phosphorus: 2.4 mg/dL — ABNORMAL LOW (ref 2.5–4.6)

## 2019-08-28 LAB — LDL CHOLESTEROL, DIRECT
Direct LDL: 132.9 mg/dL — ABNORMAL HIGH (ref 0–99)
Direct LDL: 141.1 mg/dL — ABNORMAL HIGH (ref 0–99)

## 2019-08-28 LAB — LIPASE, BLOOD: Lipase: 169 U/L — ABNORMAL HIGH (ref 11–51)

## 2019-08-28 LAB — MAGNESIUM: Magnesium: 2 mg/dL (ref 1.7–2.4)

## 2019-08-28 MED ORDER — KETOROLAC TROMETHAMINE 15 MG/ML IJ SOLN
15.0000 mg | Freq: Four times a day (QID) | INTRAMUSCULAR | Status: DC | PRN
  Administered 2019-08-28 – 2019-08-31 (×12): 15 mg via INTRAVENOUS
  Filled 2019-08-28 (×13): qty 1

## 2019-08-28 MED ORDER — BISACODYL 10 MG RE SUPP
10.0000 mg | Freq: Every day | RECTAL | Status: DC
  Administered 2019-08-28 – 2019-08-29 (×2): 10 mg via RECTAL
  Filled 2019-08-28 (×5): qty 1

## 2019-08-28 MED ORDER — POTASSIUM PHOSPHATES 15 MMOLE/5ML IV SOLN
30.0000 mmol | Freq: Once | INTRAVENOUS | Status: AC
  Administered 2019-08-28: 30 mmol via INTRAVENOUS
  Filled 2019-08-28: qty 10

## 2019-08-28 MED ORDER — METOPROLOL SUCCINATE ER 25 MG PO TB24
50.0000 mg | ORAL_TABLET | Freq: Every day | ORAL | Status: DC
  Administered 2019-08-29 – 2019-09-01 (×4): 50 mg via ORAL
  Filled 2019-08-28 (×4): qty 2

## 2019-08-28 NOTE — Progress Notes (Signed)
Patient's last CBG was 63.  Night provider Linton Flemings was notified of low CGB.  Night provider called with instructions to reduce insulin gtt to 4 units/hr.

## 2019-08-28 NOTE — Progress Notes (Addendum)
Progress Note  Patient Name: Douglas [name]. Date of Encounter: 08/28/2019  Primary Cardiologist: Reatha Harps, MD   Subjective   Denies any chest pain, SOB or palpitations  Inpatient Medications    Scheduled Meds: . bisacodyl  10 mg Rectal Daily  . Chlorhexidine Gluconate Cloth  6 each Topical Q0600  . icosapent Ethyl  2 g Oral BID  . metoprolol tartrate  25 mg Oral BID  . mupirocin ointment  1 application Nasal BID  . sodium chloride flush  10-40 mL Intracatheter Q12H   Continuous Infusions: . sodium chloride 75 mL/hr at 08/28/19 1047  . dextrose 75 mL/hr at 08/28/19 1047  . insulin 8 Units/hr (08/28/19 1047)  . potassium PHOSPHATE IVPB (in mmol) 85 mL/hr at 08/28/19 1047   PRN Meds: dextrose, hydrALAZINE, HYDROmorphone (DILAUDID) injection, ketorolac, labetalol, metoprolol tartrate, ondansetron (ZOFRAN) IV, prochlorperazine, sodium chloride flush   Vital Signs    Vitals:   08/28/19 0400 08/28/19 0800 08/28/19 1000 08/28/19 1200  BP: 125/73 (!) 117/56 138/61 105/61  Pulse: 88 92 95 87  Resp: 17 (!) 22 17 18   Temp: 98.9 F (37.2 C) 99.5 F (37.5 C)  (!) 100.9 F (38.3 C)  TempSrc: Oral Oral  Oral  SpO2: 100% 99% 100% 98%  Weight:      Height:        Intake/Output Summary (Last 24 hours) at 08/28/2019 1441 Last data filed at 08/28/2019 1047 Gross per 24 hour  Intake 4606.1 ml  Output 425 ml  Net 4181.1 ml   Filed Weights   08/25/19 0453 08/25/19 0529  Weight: 118.8 kg 117.9 kg    Telemetry    NSR - Personally Reviewed  ECG    No new EKG to review - Personally Reviewed  Physical Exam   GEN: No acute distress.   Neck: No JVD Cardiac: RRR, no murmurs, rubs, or gallops.  Respiratory: Clear to auscultation bilaterally. GI: Soft, nontender, non-distended  MS: No edema; No deformity. Neuro:  Nonfocal  Psych: Normal affect   Labs    Chemistry Recent Labs  Lab 08/28/19 0435 08/28/19 0927 08/28/19 1213  NA 128* 130* 130*  K 3.3*  3.1* 3.5  CL 99 101 99  CO2 14* 21* 21*  GLUCOSE 181* 126* 131*  BUN 13 13 12   CREATININE 1.23 0.86 0.93  CALCIUM 8.0* 8.1* 8.2*  PROT 6.0* 6.3* 6.3*  ALBUMIN 2.3* 2.9* 2.6*  AST 37 40 41  ALT 22 24 25   ALKPHOS 48 55 60  BILITOT 1.0 0.7 0.7  GFRNONAA >60 >60 >60  GFRAA >60 >60 >60  ANIONGAP 15 8 10      Hematology Recent Labs  Lab 08/26/19 0249 08/27/19 0336 08/28/19 0436  WBC 3.3* 6.4 7.6  RBC 6.12* 5.38 4.45  HGB 17.8* 15.4 12.8*  HCT 53.6* 47.2 39.8  MCV 87.6 87.7 89.4  MCH 29.1 28.6 28.8  MCHC 33.2 32.6 32.2  RDW 14.1 14.3 14.5  PLT 261 246 215    Cardiac EnzymesNo results for input(s): TROPONINI in the last 168 hours. No results for input(s): TROPIPOC in the last 168 hours.   BNPNo results for input(s): BNP, PROBNP in the last 168 hours.   DDimer No results for input(s): DDIMER in the last 168 hours.   Radiology    DG Abd Portable 2V  Result Date: 08/28/2019 CLINICAL DATA:  Diffuse abdominal pain. EXAM: PORTABLE ABDOMEN - 2 VIEW COMPARISON:  07/28/2019 FINDINGS: Air-filled small bowel loops and scattered air-fluid levels.  No significant distension. There is also moderate air and stool throughout the colon and down into the rectosigmoid area. Findings suggest constipation and small-bowel ileus related to the patient's pancreatitis. No findings to suggest obstruction or free air. IMPRESSION: Ileus bowel gas pattern and probable constipation. No findings to suggest small bowel obstruction or free air. Electronically Signed   By: Rudie Meyer M.D.   On: 08/28/2019 10:18   DG Abd Portable 2V  Result Date: 08/27/2019 CLINICAL DATA:  Pain EXAM: PORTABLE ABDOMEN - 2 VIEW COMPARISON:  August 26, 2019 FINDINGS: The bowel gas pattern is normal. There is no evidence of free air. No radio-opaque calculi or other significant radiographic abnormality is seen. There is a moderate amount of stool. IMPRESSION: Nonobstructive bowel gas pattern.  Moderate stool burden. Electronically  Signed   By: Katherine Mantle M.D.   On: 08/27/2019 15:44   ECHOCARDIOGRAM COMPLETE  Result Date: 08/26/2019    ECHOCARDIOGRAM REPORT   Patient Name:   Douglas [name]. Date of Exam: 08/26/2019 Medical Rec #:  [id number]               Height:       72.0 in Accession #:    [id number]              Weight:       260.0 lb Date of Birth:  [birth year]               BSA:          2.382 m Patient Age:    34 years                BP:           137/76 mmHg Patient Gender: M                       HR:           109 bpm. Exam Location:  Inpatient Procedure: 2D Echo Indications:    Abnormal ECG R94.31  History:        Patient has no prior history of Echocardiogram examinations.                 Risk Factors:Hypertension and Diabetes.  Sonographer:    Thurman Coyer RDCS (AE) Referring Phys: [phone] DAVID GIRGUIS IMPRESSIONS  1. Abnormal septal motion . Left ventricular ejection fraction, by estimation, is 50 to 55%. The left ventricle has low normal function. The left ventricle has no regional wall motion abnormalities. There is mild left ventricular hypertrophy. Left ventricular diastolic parameters are consistent with Grade I diastolic dysfunction (impaired relaxation).  2. Right ventricular systolic function is normal. The right ventricular size is normal.  3. The mitral valve is normal in structure. No evidence of mitral valve regurgitation. No evidence of mitral stenosis.  4. The aortic valve is tricuspid. Aortic valve regurgitation is not visualized. No aortic stenosis is present.  5. The inferior vena cava is normal in size with greater than 50% respiratory variability, suggesting right atrial pressure of 3 mmHg. FINDINGS  Left Ventricle: Abnormal septal motion. Left ventricular ejection fraction, by estimation, is 50 to 55%. The left ventricle has low normal function. The left ventricle has no regional wall motion abnormalities. The left ventricular internal cavity size was normal in size. There is mild left  ventricular hypertrophy. Left ventricular diastolic parameters are consistent with Grade I diastolic dysfunction (impaired relaxation). Right Ventricle: The right ventricular size is normal.  No increase in right ventricular wall thickness. Right ventricular systolic function is normal. Left Atrium: Left atrial size was normal in size. Right Atrium: Right atrial size was normal in size. Pericardium: There is no evidence of pericardial effusion. Mitral Valve: The mitral valve is normal in structure. Normal mobility of the mitral valve leaflets. No evidence of mitral valve regurgitation. No evidence of mitral valve stenosis. Tricuspid Valve: The tricuspid valve is normal in structure. Tricuspid valve regurgitation is not demonstrated. No evidence of tricuspid stenosis. Aortic Valve: The aortic valve is tricuspid. Aortic valve regurgitation is not visualized. No aortic stenosis is present. Pulmonic Valve: The pulmonic valve was normal in structure. Pulmonic valve regurgitation is not visualized. No evidence of pulmonic stenosis. Aorta: The aortic root is normal in size and structure. Venous: The inferior vena cava is normal in size with greater than 50% respiratory variability, suggesting right atrial pressure of 3 mmHg. IAS/Shunts: No atrial level shunt detected by color flow Doppler.  LEFT VENTRICLE PLAX 2D LVIDd:         4.20 cm  Diastology LVIDs:         2.80 cm  LV e' lateral:   9.68 cm/s LV PW:         1.50 cm  LV E/e' lateral: 5.7 LV IVS:        1.20 cm  LV e' medial:    8.92 cm/s LVOT diam:     2.50 cm  LV E/e' medial:  6.2 LV SV:         61 LV SV Index:   26 LVOT Area:     4.91 cm  RIGHT VENTRICLE RV S prime:     26.10 cm/s TAPSE (M-mode): 2.0 cm LEFT ATRIUM             Index      RIGHT ATRIUM           Index LA diam:        3.20 cm 1.34 cm/m RA Area:     11.80 cm LA Vol (A2C):   22.1 ml 9.28 ml/m RA Volume:   22.80 ml  9.57 ml/m LA Vol (A4C):   18.3 ml 7.68 ml/m LA Biplane Vol: 21.2 ml 8.90 ml/m  AORTIC  VALVE LVOT Vmax:   77.70 cm/s LVOT Vmean:  50.100 cm/s LVOT VTI:    0.125 m  AORTA Ao Root diam: 3.20 cm MITRAL VALVE MV Area (PHT): 3.42 cm    SHUNTS MV Decel Time: 222 msec    Systemic VTI:  0.12 m MV E velocity: 55.20 cm/s  Systemic Diam: 2.50 cm MV A velocity: 62.70 cm/s MV E/A ratio:  0.88 Charlton HawsPeter Nishan MD Electronically signed by Charlton HawsPeter Nishan MD Signature Date/Time: 08/26/2019/3:16:03 PM    Final     Cardiac Studies   08/26/2019 2D echo IMPRESSIONS   1. Abnormal septal motion . Left ventricular ejection fraction, by  estimation, is 50 to 55%. The left ventricle has low normal function. The  left ventricle has no regional wall motion abnormalities. There is mild  left ventricular hypertrophy. Left  ventricular diastolic parameters are consistent with Grade I diastolic  dysfunction (impaired relaxation).  2. Right ventricular systolic function is normal. The right ventricular  size is normal.  3. The mitral valve is normal in structure. No evidence of mitral valve  regurgitation. No evidence of mitral stenosis.  4. The aortic valve is tricuspid. Aortic valve regurgitation is not  visualized. No aortic stenosis is present.  5.  The inferior vena cava is normal in size with greater than 50%  respiratory variability, suggesting right atrial pressure of 3 mmHg.   Patient Profile     34 y.o. male with a hx of DM, HTN, HLD, pancreatitis, obesity, who is being seen for the evaluation of Afib at the request of Dr Frederick Peers.  Assessment & Plan    1.  Atrial fibrillation new onset: -He developed atrial fibrillation in the setting of critical illness including hypertriglyceridemia induced pancreatitis and diabetic ketoacidosis.   -Echocardiogram is normal with normal EF and normal left atrial size.  No significant valvular heart disease.  - His EKG demonstrates LVH.   -He spontaneously converted back to normal sinus rhythm and has had no further recurrence.   -He was in atrial fibrillation  for 4 to 5 hours.   -Given that this is secondary atrial fibrillation in the setting of critical illness, no anticoagulation recommended at this time.  -will change Lopressor 25mg  BID to Toprol XL 50mg  daily  -Presumably he will not have recurrence of this if he is out of DKA and not critically ill in the ICU.   -he has not had any reoccurrence of arrhythmias on my review of tele in the past 24 hours  2.  Hypertriglyceridemia:  -He has rather impressive hypertriglycerides and has had this issue for a while.   -He has not seen an advanced lipid specialist. -Continue fenofibrate, Lipitor and Vascepa.   - We will arrange for him to see Dr. Hilty in the advanced lipid clinic at the [facility] office.  [facility] will sign off.   Medication Recommendations:  Vascepa 2gm BID, Fenofibrate 160mg  daily, Atorvastatin 40mg  daily and Toprol XL 50mg  daily Other recommendations (labs, testing, etc):  none Follow up as an outpatient:  outpt followup with Dr. [place] in advanced lipid clinic and [facility]. [facility] in 3-4 weeks  For questions or updates, please contact [facility] Please consult [email or web address] for contact info under Cardiology/STEMI.      Signed, , MD  08/28/2019, 2:41 PM

## 2019-08-28 NOTE — Progress Notes (Signed)
PROGRESS NOTE    Douglas [name].   YKD:[id number]  DOB: Sep 14, 1985  DOA: 08/25/2019     3  PCP: Tomasa Hose, NP  CC: N/V, abd pain  Hospital Course: Douglas [name] is a 34 year old African-American male with PMH obesity, hypertriglyceride induced pancreatitis, GERD, type 2 diabetes, hypertension, hyperlipidemia who presented to the hospital with severe abdominal pain, nausea, vomiting.  He stated the pain started around 2 AM and awoke him from sleep.  He has had ongoing nausea and vomiting throughout the day and presented for further evaluation.  In the ER he underwent work-up including CT abdomen/pelvis which revealed diffuse peripancreatic edema, fat stranding, and free fluid around the pancreas consistent with acute edematous pancreatitis.  No pancreatic necrosis appreciated or abscess. Notable labs include: WBC 6.2, Hgb 15.3, PLTC 226 Na 130, K 5.7, Co2 12, Glucose 242, BUN 10, Creat 0.96, AG 20, TB 4.9, AST 82, ALT Lipase 1035 VBG 7.23/31 Beta hydroxybutyrate 4.83  He is admitted for further treatment of DKA and hyperTG induced pancreatitis. TG initially on workup was 8,286.  He denies alcohol use.  He responded well to treatment of DKA. Care was then transitioned to focusing on treating his hyperTG induced pancreatitis. Insulin infusion was changed to fixed dosing with titrating dextrose infusions.  On the night of 08/26/19 he converted to afib with RVR and was given a cardizem bolus and persisted in afib with RVR but became hypotensive. He was asymptomatic and was started on oral lopressor.  He converted spontaneously back to NSR on 08/27/19.    Interval History:  Remaining in NSR since yesterday. Still having pain this am but is controlled. Has flatus but no BM yet; discussed constipation/stool seen on xray. No N/V and otherwise feeling better slowly each day.   Old records reviewed in assessment of this patient  ROS: Constitutional: negative for chills and  fevers, Respiratory: negative for cough, Cardiovascular: negative for chest pain and palpitations and Gastrointestinal: negative for abdominal pain  Assessment & Plan: DKA (diabetic ketoacidoses) ([place]) - resolved with treatment; d/c protocol on 7/11 - see pancreatitis   Acute pancreatitis -Patient denies alcohol use.  Meets criteria for pancreatitis in setting of lipase, CT abd/pelvis, clinical exam -Etiology is considered due to hypertriglyceridemia.  TG 8,286 initially -DKA has resolved - will transition to hyperTG pancreatitis protocol. Use insulin drip 0.1 units/hg/hr (glucose has been unable to tolerate so have lowered insulin rate for now and will try and keep fixed while titrating dextrose infusions) - start on D5W and NS; will adjust drips as necessary - continue q1h CBG checks - see hyperTG -Continue Zofran, Compazine, dilaudid -keep NPO; ice chips okay - still at risk for ileus development; BS better today; continue serial abdominal imaging and will treat constipation once able to give PO - trend lipase and lipid profile   Hypertension -Use labetalol or hydralazine as needed  Type 2 diabetes mellitus without complication ([place]) -Follow-up A1c -Patient states he takes Metformin only at home, no insulin -Will reinitiate therapy after resolution of DKA and pancreatitis  Hyperlipidemia -Last lipid panel reviewed from August 2019.  Triglycerides were over 1000 at that time -Patient takes Lipitor and fenofibrate at home.  Will max out both once he is tolerating p.o. again.  Will also need to add on fish oil 2 g twice daily -Follow-up lipid profile and trend every 12 hours while treating pancreatitis. Initial TG 8,286; continuing to downtrend; goal is TG<500 before discontinuing insulin infusion - see pancreatitis  as well   Atrial fibrillation with RVR (HCC) - no prior history of similar; etiology likely in setting of acute illness - refractory to cardizem bolus; BP hypotensive;  options would be chemical vs electrical cardioversion - cardiology consulted, appreciate assistance - had ordered heparin gtt and amiodarone drip but patient converted back to NSR morning of 08/27/19 prior to initiation of drips; therefore will hold off on starting unless converts again or per cardiology rec's - echo obtained on 7/10 prior to afib: EF 50-55%, Gr 1 DD - troponin x 1 negative on 7/10; denies any CP    Antimicrobials: N/AA  DVT prophylaxis: Lovenox Code Status: Full Family Communication: Mother via phone Disposition Plan:  . Patient came from: Home        . Barriers to d/c OR conditions which need to be met to effect a safe d/c: None . The current disposition plan is discharge to: Home Status is: Inpatient  Remains inpatient appropriate because:Persistent severe electrolyte disturbances, IV treatments appropriate due to intensity of illness or inability to take PO and Inpatient level of care appropriate due to severity of illness   Dispo: The patient is from: Home              Anticipated d/c is to: Home              Anticipated d/c date is: > 3 days              Patient currently is not medically stable to d/c.  Objective: Blood pressure 125/73, pulse 88, temperature 99.5 F (37.5 C), temperature source Oral, resp. rate 17, height 6' (1.829 m), weight 117.9 kg, SpO2 100 %.   Intake/Output Summary (Last 24 hours) at 08/28/2019 0943 Last data filed at 08/27/2019 1900 Gross per 24 hour  Intake 1870.1 ml  Output --  Net 1870.1 ml   Last Weight  Most recent update: 08/25/2019  5:30 AM   Weight  117.9 kg (260 lb)           Examination: General appearance: young adult man resting in bed appearing much more comfortable this am Head: Normocephalic, without obvious abnormality Eyes: EOMI Lungs: clear to auscultation bilaterally Heart: regular rate and rhythm and S1, S2 normal Abdomen: soft, less TTP, BS hypoactive but better than yesterday, ND Extremities: no  edema Skin: Skin color, texture, turgor normal. No rashes or lesions or mobility and turgor normal Neurologic: Grossly normal   Consultants:   Cardiology  Procedures:   n/a  Data Reviewed: I have personally reviewed following labs and imaging studies Results for orders placed or performed during the hospital encounter of 08/25/19 (from the past 24 hour(s))  Glucose, capillary     Status: Abnormal   Collection Time: 08/27/19  9:55 AM  Result Value Ref Range   Glucose-Capillary 126 (H) 70 - 99 mg/dL  Glucose, capillary     Status: None   Collection Time: 08/27/19 11:35 AM  Result Value Ref Range   Glucose-Capillary 72 70 - 99 mg/dL  Glucose, capillary     Status: None   Collection Time: 08/27/19 11:57 AM  Result Value Ref Range   Glucose-Capillary 70 70 - 99 mg/dL  Glucose, capillary     Status: None   Collection Time: 08/27/19  1:28 PM  Result Value Ref Range   Glucose-Capillary 77 70 - 99 mg/dL  Glucose, capillary     Status: None   Collection Time: 08/27/19  2:43 PM  Result Value Ref Range  Glucose-Capillary 75 70 - 99 mg/dL  Glucose, capillary     Status: Abnormal   Collection Time: 08/27/19  3:38 PM  Result Value Ref Range   Glucose-Capillary 67 (L) 70 - 99 mg/dL  Lipid panel     Status: Abnormal   Collection Time: 08/27/19  3:52 PM  Result Value Ref Range   Cholesterol 632 (H) 0 - 200 mg/dL   Triglycerides 1,300 (H) <150 mg/dL   HDL NOT REPORTED DUE TO HIGH TRIGLYCERIDES >40 mg/dL   Total CHOL/HDL Ratio NOT REPORTED DUE TO HIGH TRIGLYCERIDES RATIO   VLDL UNABLE TO CALCULATE IF TRIGLYCERIDE OVER 400 mg/dL 0 - 40 mg/dL   LDL Cholesterol UNABLE TO CALCULATE IF TRIGLYCERIDE OVER 400 mg/dL 0 - 99 mg/dL  LDL cholesterol, direct     Status: None   Collection Time: 08/27/19  3:52 PM  Result Value Ref Range   Direct LDL NOT REPORTED DUE TO HIGH TRIGLYCERIDES 0 - 99 mg/dL  Comprehensive metabolic panel     Status: Abnormal   Collection Time: 08/27/19  4:00 PM  Result  Value Ref Range   Sodium 132 (L) 135 - 145 mmol/L   Potassium 3.6 3.5 - 5.1 mmol/L   Chloride 103 98 - 111 mmol/L   CO2 17 (L) 22 - 32 mmol/L   Glucose, Bld 46 (L) 70 - 99 mg/dL   BUN 14 6 - 20 mg/dL   Creatinine, Ser 1.00 0.61 - 1.24 mg/dL   Calcium 8.1 (L) 8.9 - 10.3 mg/dL   Total Protein 6.3 (L) 6.5 - 8.1 g/dL   Albumin 2.5 (L) 3.5 - 5.0 g/dL   AST 42 (H) 15 - 41 U/L   ALT 21 0 - 44 U/L   Alkaline Phosphatase 54 38 - 126 U/L   Total Bilirubin 1.1 0.3 - 1.2 mg/dL   GFR calc non Af Amer >60 >60 mL/min   GFR calc Af Amer >60 >60 mL/min   Anion gap 12 5 - 15  Glucose, capillary     Status: None   Collection Time: 08/27/19  4:49 PM  Result Value Ref Range   Glucose-Capillary 98 70 - 99 mg/dL  TSH     Status: None   Collection Time: 08/27/19  5:02 PM  Result Value Ref Range   TSH 1.728 0.350 - 4.500 uIU/mL  Glucose, capillary     Status: Abnormal   Collection Time: 08/27/19  5:53 PM  Result Value Ref Range   Glucose-Capillary 122 (H) 70 - 99 mg/dL  Glucose, capillary     Status: Abnormal   Collection Time: 08/27/19  6:44 PM  Result Value Ref Range   Glucose-Capillary 101 (H) 70 - 99 mg/dL  Glucose, capillary     Status: None   Collection Time: 08/27/19  7:49 PM  Result Value Ref Range   Glucose-Capillary 95 70 - 99 mg/dL   Comment 1 Notify RN    Comment 2 Document in Chart   Comprehensive metabolic panel     Status: Abnormal (Preliminary result)   Collection Time: 08/27/19  8:00 PM  Result Value Ref Range   Sodium PENDING 135 - 145 mmol/L   Potassium PENDING 3.5 - 5.1 mmol/L   Chloride PENDING 98 - 111 mmol/L   CO2 PENDING 22 - 32 mmol/L   Glucose, Bld PENDING 70 - 99 mg/dL   BUN PENDING 6 - 20 mg/dL   Creatinine, Ser PENDING 0.61 - 1.24 mg/dL   Calcium PENDING 8.9 - 10.3 mg/dL  Total Protein 6.3 (L) 6.5 - 8.1 g/dL   Albumin PENDING 3.5 - 5.0 g/dL   AST PENDING 15 - 41 U/L   ALT PENDING 0 - 44 U/L   Alkaline Phosphatase PENDING 38 - 126 U/L   Total Bilirubin  PENDING 0.3 - 1.2 mg/dL   GFR calc non Af Amer PENDING >60 mL/min   GFR calc Af Amer PENDING >60 mL/min   Anion gap PENDING 5 - 15  Glucose, capillary     Status: None   Collection Time: 08/27/19 10:12 PM  Result Value Ref Range   Glucose-Capillary 98 70 - 99 mg/dL   Comment 1 Notify RN    Comment 2 Document in Chart   Glucose, capillary     Status: None   Collection Time: 08/27/19 11:04 PM  Result Value Ref Range   Glucose-Capillary 93 70 - 99 mg/dL   Comment 1 Notify RN    Comment 2 Document in Chart   Glucose, capillary     Status: None   Collection Time: 08/28/19 12:05 AM  Result Value Ref Range   Glucose-Capillary 77 70 - 99 mg/dL   Comment 1 Notify RN    Comment 2 Document in Chart   Comprehensive metabolic panel     Status: Abnormal (Preliminary result)   Collection Time: 08/28/19 12:45 AM  Result Value Ref Range   Sodium 133 (L) 135 - 145 mmol/L   Potassium 3.1 (L) 3.5 - 5.1 mmol/L   Chloride 105 98 - 111 mmol/L   CO2 15 (L) 22 - 32 mmol/L   Glucose, Bld 60 (L) 70 - 99 mg/dL   BUN 13 6 - 20 mg/dL   Creatinine, Ser 1.13 0.61 - 1.24 mg/dL   Calcium 8.2 (L) 8.9 - 10.3 mg/dL   Total Protein PENDING 6.5 - 8.1 g/dL   Albumin 2.4 (L) 3.5 - 5.0 g/dL   AST 41 15 - 41 U/L   ALT 23 0 - 44 U/L   Alkaline Phosphatase 50 38 - 126 U/L   Total Bilirubin 1.0 0.3 - 1.2 mg/dL   GFR calc non Af Amer >60 >60 mL/min   GFR calc Af Amer >60 >60 mL/min   Anion gap 13 5 - 15  Glucose, capillary     Status: Abnormal   Collection Time: 08/28/19  1:09 AM  Result Value Ref Range   Glucose-Capillary 63 (L) 70 - 99 mg/dL   Comment 1 Notify RN    Comment 2 Document in Chart   Glucose, capillary     Status: Abnormal   Collection Time: 08/28/19  2:06 AM  Result Value Ref Range   Glucose-Capillary 62 (L) 70 - 99 mg/dL   Comment 1 Notify RN    Comment 2 Document in Chart   Glucose, capillary     Status: None   Collection Time: 08/28/19  3:04 AM  Result Value Ref Range   Glucose-Capillary  86 70 - 99 mg/dL   Comment 1 Notify RN    Comment 2 Document in Chart   Glucose, capillary     Status: Abnormal   Collection Time: 08/28/19  4:12 AM  Result Value Ref Range   Glucose-Capillary 139 (H) 70 - 99 mg/dL   Comment 1 Notify RN    Comment 2 Document in Chart   Comprehensive metabolic panel     Status: Abnormal   Collection Time: 08/28/19  4:35 AM  Result Value Ref Range   Sodium 128 (L) 135 - 145 mmol/L  Potassium 3.3 (L) 3.5 - 5.1 mmol/L   Chloride 99 98 - 111 mmol/L   CO2 14 (L) 22 - 32 mmol/L   Glucose, Bld 181 (H) 70 - 99 mg/dL   BUN 13 6 - 20 mg/dL   Creatinine, Ser 1.23 0.61 - 1.24 mg/dL   Calcium 8.0 (L) 8.9 - 10.3 mg/dL   Total Protein 6.0 (L) 6.5 - 8.1 g/dL   Albumin 2.3 (L) 3.5 - 5.0 g/dL   AST 37 15 - 41 U/L   ALT 22 0 - 44 U/L   Alkaline Phosphatase 48 38 - 126 U/L   Total Bilirubin 1.0 0.3 - 1.2 mg/dL   GFR calc non Af Amer >60 >60 mL/min   GFR calc Af Amer >60 >60 mL/min   Anion gap 15 5 - 15  CBC with Differential/Platelet     Status: Abnormal   Collection Time: 08/28/19  4:36 AM  Result Value Ref Range   WBC 7.6 4.0 - 10.5 K/uL   RBC 4.45 4.22 - 5.81 MIL/uL   Hemoglobin 12.8 (L) 13.0 - 17.0 g/dL   HCT 39.8 39 - 52 %   MCV 89.4 80.0 - 100.0 fL   MCH 28.8 26.0 - 34.0 pg   MCHC 32.2 30.0 - 36.0 g/dL   RDW 14.5 11.5 - 15.5 %   Platelets 215 150 - 400 K/uL   nRBC 0.0 0.0 - 0.2 %   Neutrophils Relative % 71 %   Neutro Abs 5.5 1.7 - 7.7 K/uL   Lymphocytes Relative 17 %   Lymphs Abs 1.3 0.7 - 4.0 K/uL   Monocytes Relative 9 %   Monocytes Absolute 0.7 0 - 1 K/uL   Eosinophils Relative 1 %   Eosinophils Absolute 0.0 0 - 0 K/uL   Basophils Relative 1 %   Basophils Absolute 0.0 0 - 0 K/uL   Immature Granulocytes 1 %   Abs Immature Granulocytes 0.08 (H) 0.00 - 0.07 K/uL   Polychromasia PRESENT   Magnesium     Status: None   Collection Time: 08/28/19  4:36 AM  Result Value Ref Range   Magnesium 2.0 1.7 - 2.4 mg/dL  Phosphorus     Status: Abnormal    Collection Time: 08/28/19  4:36 AM  Result Value Ref Range   Phosphorus 2.4 (L) 2.5 - 4.6 mg/dL  Lipid panel     Status: Abnormal   Collection Time: 08/28/19  4:36 AM  Result Value Ref Range   Cholesterol 509 (H) 0 - 200 mg/dL   Triglycerides 1,567 (H) <150 mg/dL   HDL 21 (L) >40 mg/dL   Total CHOL/HDL Ratio NOT REPORTED DUE TO HIGH TRIGLYCERIDES RATIO   VLDL UNABLE TO CALCULATE IF TRIGLYCERIDE OVER 400 mg/dL 0 - 40 mg/dL   LDL Cholesterol UNABLE TO CALCULATE IF TRIGLYCERIDE OVER 400 mg/dL 0 - 99 mg/dL  Lipase, blood     Status: Abnormal   Collection Time: 08/28/19  4:36 AM  Result Value Ref Range   Lipase 169 (H) 11 - 51 U/L  Glucose, capillary     Status: Abnormal   Collection Time: 08/28/19  5:04 AM  Result Value Ref Range   Glucose-Capillary 185 (H) 70 - 99 mg/dL   Comment 1 Notify RN    Comment 2 Document in Chart   Glucose, capillary     Status: Abnormal   Collection Time: 08/28/19  6:02 AM  Result Value Ref Range   Glucose-Capillary 185 (H) 70 - 99 mg/dL  Comment 1 Notify RN    Comment 2 Document in Chart   Glucose, capillary     Status: Abnormal   Collection Time: 08/28/19  6:59 AM  Result Value Ref Range   Glucose-Capillary 162 (H) 70 - 99 mg/dL   Comment 1 Notify RN    Comment 2 Document in Chart   Glucose, capillary     Status: Abnormal   Collection Time: 08/28/19  8:00 AM  Result Value Ref Range   Glucose-Capillary 162 (H) 70 - 99 mg/dL  Glucose, capillary     Status: Abnormal   Collection Time: 08/28/19  9:10 AM  Result Value Ref Range   Glucose-Capillary 132 (H) 70 - 99 mg/dL    Recent Results (from the past 240 hour(s))  SARS Coronavirus 2 by RT PCR (hospital order, performed in [facility] hospital lab) Nasopharyngeal Nasopharyngeal Swab     Status: None   Collection Time: 08/25/19  5:44 AM   Specimen: Nasopharyngeal Swab  Result Value Ref Range Status   SARS Coronavirus 2 NEGATIVE NEGATIVE Final    Comment: (NOTE) SARS-CoV-2 target nucleic acids  are NOT DETECTED.  The SARS-CoV-2 RNA is generally detectable in upper and lower respiratory specimens during the acute phase of infection. The lowest concentration of SARS-CoV-2 viral copies this assay can detect is 250 copies / mL. A negative result does not preclude SARS-CoV-2 infection and should not be used as the sole basis for treatment or other patient management decisions.  A negative result may occur with improper specimen collection / handling, submission of specimen other than nasopharyngeal swab, presence of viral mutation(s) within the areas targeted by this assay, and inadequate number of viral copies (<250 copies / mL). A negative result must be combined with clinical observations, patient history, and epidemiological information.  Fact Sheet for Patients:   StrictlyIdeas.no  Fact Sheet for Healthcare Providers: BankingDealers.co.za  This test is not yet approved or  cleared by the [place] FDA and has been authorized for detection and/or diagnosis of SARS-CoV-2 by FDA under an Emergency Use Authorization (EUA).  This EUA will remain in effect (meaning this test can be used) for the duration of the COVID-19 declaration under Section 564(b)(1) of the Act, 21 U.S.C. section 360bbb-3(b)(1), unless the authorization is terminated or revoked sooner.  Performed at [facility], [place] [address], [place], [place] [address]   MRSA PCR Screening     Status: Abnormal   Collection Time: 08/25/19  7:31 PM   Specimen: Nasopharyngeal  Result Value Ref Range Status   MRSA by PCR POSITIVE (A) NEGATIVE Final    Comment:        The GeneXpert MRSA Assay (FDA approved for NASAL specimens only), is one component of a comprehensive MRSA colonization surveillance program. It is not intended to diagnose MRSA infection nor to guide or monitor treatment for MRSA infections. RESULT CALLED TO, READ BACK BY AND  VERIFIED WITH: CHIP, EMILY @ 0102 08/26/2019 Rikki Spearing. Performed at [facility], [place] [address] [place], [place] [address]      Radiology Studies: DG Abd Portable 2V  Result Date: 08/27/2019 CLINICAL DATA:  Pain EXAM: PORTABLE ABDOMEN - 2 VIEW COMPARISON:  August 26, 2019 FINDINGS: The bowel gas pattern is normal. There is no evidence of free air. No radio-opaque calculi or other significant radiographic abnormality is seen. There is a moderate amount of stool. IMPRESSION: Nonobstructive bowel gas pattern.  Moderate stool burden. Electronically Signed   By: Harrell Gave  Green M.D.   On: 08/27/2019 15:44   ECHOCARDIOGRAM COMPLETE  Result Date: 08/26/2019    ECHOCARDIOGRAM REPORT   Patient Name:   Douglas [name]. Date of Exam: 08/26/2019 Medical Rec #:  [id number]               Height:       72.0 in Accession #:    [id number]              Weight:       260.0 lb Date of Birth:  [birth year]               BSA:          2.382 m Patient Age:    34 years                BP:           137/76 mmHg Patient Gender: M                       HR:           109 bpm. Exam Location:  Inpatient Procedure: 2D Echo Indications:    Abnormal ECG R94.31  History:        Patient has no prior history of Echocardiogram examinations.                 Risk Factors:Hypertension and Diabetes.  Sonographer:    Mikki Santee RDCS (AE) Referring Phys: [place]  1. Abnormal septal motion . Left ventricular ejection fraction, by estimation, is 50 to 55%. The left ventricle has low normal function. The left ventricle has no regional wall motion abnormalities. There is mild left ventricular hypertrophy. Left ventricular diastolic parameters are consistent with Grade I diastolic dysfunction (impaired relaxation).  2. Right ventricular systolic function is normal. The right ventricular size is normal.  3. The mitral valve is normal in structure. No evidence of mitral valve regurgitation. No  evidence of mitral stenosis.  4. The aortic valve is tricuspid. Aortic valve regurgitation is not visualized. No aortic stenosis is present.  5. The inferior vena cava is normal in size with greater than 50% respiratory variability, suggesting right atrial pressure of 3 mmHg. FINDINGS  Left Ventricle: Abnormal septal motion. Left ventricular ejection fraction, by estimation, is 50 to 55%. The left ventricle has low normal function. The left ventricle has no regional wall motion abnormalities. The left ventricular internal cavity size was normal in size. There is mild left ventricular hypertrophy. Left ventricular diastolic parameters are consistent with Grade I diastolic dysfunction (impaired relaxation). Right Ventricle: The right ventricular size is normal. No increase in right ventricular wall thickness. Right ventricular systolic function is normal. Left Atrium: Left atrial size was normal in size. Right Atrium: Right atrial size was normal in size. Pericardium: There is no evidence of pericardial effusion. Mitral Valve: The mitral valve is normal in structure. Normal mobility of the mitral valve leaflets. No evidence of mitral valve regurgitation. No evidence of mitral valve stenosis. Tricuspid Valve: The tricuspid valve is normal in structure. Tricuspid valve regurgitation is not demonstrated. No evidence of tricuspid stenosis. Aortic Valve: The aortic valve is tricuspid. Aortic valve regurgitation is not visualized. No aortic stenosis is present. Pulmonic Valve: The pulmonic valve was normal in structure. Pulmonic valve regurgitation is not visualized. No evidence of pulmonic stenosis. Aorta: The aortic root is normal in size and structure. Venous: The inferior vena cava is normal in  size with greater than 50% respiratory variability, suggesting right atrial pressure of 3 mmHg. IAS/Shunts: No atrial level shunt detected by color flow Doppler.  LEFT VENTRICLE PLAX 2D LVIDd:         4.20 cm  Diastology LVIDs:          2.80 cm  LV e' lateral:   9.68 cm/s LV PW:         1.50 cm  LV E/e' lateral: 5.7 LV IVS:        1.20 cm  LV e' medial:    8.92 cm/s LVOT diam:     2.50 cm  LV E/e' medial:  6.2 LV SV:         61 LV SV Index:   26 LVOT Area:     4.91 cm  RIGHT VENTRICLE RV S prime:     26.10 cm/s TAPSE (M-mode): 2.0 cm LEFT ATRIUM             Index      RIGHT ATRIUM           Index LA diam:        3.20 cm 1.34 cm/m RA Area:     11.80 cm LA Vol (A2C):   22.1 ml 9.28 ml/m RA Volume:   22.80 ml  9.57 ml/m LA Vol (A4C):   18.3 ml 7.68 ml/m LA Biplane Vol: 21.2 ml 8.90 ml/m  AORTIC VALVE LVOT Vmax:   77.70 cm/s LVOT Vmean:  50.100 cm/s LVOT VTI:    0.125 m  AORTA Ao Root diam: 3.20 cm MITRAL VALVE MV Area (PHT): 3.42 cm    SHUNTS MV Decel Time: 222 msec    Systemic VTI:  0.12 m MV E velocity: 55.20 cm/s  Systemic Diam: 2.50 cm MV A velocity: 62.70 cm/s MV E/A ratio:  0.88 Jenkins Rouge MD Electronically signed by Jenkins Rouge MD Signature Date/Time: 08/26/2019/3:16:03 PM    Final    DG Abd Portable 2V  Final Result    DG Abd Portable 2V  Final Result    [place] EKG SITE RITE  Final Result    CT ABDOMEN PELVIS W CONTRAST  Final Result    DG Chest Portable 1 View  Final Result       Scheduled Meds: . Chlorhexidine Gluconate Cloth  6 each Topical Q0600  . icosapent Ethyl  2 g Oral BID  . metoprolol tartrate  25 mg Oral BID  . mupirocin ointment  1 application Nasal BID  . sodium chloride flush  10-40 mL Intracatheter Q12H   PRN Meds: dextrose, hydrALAZINE, HYDROmorphone (DILAUDID) injection, labetalol, metoprolol tartrate, ondansetron (ZOFRAN) IV, prochlorperazine, sodium chloride flush Continuous Infusions: . sodium chloride 75 mL/hr at 08/28/19 0936  . dextrose 75 mL/hr at 08/28/19 0453  . insulin 4 Units/hr (08/28/19 0121)  . potassium PHOSPHATE IVPB (in mmol) 30 mmol (08/28/19 0921)      LOS: 3 days  Time spent: Greater than 50% of the 35 minute visit was spent in counseling/coordination of  care for the patient as laid out in the A&P.   Dwyane Dee, MD [facility] 08/28/2019, 9:43 AM   Contact via secure chat.  To contact the attending provider between 7A-7P or the covering provider during after hours 7P-7A, please log into the web site [email or web address] and access using universal [place] password for that web site. If you do not have the password, please call the hospital operator.

## 2019-08-29 LAB — COMPREHENSIVE METABOLIC PANEL
ALT: 23 U/L (ref 0–44)
ALT: 25 U/L (ref 0–44)
ALT: 25 U/L (ref 0–44)
ALT: 26 U/L (ref 0–44)
ALT: 28 U/L (ref 0–44)
ALT: 29 U/L (ref 0–44)
AST: 38 U/L (ref 15–41)
AST: 39 U/L (ref 15–41)
AST: 43 U/L — ABNORMAL HIGH (ref 15–41)
AST: 43 U/L — ABNORMAL HIGH (ref 15–41)
AST: 44 U/L — ABNORMAL HIGH (ref 15–41)
AST: 44 U/L — ABNORMAL HIGH (ref 15–41)
Albumin: 2.5 g/dL — ABNORMAL LOW (ref 3.5–5.0)
Albumin: 2.5 g/dL — ABNORMAL LOW (ref 3.5–5.0)
Albumin: 2.5 g/dL — ABNORMAL LOW (ref 3.5–5.0)
Albumin: 2.6 g/dL — ABNORMAL LOW (ref 3.5–5.0)
Albumin: 2.6 g/dL — ABNORMAL LOW (ref 3.5–5.0)
Albumin: 2.7 g/dL — ABNORMAL LOW (ref 3.5–5.0)
Alkaline Phosphatase: 63 U/L (ref 38–126)
Alkaline Phosphatase: 67 U/L (ref 38–126)
Alkaline Phosphatase: 68 U/L (ref 38–126)
Alkaline Phosphatase: 76 U/L (ref 38–126)
Alkaline Phosphatase: 78 U/L (ref 38–126)
Alkaline Phosphatase: 81 U/L (ref 38–126)
Anion gap: 10 (ref 5–15)
Anion gap: 10 (ref 5–15)
Anion gap: 12 (ref 5–15)
Anion gap: 12 (ref 5–15)
Anion gap: 8 (ref 5–15)
Anion gap: 9 (ref 5–15)
BUN: 6 mg/dL (ref 6–20)
BUN: 7 mg/dL (ref 6–20)
BUN: 7 mg/dL (ref 6–20)
BUN: 8 mg/dL (ref 6–20)
BUN: 8 mg/dL (ref 6–20)
BUN: 9 mg/dL (ref 6–20)
CO2: 19 mmol/L — ABNORMAL LOW (ref 22–32)
CO2: 19 mmol/L — ABNORMAL LOW (ref 22–32)
CO2: 21 mmol/L — ABNORMAL LOW (ref 22–32)
CO2: 21 mmol/L — ABNORMAL LOW (ref 22–32)
CO2: 22 mmol/L (ref 22–32)
CO2: 22 mmol/L (ref 22–32)
Calcium: 8.3 mg/dL — ABNORMAL LOW (ref 8.9–10.3)
Calcium: 8.4 mg/dL — ABNORMAL LOW (ref 8.9–10.3)
Calcium: 8.4 mg/dL — ABNORMAL LOW (ref 8.9–10.3)
Calcium: 8.5 mg/dL — ABNORMAL LOW (ref 8.9–10.3)
Calcium: 8.6 mg/dL — ABNORMAL LOW (ref 8.9–10.3)
Calcium: 8.7 mg/dL — ABNORMAL LOW (ref 8.9–10.3)
Chloride: 100 mmol/L (ref 98–111)
Chloride: 100 mmol/L (ref 98–111)
Chloride: 99 mmol/L (ref 98–111)
Chloride: 99 mmol/L (ref 98–111)
Chloride: 99 mmol/L (ref 98–111)
Chloride: 99 mmol/L (ref 98–111)
Creatinine, Ser: 0.76 mg/dL (ref 0.61–1.24)
Creatinine, Ser: 0.79 mg/dL (ref 0.61–1.24)
Creatinine, Ser: 0.79 mg/dL (ref 0.61–1.24)
Creatinine, Ser: 0.82 mg/dL (ref 0.61–1.24)
Creatinine, Ser: 0.87 mg/dL (ref 0.61–1.24)
Creatinine, Ser: 0.89 mg/dL (ref 0.61–1.24)
GFR calc Af Amer: >60 mL/min (ref >60)
GFR calc Af Amer: >60 mL/min (ref >60)
GFR calc Af Amer: >60 mL/min (ref >60)
GFR calc Af Amer: >60 mL/min (ref >60)
GFR calc Af Amer: >60 mL/min (ref >60)
GFR calc Af Amer: >60 mL/min (ref >60)
GFR calc non Af Amer: >60 mL/min (ref >60)
GFR calc non Af Amer: >60 mL/min (ref >60)
GFR calc non Af Amer: >60 mL/min (ref >60)
GFR calc non Af Amer: >60 mL/min (ref >60)
GFR calc non Af Amer: >60 mL/min (ref >60)
GFR calc non Af Amer: >60 mL/min (ref >60)
Glucose, Bld: 106 mg/dL — ABNORMAL HIGH (ref 70–99)
Glucose, Bld: 167 mg/dL — ABNORMAL HIGH (ref 70–99)
Glucose, Bld: 224 mg/dL — ABNORMAL HIGH (ref 70–99)
Glucose, Bld: 64 mg/dL — ABNORMAL LOW (ref 70–99)
Glucose, Bld: 67 mg/dL — ABNORMAL LOW (ref 70–99)
Glucose, Bld: 96 mg/dL (ref 70–99)
Potassium: 2.8 mmol/L — ABNORMAL LOW (ref 3.5–5.1)
Potassium: 2.9 mmol/L — ABNORMAL LOW (ref 3.5–5.1)
Potassium: 2.9 mmol/L — ABNORMAL LOW (ref 3.5–5.1)
Potassium: 2.9 mmol/L — ABNORMAL LOW (ref 3.5–5.1)
Potassium: 3 mmol/L — ABNORMAL LOW (ref 3.5–5.1)
Potassium: 3.1 mmol/L — ABNORMAL LOW (ref 3.5–5.1)
Sodium: 128 mmol/L — ABNORMAL LOW (ref 135–145)
Sodium: 129 mmol/L — ABNORMAL LOW (ref 135–145)
Sodium: 130 mmol/L — ABNORMAL LOW (ref 135–145)
Sodium: 131 mmol/L — ABNORMAL LOW (ref 135–145)
Sodium: 131 mmol/L — ABNORMAL LOW (ref 135–145)
Sodium: 132 mmol/L — ABNORMAL LOW (ref 135–145)
Total Bilirubin: 0.6 mg/dL (ref 0.3–1.2)
Total Bilirubin: 0.6 mg/dL (ref 0.3–1.2)
Total Bilirubin: 0.6 mg/dL (ref 0.3–1.2)
Total Bilirubin: 0.7 mg/dL (ref 0.3–1.2)
Total Bilirubin: 0.7 mg/dL (ref 0.3–1.2)
Total Bilirubin: 0.8 mg/dL (ref 0.3–1.2)
Total Protein: 6.1 g/dL — ABNORMAL LOW (ref 6.5–8.1)
Total Protein: 6.2 g/dL — ABNORMAL LOW (ref 6.5–8.1)
Total Protein: 6.3 g/dL — ABNORMAL LOW (ref 6.5–8.1)
Total Protein: 6.3 g/dL — ABNORMAL LOW (ref 6.5–8.1)
Total Protein: 6.4 g/dL — ABNORMAL LOW (ref 6.5–8.1)
Total Protein: 6.6 g/dL (ref 6.5–8.1)

## 2019-08-29 LAB — LIPID PANEL
Cholesterol: 450 mg/dL — ABNORMAL HIGH (ref 0–200)
Cholesterol: 388 mg/dL — ABNORMAL HIGH (ref 0–200)
HDL: 22 mg/dL — ABNORMAL LOW (ref >40)
HDL: 20 mg/dL — ABNORMAL LOW (ref >40)
LDL Cholesterol: UNDETERMINED mg/dL (ref 0–99)
LDL Cholesterol: UNDETERMINED mg/dL (ref 0–99)
Total CHOL/HDL Ratio: 20.5 RATIO
Total CHOL/HDL Ratio: 19.4 RATIO
Triglycerides: 767 mg/dL — ABNORMAL HIGH (ref <150)
Triglycerides: 600 mg/dL — ABNORMAL HIGH (ref <150)
VLDL: UNDETERMINED mg/dL (ref 0–40)
VLDL: UNDETERMINED mg/dL (ref 0–40)

## 2019-08-29 LAB — CBC WITH DIFFERENTIAL/PLATELET
Abs Immature Granulocytes: 0.52 10*3/uL — ABNORMAL HIGH (ref 0.00–0.07)
Basophils Absolute: 0.1 10*3/uL (ref 0.0–0.1)
Basophils Relative: 1 %
Eosinophils Absolute: 0.1 10*3/uL (ref 0.0–0.5)
Eosinophils Relative: 1 %
HCT: 36.8 % — ABNORMAL LOW (ref 39.0–52.0)
Hemoglobin: 11.9 g/dL — ABNORMAL LOW (ref 13.0–17.0)
Immature Granulocytes: 6 %
Lymphocytes Relative: 21 %
Lymphs Abs: 1.9 10*3/uL (ref 0.7–4.0)
MCH: 28.3 pg (ref 26.0–34.0)
MCHC: 32.3 g/dL (ref 30.0–36.0)
MCV: 87.6 fL (ref 80.0–100.0)
Monocytes Absolute: 1.3 10*3/uL — ABNORMAL HIGH (ref 0.1–1.0)
Monocytes Relative: 14 %
Neutro Abs: 5.3 10*3/uL (ref 1.7–7.7)
Neutrophils Relative %: 57 %
Platelets: 248 10*3/uL (ref 150–400)
RBC: 4.2 MIL/uL — ABNORMAL LOW (ref 4.22–5.81)
RDW: 14.5 % (ref 11.5–15.5)
WBC: 9.2 10*3/uL (ref 4.0–10.5)
nRBC: 0 % (ref 0.0–0.2)

## 2019-08-29 LAB — GLUCOSE, CAPILLARY
Glucose-Capillary: 130 mg/dL — ABNORMAL HIGH (ref 70–99)
Glucose-Capillary: 131 mg/dL — ABNORMAL HIGH (ref 70–99)
Glucose-Capillary: 159 mg/dL — ABNORMAL HIGH (ref 70–99)
Glucose-Capillary: 160 mg/dL — ABNORMAL HIGH (ref 70–99)
Glucose-Capillary: 165 mg/dL — ABNORMAL HIGH (ref 70–99)
Glucose-Capillary: 187 mg/dL — ABNORMAL HIGH (ref 70–99)
Glucose-Capillary: 189 mg/dL — ABNORMAL HIGH (ref 70–99)
Glucose-Capillary: 193 mg/dL — ABNORMAL HIGH (ref 70–99)
Glucose-Capillary: 194 mg/dL — ABNORMAL HIGH (ref 70–99)
Glucose-Capillary: 53 mg/dL — ABNORMAL LOW (ref 70–99)
Glucose-Capillary: 55 mg/dL — ABNORMAL LOW (ref 70–99)
Glucose-Capillary: 57 mg/dL — ABNORMAL LOW (ref 70–99)
Glucose-Capillary: 57 mg/dL — ABNORMAL LOW (ref 70–99)
Glucose-Capillary: 58 mg/dL — ABNORMAL LOW (ref 70–99)
Glucose-Capillary: 62 mg/dL — ABNORMAL LOW (ref 70–99)
Glucose-Capillary: 65 mg/dL — ABNORMAL LOW (ref 70–99)
Glucose-Capillary: 69 mg/dL — ABNORMAL LOW (ref 70–99)
Glucose-Capillary: 70 mg/dL (ref 70–99)
Glucose-Capillary: 77 mg/dL (ref 70–99)
Glucose-Capillary: 77 mg/dL (ref 70–99)
Glucose-Capillary: 83 mg/dL (ref 70–99)
Glucose-Capillary: 84 mg/dL (ref 70–99)
Glucose-Capillary: 95 mg/dL (ref 70–99)

## 2019-08-29 LAB — PHOSPHORUS: Phosphorus: 2.5 mg/dL (ref 2.5–4.6)

## 2019-08-29 LAB — LIPASE, BLOOD: Lipase: 122 U/L — ABNORMAL HIGH (ref 11–51)

## 2019-08-29 LAB — LDL CHOLESTEROL, DIRECT
Direct LDL: 141.1 mg/dL — ABNORMAL HIGH (ref 0–99)
Direct LDL: 137.6 mg/dL — ABNORMAL HIGH (ref 0–99)

## 2019-08-29 LAB — MAGNESIUM: Magnesium: 2 mg/dL (ref 1.7–2.4)

## 2019-08-29 MED ORDER — DEXTROSE-NACL 5-0.45 % IV SOLN: INTRAVENOUS | Status: DC

## 2019-08-29 MED ORDER — CHLORHEXIDINE GLUCONATE CLOTH 2 % EX PADS
6.0000 | MEDICATED_PAD | Freq: Every day | CUTANEOUS | Status: DC
  Administered 2019-08-30 – 2019-09-01 (×3): 6 via TOPICAL

## 2019-08-29 MED ORDER — POTASSIUM CHLORIDE 10 MEQ/100ML IV SOLN
10.0000 meq | INTRAVENOUS | Status: AC
  Administered 2019-08-29 (×4): 10 meq via INTRAVENOUS
  Filled 2019-08-29 (×4): qty 100

## 2019-08-29 MED ORDER — ACETAMINOPHEN 325 MG PO TABS
650.0000 mg | ORAL_TABLET | Freq: Four times a day (QID) | ORAL | Status: DC | PRN
  Administered 2019-08-29 – 2019-08-31 (×4): 650 mg via ORAL
  Filled 2019-08-29 (×4): qty 2

## 2019-08-29 MED ORDER — ACETAMINOPHEN 650 MG RE SUPP: 650.0000 mg | Freq: Four times a day (QID) | RECTAL | Status: DC | PRN

## 2019-08-29 NOTE — Progress Notes (Signed)
Pt's blood sugar trending in the 50-60s. Asymptomatic otherwise. Dr. Natale Milch notified. Clarified PRN Dextrose dose. Per MD, hold D50 at this time and start clear liquid diet. Decrease insulin gtt to 4 units/hr. No s/s of distress at this time.

## 2019-08-29 NOTE — Progress Notes (Signed)
PROGRESS NOTE    Douglas [name].   PFY:[id number]  DOB: [birth year]  DOA: 08/25/2019     4  PCP: Tomasa Hose, NP  CC: N/V, abd pain  Hospital Course: Mr. Halling is a 34 year old African-American male with PMH obesity, hypertriglyceride induced pancreatitis, GERD, type 2 diabetes, hypertension, hyperlipidemia who presented to the hospital with severe abdominal pain, nausea, vomiting.  He stated the pain started around 2 AM and awoke him from sleep.  He has had ongoing nausea and vomiting throughout the day and presented for further evaluation.  In the ER he underwent work-up including CT abdomen/pelvis which revealed diffuse peripancreatic edema, fat stranding, and free fluid around the pancreas consistent with acute edematous pancreatitis.  No pancreatic necrosis appreciated or abscess. Notable labs include: WBC 6.2, Hgb 15.3, PLTC 226 Na 130, K 5.7, Co2 12, Glucose 242, BUN 10, Creat 0.96, AG 20, TB 4.9, AST 82, ALT Lipase 1035 VBG 7.23/31 Beta hydroxybutyrate 4.83  He is admitted for further treatment of DKA and hyperTG induced pancreatitis. TG initially on workup was 8,286.  He denies alcohol use.  He responded well to treatment of DKA. Care was then transitioned to focusing on treating his hyperTG induced pancreatitis. Insulin infusion was changed to fixed dosing with titrating dextrose infusions.  On the night of 08/26/19 he converted to afib with RVR and was given a cardizem bolus and persisted in afib with RVR but became hypotensive. He was asymptomatic and was started on oral lopressor.  He converted spontaneously back to NSR on 08/27/19.   Abdominal xray's were performed daily given risk of ileus development. On 7/12, imagining starting showing signs of ileus development. He was started on dulcolax supp with and continued with ongoing imaging studies.    Subjective:  Patient converted back into A. fib overnight, rate controlled and asymptomatic, patient indicates  his abdominal pain is markedly improving but not yet resolved.  Denies nausea vomiting diarrhea constipation headache fevers or chills.  He reports ongoing gas but no bowel movement yet  Assessment & Plan: Active Problems:   Acute pancreatitis   Hypertension   Type 2 diabetes mellitus without complication (HCC)   Hyperlipidemia   Atrial fibrillation with RVR (HCC)   Atrial fibrillation with RVR ([place]), now rate controlled - no prior history of similar; etiology likely in setting of acute illness - refractory to cardizem bolus; BP hypotensive; options would be chemical vs electrical cardioversion - cardiology consulted, appreciate assistance - had ordered heparin gtt and amiodarone drip but patient converted back to NSR morning of 08/27/19 prior to initiation of drips; therefore will hold off on starting unless converts again or per cardiology rec's - echo obtained on 7/10 prior to afib: EF 50-55%, Gr 1 DD - troponin x 1 negative on 7/10; denies any CP   Acute pancreatitis likely in the setting of hypertriglyceridemia -Patient denies alcohol use, -Triglycerides downtrending but still markedly elevated    Component Value Date/Time   CHOL 450 (H) 08/29/2019 0412   TRIG 767 (H) 08/29/2019 0412   HDL 22 (L) 08/29/2019 0412   CHOLHDL 20.5 08/29/2019 0412   VLDL UNABLE TO CALCULATE IF TRIGLYCERIDE OVER 400 mg/dL 08/29/2019 0412   LDLCALC UNABLE TO CALCULATE IF TRIGLYCERIDE OVER 400 mg/dL 08/29/2019 0412   LDLDIRECT 141.1 (H) 08/29/2019 0412  -Titrate down insulin drip, attempt clears today, follow clinically - continue q1h CBG checks - Continue Zofran, Compazine -Continue to advance diet as tolerated, will wean D5 half-normal saline and  insulin drip over the next 24 hours pending clinical status  DKA (diabetic ketoacidoses) ([place]), resolved - resolved with treatment; d/c protocol on 7/11 - see pancreatitis above for ongoing insulin drip  Hypertension, essential -Use labetalol or  hydralazine as needed  Type 2 diabetes mellitus markedly uncontrolled Lab Results  Component Value Date   HGBA1C >15.5 (H) 08/26/2019  - Patient states he takes Metformin only at home, no insulin -Likely transition to insulin at discharge, diabetes coordinator following  Hyperlipidemia/hypertriglyceridemia - Last lipid panel reviewed from August 2019.  Triglycerides were over 1000 at that time - Patient takes Lipitor and fenofibrate at home-add on fish oil 2 g twice daily - Follow-up lipid profile and trend every 12 hours while treating pancreatitis. Initial TG 8,286; continuing to downtrend; goal is TG<500 before discontinuing insulin infusion   Antimicrobials: N/AA  DVT prophylaxis: Lovenox Code Status: Full Family Communication: Mother via phone Disposition Plan:   Patient came from: Home       Barriers to d/c OR conditions which need to be met to effect a safe d/c: None  The current disposition plan is discharge to: Home Status is: Inpatient  Remains inpatient appropriate because:Persistent severe electrolyte disturbances, IV treatments appropriate due to intensity of illness or inability to take PO and Inpatient level of care appropriate due to severity of illness   Dispo: The patient is from: Home              Anticipated d/c is to: Home              Anticipated d/c date is: > 3 days              Patient currently is not medically stable to d/c.  Objective: Blood pressure 127/75, pulse (!) 193, temperature 99.5 F (37.5 C), temperature source Oral, resp. rate 20, height 6' (1.829 m), weight 117.9 kg, SpO2 99 %.   Intake/Output Summary (Last 24 hours) at 08/29/2019 0721 Last data filed at 08/29/2019 0600 Gross per 24 hour  Intake 6207.9 ml  Output 1375 ml  Net 4832.9 ml   Last Weight  Most recent update: 08/25/2019  5:30 AM   Weight  117.9 kg (260 lb)           Examination: General appearance: young adult man resting in bed appearing much more comfortable this  am Head: Normocephalic, without obvious abnormality Eyes: EOMI Lungs: clear to auscultation bilaterally Heart: regular rate and rhythm and S1, S2 normal Abdomen: soft, less TTP, BS hypoactive but better than yesterday, ND Extremities: no edema Skin: Skin color, texture, turgor normal. No rashes or lesions or mobility and turgor normal Neurologic: Grossly normal   Consultants:   Cardiology  Procedures:   n/a  Data Reviewed: I have personally reviewed following labs and imaging studies Results for orders placed or performed during the hospital encounter of 08/25/19 (from the past 24 hour(s))  Glucose, capillary     Status: Abnormal   Collection Time: 08/28/19  8:00 AM  Result Value Ref Range   Glucose-Capillary 162 (H) 70 - 99 mg/dL  Glucose, capillary     Status: Abnormal   Collection Time: 08/28/19  9:10 AM  Result Value Ref Range   Glucose-Capillary 132 (H) 70 - 99 mg/dL  Comprehensive metabolic panel     Status: Abnormal   Collection Time: 08/28/19  9:27 AM  Result Value Ref Range   Sodium 130 (L) 135 - 145 mmol/L   Potassium 3.1 (L) 3.5 - 5.1 mmol/L  Chloride 101 98 - 111 mmol/L   CO2 21 (L) 22 - 32 mmol/L   Glucose, Bld 126 (H) 70 - 99 mg/dL   BUN 13 6 - 20 mg/dL   Creatinine, Ser 0.86 0.61 - 1.24 mg/dL   Calcium 8.1 (L) 8.9 - 10.3 mg/dL   Total Protein 6.3 (L) 6.5 - 8.1 g/dL   Albumin 2.9 (L) 3.5 - 5.0 g/dL   AST 40 15 - 41 U/L   ALT 24 0 - 44 U/L   Alkaline Phosphatase 55 38 - 126 U/L   Total Bilirubin 0.7 0.3 - 1.2 mg/dL   GFR calc non Af Amer >60 >60 mL/min   GFR calc Af Amer >60 >60 mL/min   Anion gap 8 5 - 15  Glucose, capillary     Status: Abnormal   Collection Time: 08/28/19 10:28 AM  Result Value Ref Range   Glucose-Capillary 109 (H) 70 - 99 mg/dL  Glucose, capillary     Status: Abnormal   Collection Time: 08/28/19 11:24 AM  Result Value Ref Range   Glucose-Capillary 118 (H) 70 - 99 mg/dL  Glucose, capillary     Status: Abnormal   Collection  Time: 08/28/19 12:10 PM  Result Value Ref Range   Glucose-Capillary 146 (H) 70 - 99 mg/dL  Comprehensive metabolic panel     Status: Abnormal   Collection Time: 08/28/19 12:13 PM  Result Value Ref Range   Sodium 130 (L) 135 - 145 mmol/L   Potassium 3.5 3.5 - 5.1 mmol/L   Chloride 99 98 - 111 mmol/L   CO2 21 (L) 22 - 32 mmol/L   Glucose, Bld 131 (H) 70 - 99 mg/dL   BUN 12 6 - 20 mg/dL   Creatinine, Ser 0.93 0.61 - 1.24 mg/dL   Calcium 8.2 (L) 8.9 - 10.3 mg/dL   Total Protein 6.3 (L) 6.5 - 8.1 g/dL   Albumin 2.6 (L) 3.5 - 5.0 g/dL   AST 41 15 - 41 U/L   ALT 25 0 - 44 U/L   Alkaline Phosphatase 60 38 - 126 U/L   Total Bilirubin 0.7 0.3 - 1.2 mg/dL   GFR calc non Af Amer >60 >60 mL/min   GFR calc Af Amer >60 >60 mL/min   Anion gap 10 5 - 15  Glucose, capillary     Status: Abnormal   Collection Time: 08/28/19 12:56 PM  Result Value Ref Range   Glucose-Capillary 134 (H) 70 - 99 mg/dL  Glucose, capillary     Status: Abnormal   Collection Time: 08/28/19  1:59 PM  Result Value Ref Range   Glucose-Capillary 109 (H) 70 - 99 mg/dL  Glucose, capillary     Status: None   Collection Time: 08/28/19  3:04 PM  Result Value Ref Range   Glucose-Capillary 87 70 - 99 mg/dL  Glucose, capillary     Status: Abnormal   Collection Time: 08/28/19  4:00 PM  Result Value Ref Range   Glucose-Capillary 111 (H) 70 - 99 mg/dL  Lipid panel     Status: Abnormal   Collection Time: 08/28/19  4:05 PM  Result Value Ref Range   Cholesterol 463 (H) 0 - 200 mg/dL   Triglycerides 875 (H) <150 mg/dL   HDL 21 (L) >40 mg/dL   Total CHOL/HDL Ratio 22.0 RATIO   VLDL UNABLE TO CALCULATE IF TRIGLYCERIDE OVER 400 mg/dL 0 - 40 mg/dL   LDL Cholesterol UNABLE TO CALCULATE IF TRIGLYCERIDE OVER 400 mg/dL 0 - 99 mg/dL  Comprehensive metabolic panel     Status: Abnormal   Collection Time: 08/28/19  4:05 PM  Result Value Ref Range   Sodium 131 (L) 135 - 145 mmol/L   Potassium 3.7 3.5 - 5.1 mmol/L   Chloride 101 98 - 111  mmol/L   CO2 20 (L) 22 - 32 mmol/L   Glucose, Bld 137 (H) 70 - 99 mg/dL   BUN 11 6 - 20 mg/dL   Creatinine, Ser 0.82 0.61 - 1.24 mg/dL   Calcium 8.1 (L) 8.9 - 10.3 mg/dL   Total Protein 6.5 6.5 - 8.1 g/dL   Albumin 2.7 (L) 3.5 - 5.0 g/dL   AST 38 15 - 41 U/L   ALT 22 0 - 44 U/L   Alkaline Phosphatase 59 38 - 126 U/L   Total Bilirubin 0.8 0.3 - 1.2 mg/dL   GFR calc non Af Amer >60 >60 mL/min   GFR calc Af Amer >60 >60 mL/min   Anion gap 10 5 - 15  LDL cholesterol, direct     Status: Abnormal   Collection Time: 08/28/19  4:05 PM  Result Value Ref Range   Direct LDL 141.1 (H) 0 - 99 mg/dL  Glucose, capillary     Status: Abnormal   Collection Time: 08/28/19  5:05 PM  Result Value Ref Range   Glucose-Capillary 141 (H) 70 - 99 mg/dL  Glucose, capillary     Status: Abnormal   Collection Time: 08/28/19  6:00 PM  Result Value Ref Range   Glucose-Capillary 146 (H) 70 - 99 mg/dL  Glucose, capillary     Status: Abnormal   Collection Time: 08/28/19  7:06 PM  Result Value Ref Range   Glucose-Capillary 158 (H) 70 - 99 mg/dL  Comprehensive metabolic panel     Status: Abnormal   Collection Time: 08/28/19  8:00 PM  Result Value Ref Range   Sodium 130 (L) 135 - 145 mmol/L   Potassium 3.1 (L) 3.5 - 5.1 mmol/L   Chloride 99 98 - 111 mmol/L   CO2 20 (L) 22 - 32 mmol/L   Glucose, Bld 137 (H) 70 - 99 mg/dL   BUN 11 6 - 20 mg/dL   Creatinine, Ser 0.84 0.61 - 1.24 mg/dL   Calcium 8.5 (L) 8.9 - 10.3 mg/dL   Total Protein 6.5 6.5 - 8.1 g/dL   Albumin 2.7 (L) 3.5 - 5.0 g/dL   AST 38 15 - 41 U/L   ALT 23 0 - 44 U/L   Alkaline Phosphatase 64 38 - 126 U/L   Total Bilirubin 0.7 0.3 - 1.2 mg/dL   GFR calc non Af Amer >60 >60 mL/min   GFR calc Af Amer >60 >60 mL/min   Anion gap 11 5 - 15  Glucose, capillary     Status: Abnormal   Collection Time: 08/28/19  8:02 PM  Result Value Ref Range   Glucose-Capillary 120 (H) 70 - 99 mg/dL   Comment 1 Notify RN    Comment 2 Document in Chart   Glucose,  capillary     Status: Abnormal   Collection Time: 08/28/19  8:57 PM  Result Value Ref Range   Glucose-Capillary 122 (H) 70 - 99 mg/dL   Comment 1 Notify RN    Comment 2 Document in Chart   Glucose, capillary     Status: Abnormal   Collection Time: 08/28/19 10:01 PM  Result Value Ref Range   Glucose-Capillary 117 (H) 70 - 99 mg/dL   Comment 1 Notify RN  Comment 2 Document in Chart   Glucose, capillary     Status: None   Collection Time: 08/28/19 11:03 PM  Result Value Ref Range   Glucose-Capillary 85 70 - 99 mg/dL   Comment 1 Notify RN    Comment 2 Document in Chart   Comprehensive metabolic panel     Status: Abnormal   Collection Time: 08/29/19 12:00 AM  Result Value Ref Range   Sodium 132 (L) 135 - 145 mmol/L   Potassium 3.1 (L) 3.5 - 5.1 mmol/L   Chloride 100 98 - 111 mmol/L   CO2 22 22 - 32 mmol/L   Glucose, Bld 96 70 - 99 mg/dL   BUN 9 6 - 20 mg/dL   Creatinine, Ser 0.79 0.61 - 1.24 mg/dL   Calcium 8.6 (L) 8.9 - 10.3 mg/dL   Total Protein 6.3 (L) 6.5 - 8.1 g/dL   Albumin 2.6 (L) 3.5 - 5.0 g/dL   AST 38 15 - 41 U/L   ALT 23 0 - 44 U/L   Alkaline Phosphatase 63 38 - 126 U/L   Total Bilirubin 0.8 0.3 - 1.2 mg/dL   GFR calc non Af Amer >60 >60 mL/min   GFR calc Af Amer >60 >60 mL/min   Anion gap 10 5 - 15  Glucose, capillary     Status: None   Collection Time: 08/29/19 12:02 AM  Result Value Ref Range   Glucose-Capillary 83 70 - 99 mg/dL   Comment 1 Notify RN    Comment 2 Document in Chart   Glucose, capillary     Status: None   Collection Time: 08/29/19  1:00 AM  Result Value Ref Range   Glucose-Capillary 84 70 - 99 mg/dL   Comment 1 Notify RN    Comment 2 Document in Chart   Glucose, capillary     Status: None   Collection Time: 08/29/19  2:01 AM  Result Value Ref Range   Glucose-Capillary 77 70 - 99 mg/dL   Comment 1 Notify RN    Comment 2 Document in Chart   Glucose, capillary     Status: None   Collection Time: 08/29/19  3:02 AM  Result Value Ref Range    Glucose-Capillary 70 70 - 99 mg/dL   Comment 1 Notify RN    Comment 2 Document in Chart   Glucose, capillary     Status: Abnormal   Collection Time: 08/29/19  4:06 AM  Result Value Ref Range   Glucose-Capillary 55 (L) 70 - 99 mg/dL   Comment 1 Notify RN    Comment 2 Document in Chart   Comprehensive metabolic panel     Status: Abnormal   Collection Time: 08/29/19  4:12 AM  Result Value Ref Range   Sodium 131 (L) 135 - 145 mmol/L   Potassium 2.9 (L) 3.5 - 5.1 mmol/L   Chloride 100 98 - 111 mmol/L   CO2 19 (L) 22 - 32 mmol/L   Glucose, Bld 67 (L) 70 - 99 mg/dL   BUN 8 6 - 20 mg/dL   Creatinine, Ser 0.79 0.61 - 1.24 mg/dL   Calcium 8.7 (L) 8.9 - 10.3 mg/dL   Total Protein 6.6 6.5 - 8.1 g/dL   Albumin 2.7 (L) 3.5 - 5.0 g/dL   AST 39 15 - 41 U/L   ALT 26 0 - 44 U/L   Alkaline Phosphatase 67 38 - 126 U/L   Total Bilirubin 0.7 0.3 - 1.2 mg/dL   GFR calc non Af Amer >  60 >60 mL/min   GFR calc Af Amer >60 >60 mL/min   Anion gap 12 5 - 15  CBC with Differential/Platelet     Status: Abnormal   Collection Time: 08/29/19  4:12 AM  Result Value Ref Range   WBC 9.2 4.0 - 10.5 K/uL   RBC 4.20 (L) 4.22 - 5.81 MIL/uL   Hemoglobin 11.9 (L) 13.0 - 17.0 g/dL   HCT 36.8 (L) 39 - 52 %   MCV 87.6 80.0 - 100.0 fL   MCH 28.3 26.0 - 34.0 pg   MCHC 32.3 30.0 - 36.0 g/dL   RDW 14.5 11.5 - 15.5 %   Platelets 248 150 - 400 K/uL   nRBC 0.0 0.0 - 0.2 %   Neutrophils Relative % 57 %   Neutro Abs 5.3 1.7 - 7.7 K/uL   Lymphocytes Relative 21 %   Lymphs Abs 1.9 0.7 - 4.0 K/uL   Monocytes Relative 14 %   Monocytes Absolute 1.3 (H) 0 - 1 K/uL   Eosinophils Relative 1 %   Eosinophils Absolute 0.1 0 - 0 K/uL   Basophils Relative 1 %   Basophils Absolute 0.1 0 - 0 K/uL   Immature Granulocytes 6 %   Abs Immature Granulocytes 0.52 (H) 0.00 - 0.07 K/uL   Tear Drop Cells PRESENT    Polychromasia MARKED   Magnesium     Status: None   Collection Time: 08/29/19  4:12 AM  Result Value Ref Range    Magnesium 2.0 1.7 - 2.4 mg/dL  Phosphorus     Status: None   Collection Time: 08/29/19  4:12 AM  Result Value Ref Range   Phosphorus 2.5 2.5 - 4.6 mg/dL  Lipid panel     Status: Abnormal   Collection Time: 08/29/19  4:12 AM  Result Value Ref Range   Cholesterol 450 (H) 0 - 200 mg/dL   Triglycerides 767 (H) <150 mg/dL   HDL 22 (L) >40 mg/dL   Total CHOL/HDL Ratio 20.5 RATIO   VLDL UNABLE TO CALCULATE IF TRIGLYCERIDE OVER 400 mg/dL 0 - 40 mg/dL   LDL Cholesterol UNABLE TO CALCULATE IF TRIGLYCERIDE OVER 400 mg/dL 0 - 99 mg/dL  Lipase, blood     Status: Abnormal   Collection Time: 08/29/19  4:12 AM  Result Value Ref Range   Lipase 122 (H) 11 - 51 U/L  Glucose, capillary     Status: None   Collection Time: 08/29/19  5:13 AM  Result Value Ref Range   Glucose-Capillary 77 70 - 99 mg/dL   Comment 1 Notify RN    Comment 2 Document in Chart   Glucose, capillary     Status: Abnormal   Collection Time: 08/29/19  6:04 AM  Result Value Ref Range   Glucose-Capillary 57 (L) 70 - 99 mg/dL   Comment 1 Notify RN    Comment 2 Document in Chart   Glucose, capillary     Status: Abnormal   Collection Time: 08/29/19  6:43 AM  Result Value Ref Range   Glucose-Capillary 65 (L) 70 - 99 mg/dL    Recent Results (from the past 240 hour(s))  SARS Coronavirus 2 by RT PCR (hospital order, performed in [place] hospital lab) Nasopharyngeal Nasopharyngeal Swab     Status: None   Collection Time: 08/25/19  5:44 AM   Specimen: Nasopharyngeal Swab  Result Value Ref Range Status   SARS Coronavirus 2 NEGATIVE NEGATIVE Final    Comment: (NOTE) SARS-CoV-2 target nucleic acids are NOT DETECTED.  The SARS-CoV-2 RNA is generally detectable in upper and lower respiratory specimens during the acute phase of infection. The lowest concentration of SARS-CoV-2 viral copies this assay can detect is 250 copies / mL. A negative result does not preclude SARS-CoV-2 infection and should not be used as the sole basis for  treatment or other patient management decisions.  A negative result may occur with improper specimen collection / handling, submission of specimen other than nasopharyngeal swab, presence of viral mutation(s) within the areas targeted by this assay, and inadequate number of viral copies (<250 copies / mL). A negative result must be combined with clinical observations, patient history, and epidemiological information.  Fact Sheet for Patients:   StrictlyIdeas.no  Fact Sheet for Healthcare Providers: BankingDealers.co.za  This test is not yet approved or  cleared by the [place] FDA and has been authorized for detection and/or diagnosis of SARS-CoV-2 by FDA under an Emergency Use Authorization (EUA).  This EUA will remain in effect (meaning this test can be used) for the duration of the COVID-19 declaration under Section 564(b)(1) of the Act, 21 U.S.C. section 360bbb-3(b)(1), unless the authorization is terminated or revoked sooner.  Performed at [facility], [place] [address] [place], [place] [address]   MRSA PCR Screening     Status: Abnormal   Collection Time: 08/25/19  7:31 PM   Specimen: Nasopharyngeal  Result Value Ref Range Status   MRSA by PCR POSITIVE (A) NEGATIVE Final    Comment:        The GeneXpert MRSA Assay (FDA approved for NASAL specimens only), is one component of a comprehensive MRSA colonization surveillance program. It is not intended to diagnose MRSA infection nor to guide or monitor treatment for MRSA infections. RESULT CALLED TO, READ BACK BY AND VERIFIED WITH: CHIP, EMILY @ [phone] 08/26/2019 Rikki Spearing. Performed at [facility], [place] [address] [place], [place] [address]      Radiology Studies: DG Abd Portable 2V  Result Date: 08/28/2019 CLINICAL DATA:  Diffuse abdominal pain. EXAM: PORTABLE ABDOMEN - 2 VIEW COMPARISON:  07/28/2019 FINDINGS: Air-filled small  bowel loops and scattered air-fluid levels. No significant distension. There is also moderate air and stool throughout the colon and down into the rectosigmoid area. Findings suggest constipation and small-bowel ileus related to the patient's pancreatitis. No findings to suggest obstruction or free air. IMPRESSION: Ileus bowel gas pattern and probable constipation. No findings to suggest small bowel obstruction or free air. Electronically Signed   By: Marijo Sanes M.D.   On: 08/28/2019 10:18   DG Abd Portable 2V  Result Date: 08/27/2019 CLINICAL DATA:  Pain EXAM: PORTABLE ABDOMEN - 2 VIEW COMPARISON:  August 26, 2019 FINDINGS: The bowel gas pattern is normal. There is no evidence of free air. No radio-opaque calculi or other significant radiographic abnormality is seen. There is a moderate amount of stool. IMPRESSION: Nonobstructive bowel gas pattern.  Moderate stool burden. Electronically Signed   By: Constance Holster M.D.   On: 08/27/2019 15:44   DG Abd Portable 2V  Final Result    DG Abd Portable 2V  Final Result    DG Abd Portable 2V  Final Result    [place] EKG SITE RITE  Final Result    CT ABDOMEN PELVIS W CONTRAST  Final Result    DG Chest Portable 1 View  Final Result       Scheduled Meds:  bisacodyl  10 mg Rectal Daily   Chlorhexidine Gluconate Cloth  6  each Topical Q0600   icosapent Ethyl  2 g Oral BID   metoprolol succinate  50 mg Oral Daily   mupirocin ointment  1 application Nasal BID   sodium chloride flush  10-40 mL Intracatheter Q12H   PRN Meds: dextrose, hydrALAZINE, HYDROmorphone (DILAUDID) injection, ketorolac, labetalol, metoprolol tartrate, ondansetron (ZOFRAN) IV, prochlorperazine, sodium chloride flush Continuous Infusions:  sodium chloride 75 mL/hr at 08/28/19 2219   dextrose Stopped (08/29/19 0410)   insulin 8 Units/hr (08/29/19 0411)      LOS: 4 days  Time spent: Greater than 50% of the 35 minute visit was spent in counseling/coordination of  care for the patient as laid out in the A&P.   Little Ishikawa, DO [facility] 08/29/2019, 7:21 AM  Contact via secure chat.  To contact the attending provider between 7P-7A, please log onto [facility]

## 2019-08-29 NOTE — Progress Notes (Signed)
Inpatient Diabetes Program Recommendations  AACE/ADA: New Consensus Statement on Inpatient Glycemic Control (2015)  Target Ranges:  Prepandial:   less than 140 mg/dL      Peak postprandial:   less than 180 mg/dL (1-2 hours)      Critically ill patients:  140 - 180 mg/dL   Lab Results  Component Value Date   GLUCAP 62 (L) 08/29/2019   HGBA1C >15.5 (H) 08/26/2019    Review of Glycemic Control  Diabetes history: DM 2 Outpatient Diabetes medications: Glipizide 5 mg bid, Metformin 1000 mg bid Current orders for Inpatient glycemic control:  IV insulin gtt for triglycerides  Inpatient Diabetes Program Recommendations:    hypoglycemia on IV insulin still this am. D10 100 ml/hour. Insulin 8 units/hour. Triglycerides 767 this am.   A1c high >15.5%. May benefit from insulin at time of d/c.  Please place consult when appropriate for education and discussions for discharge.  Thanks,  Christena Deem RN, MSN, BC-ADM Inpatient Diabetes Coordinator Team Pager [phone] (8a-5p)

## 2019-08-30 LAB — GLUCOSE, CAPILLARY
Glucose-Capillary: 109 mg/dL — ABNORMAL HIGH (ref 70–99)
Glucose-Capillary: 111 mg/dL — ABNORMAL HIGH (ref 70–99)
Glucose-Capillary: 112 mg/dL — ABNORMAL HIGH (ref 70–99)
Glucose-Capillary: 113 mg/dL — ABNORMAL HIGH (ref 70–99)
Glucose-Capillary: 118 mg/dL — ABNORMAL HIGH (ref 70–99)
Glucose-Capillary: 121 mg/dL — ABNORMAL HIGH (ref 70–99)
Glucose-Capillary: 131 mg/dL — ABNORMAL HIGH (ref 70–99)
Glucose-Capillary: 136 mg/dL — ABNORMAL HIGH (ref 70–99)
Glucose-Capillary: 136 mg/dL — ABNORMAL HIGH (ref 70–99)
Glucose-Capillary: 143 mg/dL — ABNORMAL HIGH (ref 70–99)
Glucose-Capillary: 146 mg/dL — ABNORMAL HIGH (ref 70–99)
Glucose-Capillary: 149 mg/dL — ABNORMAL HIGH (ref 70–99)
Glucose-Capillary: 156 mg/dL — ABNORMAL HIGH (ref 70–99)
Glucose-Capillary: 160 mg/dL — ABNORMAL HIGH (ref 70–99)
Glucose-Capillary: 172 mg/dL — ABNORMAL HIGH (ref 70–99)
Glucose-Capillary: 179 mg/dL — ABNORMAL HIGH (ref 70–99)
Glucose-Capillary: 190 mg/dL — ABNORMAL HIGH (ref 70–99)
Glucose-Capillary: 195 mg/dL — ABNORMAL HIGH (ref 70–99)
Glucose-Capillary: 196 mg/dL — ABNORMAL HIGH (ref 70–99)
Glucose-Capillary: 77 mg/dL (ref 70–99)
Glucose-Capillary: 84 mg/dL (ref 70–99)

## 2019-08-30 LAB — COMPREHENSIVE METABOLIC PANEL
ALT: 27 U/L (ref 0–44)
ALT: 27 U/L (ref 0–44)
ALT: 28 U/L (ref 0–44)
AST: 38 U/L (ref 15–41)
AST: 39 U/L (ref 15–41)
AST: 41 U/L (ref 15–41)
Albumin: 2.4 g/dL — ABNORMAL LOW (ref 3.5–5.0)
Albumin: 2.6 g/dL — ABNORMAL LOW (ref 3.5–5.0)
Albumin: 2.7 g/dL — ABNORMAL LOW (ref 3.5–5.0)
Alkaline Phosphatase: 83 U/L (ref 38–126)
Alkaline Phosphatase: 84 U/L (ref 38–126)
Alkaline Phosphatase: 86 U/L (ref 38–126)
Anion gap: 10 (ref 5–15)
Anion gap: 11 (ref 5–15)
Anion gap: 9 (ref 5–15)
BUN: 5 mg/dL — ABNORMAL LOW (ref 6–20)
BUN: 6 mg/dL (ref 6–20)
BUN: 6 mg/dL (ref 6–20)
CO2: 20 mmol/L — ABNORMAL LOW (ref 22–32)
CO2: 21 mmol/L — ABNORMAL LOW (ref 22–32)
CO2: 21 mmol/L — ABNORMAL LOW (ref 22–32)
Calcium: 8.5 mg/dL — ABNORMAL LOW (ref 8.9–10.3)
Calcium: 8.7 mg/dL — ABNORMAL LOW (ref 8.9–10.3)
Calcium: 8.8 mg/dL — ABNORMAL LOW (ref 8.9–10.3)
Chloride: 101 mmol/L (ref 98–111)
Chloride: 101 mmol/L (ref 98–111)
Chloride: 102 mmol/L (ref 98–111)
Creatinine, Ser: 0.69 mg/dL (ref 0.61–1.24)
Creatinine, Ser: 0.76 mg/dL (ref 0.61–1.24)
Creatinine, Ser: 0.8 mg/dL (ref 0.61–1.24)
GFR calc Af Amer: >60 mL/min (ref >60)
GFR calc Af Amer: >60 mL/min (ref >60)
GFR calc Af Amer: >60 mL/min (ref >60)
GFR calc non Af Amer: >60 mL/min (ref >60)
GFR calc non Af Amer: >60 mL/min (ref >60)
GFR calc non Af Amer: >60 mL/min (ref >60)
Glucose, Bld: 127 mg/dL — ABNORMAL HIGH (ref 70–99)
Glucose, Bld: 142 mg/dL — ABNORMAL HIGH (ref 70–99)
Glucose, Bld: 157 mg/dL — ABNORMAL HIGH (ref 70–99)
Potassium: 2.9 mmol/L — ABNORMAL LOW (ref 3.5–5.1)
Potassium: 3 mmol/L — ABNORMAL LOW (ref 3.5–5.1)
Potassium: 3.2 mmol/L — ABNORMAL LOW (ref 3.5–5.1)
Sodium: 131 mmol/L — ABNORMAL LOW (ref 135–145)
Sodium: 132 mmol/L — ABNORMAL LOW (ref 135–145)
Sodium: 133 mmol/L — ABNORMAL LOW (ref 135–145)
Total Bilirubin: 0.5 mg/dL (ref 0.3–1.2)
Total Bilirubin: 0.8 mg/dL (ref 0.3–1.2)
Total Bilirubin: 0.8 mg/dL (ref 0.3–1.2)
Total Protein: 6.4 g/dL — ABNORMAL LOW (ref 6.5–8.1)
Total Protein: 6.4 g/dL — ABNORMAL LOW (ref 6.5–8.1)
Total Protein: 6.7 g/dL (ref 6.5–8.1)

## 2019-08-30 LAB — CBC WITH DIFFERENTIAL/PLATELET
Abs Immature Granulocytes: 1.16 10*3/uL — ABNORMAL HIGH (ref 0.00–0.07)
Basophils Absolute: 0.1 10*3/uL (ref 0.0–0.1)
Basophils Relative: 1 %
Eosinophils Absolute: 0.1 10*3/uL (ref 0.0–0.5)
Eosinophils Relative: 2 %
HCT: 34.2 % — ABNORMAL LOW (ref 39.0–52.0)
Hemoglobin: 10.8 g/dL — ABNORMAL LOW (ref 13.0–17.0)
Immature Granulocytes: 13 %
Lymphocytes Relative: 14 %
Lymphs Abs: 1.2 10*3/uL (ref 0.7–4.0)
MCH: 27.8 pg (ref 26.0–34.0)
MCHC: 31.6 g/dL (ref 30.0–36.0)
MCV: 88.1 fL (ref 80.0–100.0)
Monocytes Absolute: 1.4 10*3/uL — ABNORMAL HIGH (ref 0.1–1.0)
Monocytes Relative: 16 %
Neutro Abs: 4.7 10*3/uL (ref 1.7–7.7)
Neutrophils Relative %: 54 %
Platelets: 247 10*3/uL (ref 150–400)
RBC: 3.88 MIL/uL — ABNORMAL LOW (ref 4.22–5.81)
RDW: 14.5 % (ref 11.5–15.5)
WBC: 8.7 10*3/uL (ref 4.0–10.5)
nRBC: 0 % (ref 0.0–0.2)

## 2019-08-30 LAB — PHOSPHORUS: Phosphorus: 3.5 mg/dL (ref 2.5–4.6)

## 2019-08-30 LAB — LIPID PANEL
Cholesterol: 387 mg/dL — ABNORMAL HIGH (ref 0–200)
HDL: 19 mg/dL — ABNORMAL LOW (ref >40)
LDL Cholesterol: UNDETERMINED mg/dL (ref 0–99)
Total CHOL/HDL Ratio: 20.4 RATIO
Triglycerides: 581 mg/dL — ABNORMAL HIGH (ref <150)
VLDL: UNDETERMINED mg/dL (ref 0–40)

## 2019-08-30 LAB — MAGNESIUM: Magnesium: 1.9 mg/dL (ref 1.7–2.4)

## 2019-08-30 LAB — HEMOGLOBIN A1C
Hgb A1c MFr Bld: >15.5 % — ABNORMAL HIGH (ref 4.8–5.6)
Mean Plasma Glucose: >398 mg/dL

## 2019-08-30 LAB — LDL CHOLESTEROL, DIRECT: Direct LDL: 157.5 mg/dL — ABNORMAL HIGH (ref 0–99)

## 2019-08-30 LAB — LIPASE, BLOOD: Lipase: 111 U/L — ABNORMAL HIGH (ref 11–51)

## 2019-08-30 MED ORDER — POLYETHYLENE GLYCOL 3350 17 G PO PACK
17.0000 g | PACK | Freq: Every day | ORAL | Status: DC
  Administered 2019-08-30 – 2019-09-01 (×3): 17 g via ORAL
  Filled 2019-08-30 (×3): qty 1

## 2019-08-30 NOTE — Progress Notes (Signed)
PROGRESS NOTE    Douglas [name].   JYN:[id number]  DOB: Feb 26, 1985  DOA: 08/25/2019     5  PCP: Douglas Hose, NP  CC: N/V, abd pain  Hospital Course: Douglas [name] is Douglas 34 year old African-American [name] with PMH obesity, hypertriglyceride induced pancreatitis, GERD, type 2 diabetes, hypertension, hyperlipidemia who presented to the hospital with severe abdominal pain, nausea, vomiting.  He stated the pain started around 2 AM and awoke him from sleep.  He has had ongoing nausea and vomiting throughout the day and presented for further evaluation. In the ER he underwent work-up including CT abdomen/pelvis which revealed diffuse peripancreatic edema, fat stranding, and free fluid around the pancreas consistent with acute edematous pancreatitis.  No pancreatic necrosis appreciated or abscess. He is admitted for further treatment of DKA and hyperTG induced pancreatitis. TG initially on workup was 8,286. He denies alcohol use.   He responded well to treatment of DKA. Care was then transitioned to focusing on treating his hypertriglyceridemia induced pancreatitis. Insulin infusion was changed to fixed dosing with titrating dextrose infusions. On the night of 08/26/19 he converted to afib with RVR and was given Douglas cardizem bolus and persisted in afib with RVR but became hypotensive. He was asymptomatic and was started on oral lopressor. He converted spontaneously back to NSR on 08/27/19. Abdominal xray's were performed daily given risk of ileus development. On 7/12, imagining starting showing signs of ileus development. He was started on dulcolax supp with and continued with ongoing imaging studies.    Subjective:  No acute issues or events overnight - denies nausea vomiting diarrhea constipation headache fevers or chills.  He reports ongoing gas as well small bowel movement early this morning.  Assessment & Plan: Active Problems:   Acute pancreatitis   Hypertension   Type 2 diabetes  mellitus without complication (HCC)   Hyperlipidemia   Atrial fibrillation with RVR (HCC)   Acute pancreatitis likely in the setting of hypertriglyceridemia -Patient denies alcohol use, -Triglycerides downtrending (8k at admission) but still markedly elevated:    Component Value Date/Time   CHOL 387 (H) 08/30/2019 0406   TRIG 581 (H) 08/30/2019 0406   HDL 19 (L) 08/30/2019 0406   CHOLHDL 20.4 08/30/2019 0406   VLDL UNABLE TO CALCULATE IF TRIGLYCERIDE OVER 400 mg/dL 08/30/2019 0406   LDLCALC UNABLE TO CALCULATE IF TRIGLYCERIDE OVER 400 mg/dL 08/30/2019 0406   LDLDIRECT 137.6 (H) 08/29/2019 1617  - Continue insulin drip _0 /hr - will discontinue once triglycerides below 500 - Continue clears for now - continue q1h CBG checks - well controlled - Continue Zofran, Compazine -Continue to advance diet as tolerated, will wean D5 half-normal saline and insulin drip over the next 24 hours pending clinical status  Atrial fibrillation with RVR ([place]), now rate controlled - no prior history of similar; etiology likely in setting of acute illness - cardiology consulted, appreciate assistance - had ordered heparin gtt and amiodarone drip but patient converted back to NSR morning of 08/27/19 prior to initiation of drips; defer to cardiology - echo obtained on 7/10 prior to afib: EF 50-55%, Gr 1 DD - troponin x 1 negative on 7/10; denies any CP   DKA (diabetic ketoacidoses) ([place]), resolved Type 2 DM - uncontrolled - resolved with treatment; converted to pancreatitis protocol on 7/11 - Patient states he takes Metformin only at home(?recently started on Jiardiance) no previous use of insulin - Likely transition to insulin at discharge, diabetes coordinator following  Hypertension, essential -Use labetalol or hydralazine as  needed  Hyperlipidemia/hypertriglyceridemia - Last lipid panel reviewed from August 2019.  Triglycerides were over 1000 at that time - Patient takes Lipitor and fenofibrate at  home-add on fish oil 2 g twice daily - Follow-up lipid profile and trend every 12 hours while treating pancreatitis. Initial TG 8,286; continuing to downtrend; goal is TG<500 before discontinuing insulin infusion   Antimicrobials: N/AA  DVT prophylaxis: Lovenox Code Status: Full Family Communication: Mother via phone Disposition Plan:  . Patient came from: Home      . Barriers to d/c OR conditions which need to be met to effect Douglas safe d/c: None . The current disposition plan is discharge to: Home Status is: Inpatient  Remains inpatient appropriate because:Persistent severe electrolyte disturbances, IV treatments appropriate due to intensity of illness or inability to take PO and Inpatient level of care appropriate due to severity of illness   Dispo: The patient is from: Home              Anticipated d/c is to: Home              Anticipated d/c date is: > 3 days              Patient currently is not medically stable to d/c.  Objective: Blood pressure 108/65, pulse 91, temperature 98.6 F (37 C), temperature source Oral, resp. rate (!) 23, height 6' (1.829 m), weight 117.9 kg, SpO2 99 %.   Intake/Output Summary (Last 24 hours) at 08/30/2019 0717 Last data filed at 08/30/2019 0258 Gross per 24 hour  Intake 2112.07 ml  Output 1925 ml  Net 187.07 ml   Last Weight  Most recent update: 08/25/2019  5:30 AM   Weight  117.9 kg (260 lb)           Examination: General appearance: Douglas [name] resting in bed appearing much more comfortable this am Head: Normocephalic, without obvious abnormality Eyes: EOMI Lungs: clear to auscultation bilaterally Heart: regular rate and rhythm and S1, S2 normal Abdomen: soft, less TTP, BS hypoactive but better than yesterday, ND Extremities: no edema Skin: Skin color, texture, turgor normal. No rashes or lesions or mobility and turgor normal Neurologic: Grossly normal   Consultants:   Cardiology  Procedures:   n/Douglas  Data Reviewed: I have  personally reviewed following labs and imaging studies Results for orders placed or performed during the hospital encounter of 08/25/19 (from the past 24 hour(s))  Comprehensive metabolic panel     Status: Abnormal   Collection Time: 08/29/19  7:52 AM  Result Value Ref Range   Sodium 131 (L) 135 - 145 mmol/L   Potassium 2.8 (L) 3.5 - 5.1 mmol/L   Chloride 99 98 - 111 mmol/L   CO2 22 22 - 32 mmol/L   Glucose, Bld 64 (L) 70 - 99 mg/dL   BUN 8 6 - 20 mg/dL   Creatinine, Ser 0.82 0.61 - 1.24 mg/dL   Calcium 8.5 (L) 8.9 - 10.3 mg/dL   Total Protein 6.3 (L) 6.5 - 8.1 g/dL   Albumin 2.5 (L) 3.5 - 5.0 g/dL   AST 43 (H) 15 - 41 U/L   ALT 25 0 - 44 U/L   Alkaline Phosphatase 68 38 - 126 U/L   Total Bilirubin 0.7 0.3 - 1.2 mg/dL   GFR calc non Af Amer >60 >60 mL/min   GFR calc Af Amer >60 >60 mL/min   Anion gap 10 5 - 15  Glucose, capillary     Status: Abnormal  Collection Time: 08/29/19  8:01 AM  Result Value Ref Range   Glucose-Capillary 58 (L) 70 - 99 mg/dL  Glucose, capillary     Status: Abnormal   Collection Time: 08/29/19  9:03 AM  Result Value Ref Range   Glucose-Capillary 62 (L) 70 - 99 mg/dL  Glucose, capillary     Status: Abnormal   Collection Time: 08/29/19 10:10 AM  Result Value Ref Range   Glucose-Capillary 53 (L) 70 - 99 mg/dL  Glucose, capillary     Status: Abnormal   Collection Time: 08/29/19 11:00 AM  Result Value Ref Range   Glucose-Capillary 57 (L) 70 - 99 mg/dL  Glucose, capillary     Status: Abnormal   Collection Time: 08/29/19 11:38 AM  Result Value Ref Range   Glucose-Capillary 69 (L) 70 - 99 mg/dL  Comprehensive metabolic panel     Status: Abnormal   Collection Time: 08/29/19 12:00 PM  Result Value Ref Range   Sodium 130 (L) 135 - 145 mmol/L   Potassium 2.9 (L) 3.5 - 5.1 mmol/L   Chloride 99 98 - 111 mmol/L   CO2 19 (L) 22 - 32 mmol/L   Glucose, Bld 106 (H) 70 - 99 mg/dL   BUN 7 6 - 20 mg/dL   Creatinine, Ser 0.76 0.61 - 1.24 mg/dL   Calcium 8.4 (L)  8.9 - 10.3 mg/dL   Total Protein 6.1 (L) 6.5 - 8.1 g/dL   Albumin 2.5 (L) 3.5 - 5.0 g/dL   AST 44 (H) 15 - 41 U/L   ALT 25 0 - 44 U/L   Alkaline Phosphatase 76 38 - 126 U/L   Total Bilirubin 0.6 0.3 - 1.2 mg/dL   GFR calc non Af Amer >60 >60 mL/min   GFR calc Af Amer >60 >60 mL/min   Anion gap 12 5 - 15  Glucose, capillary     Status: None   Collection Time: 08/29/19 12:31 PM  Result Value Ref Range   Glucose-Capillary 95 70 - 99 mg/dL  Glucose, capillary     Status: Abnormal   Collection Time: 08/29/19  1:20 PM  Result Value Ref Range   Glucose-Capillary 131 (H) 70 - 99 mg/dL  Glucose, capillary     Status: Abnormal   Collection Time: 08/29/19  2:42 PM  Result Value Ref Range   Glucose-Capillary 130 (H) 70 - 99 mg/dL  Glucose, capillary     Status: Abnormal   Collection Time: 08/29/19  3:35 PM  Result Value Ref Range   Glucose-Capillary 187 (H) 70 - 99 mg/dL  Lipid panel     Status: Abnormal   Collection Time: 08/29/19  4:17 PM  Result Value Ref Range   Cholesterol 388 (H) 0 - 200 mg/dL   Triglycerides 600 (H) <150 mg/dL   HDL 20 (L) >40 mg/dL   Total CHOL/HDL Ratio 19.4 RATIO   VLDL UNABLE TO CALCULATE IF TRIGLYCERIDE OVER 400 mg/dL 0 - 40 mg/dL   LDL Cholesterol UNABLE TO CALCULATE IF TRIGLYCERIDE OVER 400 mg/dL 0 - 99 mg/dL  Comprehensive metabolic panel     Status: Abnormal   Collection Time: 08/29/19  4:17 PM  Result Value Ref Range   Sodium 128 (L) 135 - 145 mmol/L   Potassium 2.9 (L) 3.5 - 5.1 mmol/L   Chloride 99 98 - 111 mmol/L   CO2 21 (L) 22 - 32 mmol/L   Glucose, Bld 224 (H) 70 - 99 mg/dL   BUN 7 6 - 20 mg/dL   Creatinine,  Ser 0.87 0.61 - 1.24 mg/dL   Calcium 8.3 (L) 8.9 - 10.3 mg/dL   Total Protein 6.2 (L) 6.5 - 8.1 g/dL   Albumin 2.5 (L) 3.5 - 5.0 g/dL   AST 44 (H) 15 - 41 U/L   ALT 28 0 - 44 U/L   Alkaline Phosphatase 78 38 - 126 U/L   Total Bilirubin 0.6 0.3 - 1.2 mg/dL   GFR calc non Af Amer >60 >60 mL/min   GFR calc Af Amer >60 >60 mL/min    Anion gap 8 5 - 15  LDL cholesterol, direct     Status: Abnormal   Collection Time: 08/29/19  4:17 PM  Result Value Ref Range   Direct LDL 137.6 (H) 0 - 99 mg/dL  Glucose, capillary     Status: Abnormal   Collection Time: 08/29/19  4:24 PM  Result Value Ref Range   Glucose-Capillary 193 (H) 70 - 99 mg/dL  Glucose, capillary     Status: Abnormal   Collection Time: 08/29/19  5:31 PM  Result Value Ref Range   Glucose-Capillary 194 (H) 70 - 99 mg/dL  Glucose, capillary     Status: Abnormal   Collection Time: 08/29/19  6:29 PM  Result Value Ref Range   Glucose-Capillary 189 (H) 70 - 99 mg/dL  Glucose, capillary     Status: Abnormal   Collection Time: 08/29/19  7:45 PM  Result Value Ref Range   Glucose-Capillary 165 (H) 70 - 99 mg/dL  Comprehensive metabolic panel     Status: Abnormal   Collection Time: 08/29/19  8:00 PM  Result Value Ref Range   Sodium 129 (L) 135 - 145 mmol/L   Potassium 3.0 (L) 3.5 - 5.1 mmol/L   Chloride 99 98 - 111 mmol/L   CO2 21 (L) 22 - 32 mmol/L   Glucose, Bld 167 (H) 70 - 99 mg/dL   BUN 6 6 - 20 mg/dL   Creatinine, Ser 0.89 0.61 - 1.24 mg/dL   Calcium 8.4 (L) 8.9 - 10.3 mg/dL   Total Protein 6.4 (L) 6.5 - 8.1 g/dL   Albumin 2.6 (L) 3.5 - 5.0 g/dL   AST 43 (H) 15 - 41 U/L   ALT 29 0 - 44 U/L   Alkaline Phosphatase 81 38 - 126 U/L   Total Bilirubin 0.6 0.3 - 1.2 mg/dL   GFR calc non Af Amer >60 >60 mL/min   GFR calc Af Amer >60 >60 mL/min   Anion gap 9 5 - 15  Glucose, capillary     Status: Abnormal   Collection Time: 08/29/19  9:21 PM  Result Value Ref Range   Glucose-Capillary 160 (H) 70 - 99 mg/dL  Glucose, capillary     Status: Abnormal   Collection Time: 08/29/19 10:24 PM  Result Value Ref Range   Glucose-Capillary 159 (H) 70 - 99 mg/dL  Glucose, capillary     Status: Abnormal   Collection Time: 08/29/19 11:28 PM  Result Value Ref Range   Glucose-Capillary 156 (H) 70 - 99 mg/dL  Comprehensive metabolic panel     Status: Abnormal    Collection Time: 08/30/19 12:00 AM  Result Value Ref Range   Sodium 131 (L) 135 - 145 mmol/L   Potassium 2.9 (L) 3.5 - 5.1 mmol/L   Chloride 101 98 - 111 mmol/L   CO2 20 (L) 22 - 32 mmol/L   Glucose, Bld 157 (H) 70 - 99 mg/dL   BUN 6 6 - 20 mg/dL   Creatinine, Ser  0.69 0.61 - 1.24 mg/dL   Calcium 8.5 (L) 8.9 - 10.3 mg/dL   Total Protein 6.7 6.5 - 8.1 g/dL   Albumin 2.7 (L) 3.5 - 5.0 g/dL   AST 41 15 - 41 U/L   ALT 28 0 - 44 U/L   Alkaline Phosphatase 86 38 - 126 U/L   Total Bilirubin 0.8 0.3 - 1.2 mg/dL   GFR calc non Af Amer >60 >60 mL/min   GFR calc Af Amer >60 >60 mL/min   Anion gap 10 5 - 15  Glucose, capillary     Status: Abnormal   Collection Time: 08/30/19 12:28 AM  Result Value Ref Range   Glucose-Capillary 143 (H) 70 - 99 mg/dL  Glucose, capillary     Status: Abnormal   Collection Time: 08/30/19  1:38 AM  Result Value Ref Range   Glucose-Capillary 160 (H) 70 - 99 mg/dL  Glucose, capillary     Status: Abnormal   Collection Time: 08/30/19  2:47 AM  Result Value Ref Range   Glucose-Capillary 149 (H) 70 - 99 mg/dL  Comprehensive metabolic panel     Status: Abnormal   Collection Time: 08/30/19  4:06 AM  Result Value Ref Range   Sodium 132 (L) 135 - 145 mmol/L   Potassium 3.0 (L) 3.5 - 5.1 mmol/L   Chloride 102 98 - 111 mmol/L   CO2 21 (L) 22 - 32 mmol/L   Glucose, Bld 142 (H) 70 - 99 mg/dL   BUN 6 6 - 20 mg/dL   Creatinine, Ser 0.76 0.61 - 1.24 mg/dL   Calcium 8.7 (L) 8.9 - 10.3 mg/dL   Total Protein 6.4 (L) 6.5 - 8.1 g/dL   Albumin 2.6 (L) 3.5 - 5.0 g/dL   AST 38 15 - 41 U/L   ALT 27 0 - 44 U/L   Alkaline Phosphatase 84 38 - 126 U/L   Total Bilirubin 0.8 0.3 - 1.2 mg/dL   GFR calc non Af Amer >60 >60 mL/min   GFR calc Af Amer >60 >60 mL/min   Anion gap 9 5 - 15  CBC with Differential/Platelet     Status: Abnormal   Collection Time: 08/30/19  4:06 AM  Result Value Ref Range   WBC 8.7 4.0 - 10.5 K/uL   RBC 3.88 (L) 4.22 - 5.81 MIL/uL   Hemoglobin 10.8 (L)  13.0 - 17.0 g/dL   HCT 34.2 (L) 39 - 52 %   MCV 88.1 80.0 - 100.0 fL   MCH 27.8 26.0 - 34.0 pg   MCHC 31.6 30.0 - 36.0 g/dL   RDW 14.5 11.5 - 15.5 %   Platelets 247 150 - 400 K/uL   nRBC 0.0 0.0 - 0.2 %   Neutrophils Relative % 54 %   Neutro Abs 4.7 1.7 - 7.7 K/uL   Lymphocytes Relative 14 %   Lymphs Abs 1.2 0.7 - 4.0 K/uL   Monocytes Relative 16 %   Monocytes Absolute 1.4 (H) 0 - 1 K/uL   Eosinophils Relative 2 %   Eosinophils Absolute 0.1 0 - 0 K/uL   Basophils Relative 1 %   Basophils Absolute 0.1 0 - 0 K/uL   WBC Morphology MILD LEFT SHIFT (1-5% METAS, OCC MYELO, OCC BANDS)    Immature Granulocytes 13 %   Abs Immature Granulocytes 1.16 (H) 0.00 - 0.07 K/uL   Polychromasia PRESENT   Magnesium     Status: None   Collection Time: 08/30/19  4:06 AM  Result Value Ref Range  Magnesium 1.9 1.7 - 2.4 mg/dL  Phosphorus     Status: None   Collection Time: 08/30/19  4:06 AM  Result Value Ref Range   Phosphorus 3.5 2.5 - 4.6 mg/dL  Lipid panel     Status: Abnormal   Collection Time: 08/30/19  4:06 AM  Result Value Ref Range   Cholesterol 387 (H) 0 - 200 mg/dL   Triglycerides 581 (H) <150 mg/dL   HDL 19 (L) >40 mg/dL   Total CHOL/HDL Ratio 20.4 RATIO   VLDL UNABLE TO CALCULATE IF TRIGLYCERIDE OVER 400 mg/dL 0 - 40 mg/dL   LDL Cholesterol UNABLE TO CALCULATE IF TRIGLYCERIDE OVER 400 mg/dL 0 - 99 mg/dL  Lipase, blood     Status: Abnormal   Collection Time: 08/30/19  4:06 AM  Result Value Ref Range   Lipase 111 (H) 11 - 51 U/L  Glucose, capillary     Status: Abnormal   Collection Time: 08/30/19  4:11 AM  Result Value Ref Range   Glucose-Capillary 136 (H) 70 - 99 mg/dL    Recent Results (from the past 240 hour(s))  SARS Coronavirus 2 by RT PCR (hospital order, performed in [facility] hospital lab) Nasopharyngeal Nasopharyngeal Swab     Status: None   Collection Time: 08/25/19  5:44 AM   Specimen: Nasopharyngeal Swab  Result Value Ref Range Status   SARS Coronavirus 2  NEGATIVE NEGATIVE Final    Comment: (NOTE) SARS-CoV-2 target nucleic acids are NOT DETECTED.  The SARS-CoV-2 RNA is generally detectable in upper and lower respiratory specimens during the acute phase of infection. The lowest concentration of SARS-CoV-2 viral copies this assay can detect is 250 copies / mL. Douglas negative result does not preclude SARS-CoV-2 infection and should not be used as the sole basis for treatment or other patient management decisions.  Douglas negative result may occur with improper specimen collection / handling, submission of specimen other than nasopharyngeal swab, presence of viral mutation(s) within the areas targeted by this assay, and inadequate number of viral copies (<250 copies / mL). Douglas negative result must be combined with clinical observations, patient history, and epidemiological information.  Fact Sheet for Patients:   StrictlyIdeas.no  Fact Sheet for Healthcare Providers: BankingDealers.co.za  This test is not yet approved or  cleared by the [place] FDA and has been authorized for detection and/or diagnosis of SARS-CoV-2 by FDA under an Emergency Use Authorization (EUA).  This EUA will remain in effect (meaning this test can be used) for the duration of the COVID-19 declaration under Section 564(b)(1) of the Act, 21 U.S.C. section 360bbb-3(b)(1), unless the authorization is terminated or revoked sooner.  Performed at [facility], [place] [address], [place], [place] [address]   MRSA PCR Screening     Status: Abnormal   Collection Time: 08/25/19  7:31 PM   Specimen: Nasopharyngeal  Result Value Ref Range Status   MRSA by PCR POSITIVE ([place]) NEGATIVE Final    Comment:        The GeneXpert MRSA Assay (FDA approved for NASAL specimens only), is one component of Douglas comprehensive MRSA colonization surveillance program. It is not intended to diagnose MRSA infection nor to guide  or monitor treatment for MRSA infections. RESULT CALLED TO, READ BACK BY AND VERIFIED WITH: CHIP, EMILY @ 3419 08/26/2019 Rikki Spearing. Performed at [facility], [place] [address] [place], [place] [address]      Radiology Studies: DG Abd Portable 2V  Result Date: 08/28/2019 CLINICAL DATA:  Diffuse abdominal pain. EXAM: PORTABLE ABDOMEN - 2 VIEW COMPARISON:  07/28/2019 FINDINGS: Air-filled small bowel loops and scattered air-fluid levels. No significant distension. There is also moderate air and stool throughout the colon and down into the rectosigmoid area. Findings suggest constipation and small-bowel ileus related to the patient's pancreatitis. No findings to suggest obstruction or free air. IMPRESSION: Ileus bowel gas pattern and probable constipation. No findings to suggest small bowel obstruction or free air. Electronically Signed   By: Marijo Sanes M.D.   On: 08/28/2019 10:18   DG Abd Portable 2V  Final Result    DG Abd Portable 2V  Final Result    DG Abd Portable 2V  Final Result    [place] EKG SITE RITE  Final Result    CT ABDOMEN PELVIS W CONTRAST  Final Result    DG Chest Portable 1 View  Final Result       Scheduled Meds: . bisacodyl  10 mg Rectal Daily  . Chlorhexidine Gluconate Cloth  6 each Topical Daily  . icosapent Ethyl  2 g Oral BID  . metoprolol succinate  50 mg Oral Daily  . mupirocin ointment  1 application Nasal BID  . sodium chloride flush  10-40 mL Intracatheter Q12H   PRN Meds: acetaminophen **OR** acetaminophen, dextrose, hydrALAZINE, HYDROmorphone (DILAUDID) injection, ketorolac, labetalol, metoprolol tartrate, ondansetron (ZOFRAN) IV, prochlorperazine, sodium chloride flush Continuous Infusions: . dextrose 5 % and 0.45% NaCl 100 mL/hr at 08/30/19 0257  . insulin 4 Units/hr (08/29/19 2318)      LOS: 5 days  Time spent: 40 min  Little Ishikawa, DO [facility] 08/30/2019, 7:17 AM  Contact via secure chat.  To  contact the attending provider between 7P-7A, please log onto [facility]

## 2019-08-31 LAB — CBC
HCT: 34.3 % — ABNORMAL LOW (ref 39.0–52.0)
Hemoglobin: 10.8 g/dL — ABNORMAL LOW (ref 13.0–17.0)
MCH: 28 pg (ref 26.0–34.0)
MCHC: 31.5 g/dL (ref 30.0–36.0)
MCV: 88.9 fL (ref 80.0–100.0)
Platelets: 283 10*3/uL (ref 150–400)
RBC: 3.86 MIL/uL — ABNORMAL LOW (ref 4.22–5.81)
RDW: 14.2 % (ref 11.5–15.5)
WBC: 10.7 10*3/uL — ABNORMAL HIGH (ref 4.0–10.5)
nRBC: 0 % (ref 0.0–0.2)

## 2019-08-31 LAB — GLUCOSE, CAPILLARY
Glucose-Capillary: 103 mg/dL — ABNORMAL HIGH (ref 70–99)
Glucose-Capillary: 104 mg/dL — ABNORMAL HIGH (ref 70–99)
Glucose-Capillary: 108 mg/dL — ABNORMAL HIGH (ref 70–99)
Glucose-Capillary: 114 mg/dL — ABNORMAL HIGH (ref 70–99)
Glucose-Capillary: 116 mg/dL — ABNORMAL HIGH (ref 70–99)
Glucose-Capillary: 129 mg/dL — ABNORMAL HIGH (ref 70–99)
Glucose-Capillary: 135 mg/dL — ABNORMAL HIGH (ref 70–99)
Glucose-Capillary: 138 mg/dL — ABNORMAL HIGH (ref 70–99)
Glucose-Capillary: 68 mg/dL — ABNORMAL LOW (ref 70–99)
Glucose-Capillary: 72 mg/dL (ref 70–99)
Glucose-Capillary: 79 mg/dL (ref 70–99)
Glucose-Capillary: 80 mg/dL (ref 70–99)
Glucose-Capillary: 81 mg/dL (ref 70–99)
Glucose-Capillary: 95 mg/dL (ref 70–99)

## 2019-08-31 LAB — COMPREHENSIVE METABOLIC PANEL
ALT: 28 U/L (ref 0–44)
AST: 40 U/L (ref 15–41)
Albumin: 2.3 g/dL — ABNORMAL LOW (ref 3.5–5.0)
Alkaline Phosphatase: 86 U/L (ref 38–126)
Anion gap: 8 (ref 5–15)
BUN: <5 mg/dL — ABNORMAL LOW (ref 6–20)
CO2: 24 mmol/L (ref 22–32)
Calcium: 8.6 mg/dL — ABNORMAL LOW (ref 8.9–10.3)
Chloride: 102 mmol/L (ref 98–111)
Creatinine, Ser: 0.88 mg/dL (ref 0.61–1.24)
GFR calc Af Amer: >60 mL/min (ref >60)
GFR calc non Af Amer: >60 mL/min (ref >60)
Glucose, Bld: 73 mg/dL (ref 70–99)
Potassium: 2.8 mmol/L — ABNORMAL LOW (ref 3.5–5.1)
Sodium: 134 mmol/L — ABNORMAL LOW (ref 135–145)
Total Bilirubin: 0.7 mg/dL (ref 0.3–1.2)
Total Protein: 6.2 g/dL — ABNORMAL LOW (ref 6.5–8.1)

## 2019-08-31 LAB — LDL CHOLESTEROL, DIRECT: Direct LDL: 170.7 mg/dL — ABNORMAL HIGH (ref 0–99)

## 2019-08-31 LAB — LIPID PANEL
Cholesterol: 362 mg/dL — ABNORMAL HIGH (ref 0–200)
HDL: 19 mg/dL — ABNORMAL LOW (ref >40)
LDL Cholesterol: UNDETERMINED mg/dL (ref 0–99)
Total CHOL/HDL Ratio: 19.1 RATIO
Triglycerides: 470 mg/dL — ABNORMAL HIGH (ref <150)
VLDL: UNDETERMINED mg/dL (ref 0–40)

## 2019-08-31 MED ORDER — POTASSIUM CHLORIDE 20 MEQ PO PACK
40.0000 meq | PACK | Freq: Three times a day (TID) | ORAL | Status: AC
  Administered 2019-08-31 (×3): 40 meq via ORAL
  Filled 2019-08-31 (×3): qty 2

## 2019-08-31 MED ORDER — OXYCODONE-ACETAMINOPHEN 5-325 MG PO TABS
1.0000 | ORAL_TABLET | Freq: Four times a day (QID) | ORAL | Status: DC | PRN
  Administered 2019-08-31 – 2019-09-01 (×3): 1 via ORAL
  Filled 2019-08-31 (×4): qty 1

## 2019-08-31 MED ORDER — INSULIN ASPART 100 UNIT/ML ~~LOC~~ SOLN
0.0000 [IU] | Freq: Three times a day (TID) | SUBCUTANEOUS | Status: DC
  Administered 2019-08-31: 3 [IU] via SUBCUTANEOUS
  Administered 2019-09-01 (×2): 4 [IU] via SUBCUTANEOUS

## 2019-08-31 MED ORDER — INSULIN ASPART 100 UNIT/ML ~~LOC~~ SOLN: 0.0000 [IU] | Freq: Every day | SUBCUTANEOUS | Status: DC

## 2019-08-31 NOTE — Progress Notes (Signed)
PROGRESS NOTE    Douglas [name].   VZC:[id number]  DOB: 05/01/1985  DOA: 08/25/2019     6  PCP: Douglas Hose, NP  CC: N/V, abd pain  Hospital Course: Douglas [name] is a 34 year old African-American male with PMH obesity, hypertriglyceride induced pancreatitis, GERD, type 2 diabetes, hypertension, hyperlipidemia who presented to the hospital with severe abdominal pain, nausea, vomiting.  He stated the pain started around 2 AM and awoke him from sleep.  He has had ongoing nausea and vomiting throughout the day and presented for further evaluation. In the ER he underwent work-up including CT abdomen/pelvis which revealed diffuse peripancreatic edema, fat stranding, and free fluid around the pancreas consistent with acute edematous pancreatitis.  No pancreatic necrosis appreciated or abscess. He is admitted for further treatment of DKA and hyperTG induced pancreatitis. TG initially on workup was 8,286. He denies alcohol use.   He responded well to treatment of DKA. Care was then transitioned to focusing on treating his hypertriglyceridemia induced pancreatitis. Insulin infusion was changed to fixed dosing with titrating dextrose infusions. 08/26/19 he converted to afib with RVR and was given a cardizem bolus and persisted in afib with RVR but became hypotensive. He was asymptomatic and was started on oral lopressor. He converted spontaneously back to NSR on 08/27/19.  7/12, imagining starting showing signs of ileus development. He was started on dulcolax supp with and continued with ongoing imaging studies with moderate improvement in bowel regimen 7/15 -patient transitioned off insulin drip and D5 now on sliding scale insulin, advancing diet as tolerated    Subjective:  Patient indicates worsening blurry vision over the past 72 hours, previously not mention to myself or staff, otherwise denies chest pain, shortness of breath, nausea, vomiting, diarrhea, constipation, headache, fevers,  chills.  Assessment & Plan: Active Problems:   Acute pancreatitis   Hypertension   Type 2 diabetes mellitus without complication (HCC)   Hyperlipidemia   Atrial fibrillation with RVR (HCC)  Acute pancreatitis likely in the setting of hypertriglyceridemia - Patient denies alcohol use, - Triglycerides downtrending (8k at admission) but still markedly elevated:    Component Value Date/Time   CHOL 362 (H) 08/31/2019 0407   TRIG 470 (H) 08/31/2019 0407   HDL 19 (L) 08/31/2019 0407   CHOLHDL 19.1 08/31/2019 0407   VLDL UNABLE TO CALCULATE IF TRIGLYCERIDE OVER 400 mg/dL 08/31/2019 0407   LDLCALC UNABLE TO CALCULATE IF TRIGLYCERIDE OVER 400 mg/dL 08/31/2019 0407   LDLDIRECT 157.5 (H) 08/30/2019 0406  - Transition off insulin drip and D5, start sliding scale insulin and AC at bedtime coverage - Advance diet as tolerated - Continue supportive care  Atrial fibrillation with RVR ([place]), now rate controlled - no prior history of similar; etiology likely in setting of acute illness - cardiology consulted, appreciate assistance - had ordered heparin gtt and amiodarone drip but patient converted back to NSR morning of 08/27/19 prior to initiation of drips; defer to cardiology - echo obtained on 7/10 prior to afib: EF 50-55%, Gr 1 DD - troponin x 1 negative on 7/10; denies any CP   Vision changes, unspecified  -Not surprising given patient's A1c has been over 15 now for some 6 to 9 months -Reports worsening blurry vision, discussed case with Dr. Eulas Post ophthalmology recommending further outpatient workup and dedicated eye exam - vision changes likely in the setting of profound hyperglycemia now being well controlled as below.  DKA (diabetic ketoacidoses) ([place]), resolved Type 2 DM - uncontrolled - resolved with treatment;  converted to pancreatitis protocol on 7/11 - Patient states he takes Metformin only at home(?recently started on Jiardiance) no previous use of insulin - Likely transition to  insulin at discharge, diabetes coordinator following  Hypertension, essential -Use labetalol or hydralazine as needed  Hyperlipidemia/hypertriglyceridemia - Last lipid panel reviewed from August 2019.  Triglycerides were over 1000 at that time - Patient takes Lipitor and fenofibrate at home-add on fish oil 2 g twice daily - Follow-up lipid profile and trend every 12 hours while treating pancreatitis. Initial TG 8,286; continuing to downtrend; TG now less than 500 - transition off insulin/D5 as above   Antimicrobials: N/AA  DVT prophylaxis: Lovenox Code Status: Full Family Communication: Mother via phone Disposition Plan:  . Patient came from: Home      . Barriers to d/c OR conditions which need to be met to effect a safe d/c: None . The current disposition plan is discharge to: Home Status is: Inpatient  Remains inpatient appropriate because:Persistent severe electrolyte disturbances, IV treatments appropriate due to intensity of illness or inability to take PO and Inpatient level of care appropriate due to severity of illness   Dispo: The patient is from: Home              Anticipated d/c is to: Home              Anticipated d/c date is: 24 to 48 hours              Patient currently is not medically stable to d/c.  Ongoing need for close monitoring in the setting of resolving pancreatitis due to hypertriglyceridemia with profound hyperglycemia and DKA at admission.  Continues to have poor p.o. intake requiring IV fluids, likely disposition home pending tolerance of p.o. without worsening abdominal pain nausea vomiting.  Objective: Blood pressure 125/78, pulse 76, temperature 99.1 F (37.3 C), temperature source Oral, resp. rate (!) 21, height 6' (1.829 m), weight 117.9 kg, SpO2 99 %.   Intake/Output Summary (Last 24 hours) at 08/31/2019 0730 Last data filed at 08/31/2019 0500 Gross per 24 hour  Intake 3463.87 ml  Output 750 ml  Net 2713.87 ml   Last Weight  Most recent update:  08/25/2019  5:30 AM   Weight  117.9 kg (260 lb)           Examination: General:  Pleasantly resting in bed, No acute distress. HEENT:  Normocephalic atraumatic.  Sclerae nonicteric, noninjected.  Extraocular movements intact bilaterally. Neck:  Without mass or deformity.  Trachea is midline. Lungs:  Clear to auscultate bilaterally without rhonchi, wheeze, or rales. Heart:  Regular rate and rhythm.  Without murmurs, rubs, or gallops. Abdomen:  Soft, nontender, nondistended.  Without guarding or rebound. Extremities: Without cyanosis, clubbing, edema, or obvious deformity. Vascular:  Dorsalis pedis and posterior tibial pulses palpable bilaterally. Skin:  Warm and dry, no erythema, no ulcerations.  Consultants:   Cardiology  Procedures:   n/a  Data Reviewed: I have personally reviewed following labs and imaging studies Results for orders placed or performed during the hospital encounter of 08/25/19 (from the past 24 hour(s))  Glucose, capillary     Status: Abnormal   Collection Time: 08/30/19  7:31 AM  Result Value Ref Range   Glucose-Capillary 109 (H) 70 - 99 mg/dL  Glucose, capillary     Status: Abnormal   Collection Time: 08/30/19  8:41 AM  Result Value Ref Range   Glucose-Capillary 111 (H) 70 - 99 mg/dL  Comprehensive metabolic panel  Status: Abnormal   Collection Time: 08/30/19  9:25 AM  Result Value Ref Range   Sodium 133 (L) 135 - 145 mmol/L   Potassium 3.2 (L) 3.5 - 5.1 mmol/L   Chloride 101 98 - 111 mmol/L   CO2 21 (L) 22 - 32 mmol/L   Glucose, Bld 127 (H) 70 - 99 mg/dL   BUN 5 (L) 6 - 20 mg/dL   Creatinine, Ser 0.80 0.61 - 1.24 mg/dL   Calcium 8.8 (L) 8.9 - 10.3 mg/dL   Total Protein 6.4 (L) 6.5 - 8.1 g/dL   Albumin 2.4 (L) 3.5 - 5.0 g/dL   AST 39 15 - 41 U/L   ALT 27 0 - 44 U/L   Alkaline Phosphatase 83 38 - 126 U/L   Total Bilirubin 0.5 0.3 - 1.2 mg/dL   GFR calc non Af Amer >60 >60 mL/min   GFR calc Af Amer >60 >60 mL/min   Anion gap 11 5 - 15    Glucose, capillary     Status: Abnormal   Collection Time: 08/30/19  9:30 AM  Result Value Ref Range   Glucose-Capillary 113 (H) 70 - 99 mg/dL  Glucose, capillary     Status: Abnormal   Collection Time: 08/30/19 10:34 AM  Result Value Ref Range   Glucose-Capillary 196 (H) 70 - 99 mg/dL  Glucose, capillary     Status: Abnormal   Collection Time: 08/30/19 11:34 AM  Result Value Ref Range   Glucose-Capillary 172 (H) 70 - 99 mg/dL  Glucose, capillary     Status: Abnormal   Collection Time: 08/30/19 12:34 PM  Result Value Ref Range   Glucose-Capillary 190 (H) 70 - 99 mg/dL  Glucose, capillary     Status: Abnormal   Collection Time: 08/30/19  1:33 PM  Result Value Ref Range   Glucose-Capillary 179 (H) 70 - 99 mg/dL  Glucose, capillary     Status: Abnormal   Collection Time: 08/30/19  2:27 PM  Result Value Ref Range   Glucose-Capillary 195 (H) 70 - 99 mg/dL  Glucose, capillary     Status: Abnormal   Collection Time: 08/30/19  3:28 PM  Result Value Ref Range   Glucose-Capillary 146 (H) 70 - 99 mg/dL  Glucose, capillary     Status: Abnormal   Collection Time: 08/30/19  5:10 PM  Result Value Ref Range   Glucose-Capillary 136 (H) 70 - 99 mg/dL  Glucose, capillary     Status: Abnormal   Collection Time: 08/30/19  6:07 PM  Result Value Ref Range   Glucose-Capillary 121 (H) 70 - 99 mg/dL  Glucose, capillary     Status: Abnormal   Collection Time: 08/30/19  6:57 PM  Result Value Ref Range   Glucose-Capillary 118 (H) 70 - 99 mg/dL  Glucose, capillary     Status: Abnormal   Collection Time: 08/30/19  7:57 PM  Result Value Ref Range   Glucose-Capillary 135 (H) 70 - 99 mg/dL  Glucose, capillary     Status: Abnormal   Collection Time: 08/30/19  8:57 PM  Result Value Ref Range   Glucose-Capillary 112 (H) 70 - 99 mg/dL  Glucose, capillary     Status: None   Collection Time: 08/30/19  9:51 PM  Result Value Ref Range   Glucose-Capillary 84 70 - 99 mg/dL  Glucose, capillary     Status:  None   Collection Time: 08/30/19 11:01 PM  Result Value Ref Range   Glucose-Capillary 77 70 - 99 mg/dL  Glucose,  capillary     Status: Abnormal   Collection Time: 08/31/19 12:01 AM  Result Value Ref Range   Glucose-Capillary 104 (H) 70 - 99 mg/dL  Glucose, capillary     Status: Abnormal   Collection Time: 08/31/19  1:03 AM  Result Value Ref Range   Glucose-Capillary 116 (H) 70 - 99 mg/dL  Glucose, capillary     Status: Abnormal   Collection Time: 08/31/19  2:02 AM  Result Value Ref Range   Glucose-Capillary 108 (H) 70 - 99 mg/dL  Glucose, capillary     Status: None   Collection Time: 08/31/19  3:09 AM  Result Value Ref Range   Glucose-Capillary 95 70 - 99 mg/dL  Comprehensive metabolic panel     Status: Abnormal   Collection Time: 08/31/19  4:07 AM  Result Value Ref Range   Sodium 134 (L) 135 - 145 mmol/L   Potassium 2.8 (L) 3.5 - 5.1 mmol/L   Chloride 102 98 - 111 mmol/L   CO2 24 22 - 32 mmol/L   Glucose, Bld 73 70 - 99 mg/dL   BUN <5 (L) 6 - 20 mg/dL   Creatinine, Ser 0.88 0.61 - 1.24 mg/dL   Calcium 8.6 (L) 8.9 - 10.3 mg/dL   Total Protein 6.2 (L) 6.5 - 8.1 g/dL   Albumin 2.3 (L) 3.5 - 5.0 g/dL   AST 40 15 - 41 U/L   ALT 28 0 - 44 U/L   Alkaline Phosphatase 86 38 - 126 U/L   Total Bilirubin 0.7 0.3 - 1.2 mg/dL   GFR calc non Af Amer >60 >60 mL/min   GFR calc Af Amer >60 >60 mL/min   Anion gap 8 5 - 15  CBC     Status: Abnormal   Collection Time: 08/31/19  4:07 AM  Result Value Ref Range   WBC 10.7 (H) 4.0 - 10.5 K/uL   RBC 3.86 (L) 4.22 - 5.81 MIL/uL   Hemoglobin 10.8 (L) 13.0 - 17.0 g/dL   HCT 34.3 (L) 39 - 52 %   MCV 88.9 80.0 - 100.0 fL   MCH 28.0 26.0 - 34.0 pg   MCHC 31.5 30.0 - 36.0 g/dL   RDW 14.2 11.5 - 15.5 %   Platelets 283 150 - 400 K/uL   nRBC 0.0 0.0 - 0.2 %  Lipid panel     Status: Abnormal   Collection Time: 08/31/19  4:07 AM  Result Value Ref Range   Cholesterol 362 (H) 0 - 200 mg/dL   Triglycerides 470 (H) <150 mg/dL   HDL 19 (L) >40 mg/dL    Total CHOL/HDL Ratio 19.1 RATIO   VLDL UNABLE TO CALCULATE IF TRIGLYCERIDE OVER 400 mg/dL 0 - 40 mg/dL   LDL Cholesterol UNABLE TO CALCULATE IF TRIGLYCERIDE OVER 400 mg/dL 0 - 99 mg/dL  Glucose, capillary     Status: None   Collection Time: 08/31/19  4:14 AM  Result Value Ref Range   Glucose-Capillary 72 70 - 99 mg/dL  Glucose, capillary     Status: None   Collection Time: 08/31/19  5:00 AM  Result Value Ref Range   Glucose-Capillary 80 70 - 99 mg/dL  Glucose, capillary     Status: None   Collection Time: 08/31/19  6:13 AM  Result Value Ref Range   Glucose-Capillary 81 70 - 99 mg/dL  Glucose, capillary     Status: None   Collection Time: 08/31/19  6:53 AM  Result Value Ref Range   Glucose-Capillary 79 70 -  99 mg/dL    Recent Results (from the past 240 hour(s))  SARS Coronavirus 2 by RT PCR (hospital order, performed in [facility] hospital lab) Nasopharyngeal Nasopharyngeal Swab     Status: None   Collection Time: 08/25/19  5:44 AM   Specimen: Nasopharyngeal Swab  Result Value Ref Range Status   SARS Coronavirus 2 NEGATIVE NEGATIVE Final    Comment: (NOTE) SARS-CoV-2 target nucleic acids are NOT DETECTED.  The SARS-CoV-2 RNA is generally detectable in upper and lower respiratory specimens during the acute phase of infection. The lowest concentration of SARS-CoV-2 viral copies this assay can detect is 250 copies / mL. A negative result does not preclude SARS-CoV-2 infection and should not be used as the sole basis for treatment or other patient management decisions.  A negative result may occur with improper specimen collection / handling, submission of specimen other than nasopharyngeal swab, presence of viral mutation(s) within the areas targeted by this assay, and inadequate number of viral copies (<250 copies / mL). A negative result must be combined with clinical observations, patient history, and epidemiological information.  Fact Sheet for Patients:     StrictlyIdeas.no  Fact Sheet for Healthcare Providers: BankingDealers.co.za  This test is not yet approved or  cleared by the [place] FDA and has been authorized for detection and/or diagnosis of SARS-CoV-2 by FDA under an Emergency Use Authorization (EUA).  This EUA will remain in effect (meaning this test can be used) for the duration of the COVID-19 declaration under Section 564(b)(1) of the Act, 21 U.S.C. section 360bbb-3(b)(1), unless the authorization is terminated or revoked sooner.  Performed at [facility], [place] [address], [place], [place] [address]   MRSA PCR Screening     Status: Abnormal   Collection Time: 08/25/19  7:31 PM   Specimen: Nasopharyngeal  Result Value Ref Range Status   MRSA by PCR POSITIVE (A) NEGATIVE Final    Comment:        The GeneXpert MRSA Assay (FDA approved for NASAL specimens only), is one component of a comprehensive MRSA colonization surveillance program. It is not intended to diagnose MRSA infection nor to guide or monitor treatment for MRSA infections. RESULT CALLED TO, READ BACK BY AND VERIFIED WITH: CHIP, EMILY @ 4401 08/26/2019 Rikki Spearing. Performed at [facility], [place] [address] [place], [place] [address]      Radiology Studies: No results found. DG Abd Portable 2V  Final Result    DG Abd Portable 2V  Final Result    DG Abd Portable 2V  Final Result    [place] EKG SITE RITE  Final Result    CT ABDOMEN PELVIS W CONTRAST  Final Result    DG Chest Portable 1 View  Final Result       Scheduled Meds: . bisacodyl  10 mg Rectal Daily  . Chlorhexidine Gluconate Cloth  6 each Topical Daily  . icosapent Ethyl  2 g Oral BID  . metoprolol succinate  50 mg Oral Daily  . polyethylene glycol  17 g Oral Daily  . sodium chloride flush  10-40 mL Intracatheter Q12H   PRN Meds: acetaminophen **OR** acetaminophen, dextrose, hydrALAZINE,  HYDROmorphone (DILAUDID) injection, ketorolac, labetalol, metoprolol tartrate, ondansetron (ZOFRAN) IV, prochlorperazine, sodium chloride flush Continuous Infusions: . dextrose 5 % and 0.45% NaCl 100 mL/hr at 08/31/19 0500  . insulin 4 Units/hr (08/31/19 0500)      LOS: 6 days  Time spent: 40 min  Little Ishikawa, DO [facility] 08/31/2019, 7:30 AM  Contact via secure chat.  To contact the attending provider between 7P-7A, please log onto [facility]

## 2019-09-01 LAB — CBC
HCT: 34.4 % — ABNORMAL LOW (ref 39.0–52.0)
Hemoglobin: 10.8 g/dL — ABNORMAL LOW (ref 13.0–17.0)
MCH: 27.8 pg (ref 26.0–34.0)
MCHC: 31.4 g/dL (ref 30.0–36.0)
MCV: 88.4 fL (ref 80.0–100.0)
Platelets: 313 10*3/uL (ref 150–400)
RBC: 3.89 MIL/uL — ABNORMAL LOW (ref 4.22–5.81)
RDW: 14 % (ref 11.5–15.5)
WBC: 11.3 10*3/uL — ABNORMAL HIGH (ref 4.0–10.5)
nRBC: 0 % (ref 0.0–0.2)

## 2019-09-01 LAB — COMPREHENSIVE METABOLIC PANEL
ALT: 24 U/L (ref 0–44)
AST: 27 U/L (ref 15–41)
Albumin: 2.3 g/dL — ABNORMAL LOW (ref 3.5–5.0)
Alkaline Phosphatase: 84 U/L (ref 38–126)
Anion gap: 10 (ref 5–15)
BUN: 5 mg/dL — ABNORMAL LOW (ref 6–20)
CO2: 21 mmol/L — ABNORMAL LOW (ref 22–32)
Calcium: 8.8 mg/dL — ABNORMAL LOW (ref 8.9–10.3)
Chloride: 101 mmol/L (ref 98–111)
Creatinine, Ser: 0.78 mg/dL (ref 0.61–1.24)
GFR calc Af Amer: >60 mL/min (ref >60)
GFR calc non Af Amer: >60 mL/min (ref >60)
Glucose, Bld: 177 mg/dL — ABNORMAL HIGH (ref 70–99)
Potassium: 4.2 mmol/L (ref 3.5–5.1)
Sodium: 132 mmol/L — ABNORMAL LOW (ref 135–145)
Total Bilirubin: 0.6 mg/dL (ref 0.3–1.2)
Total Protein: 6.1 g/dL — ABNORMAL LOW (ref 6.5–8.1)

## 2019-09-01 LAB — LIPID PANEL
Cholesterol: 368 mg/dL — ABNORMAL HIGH (ref 0–200)
HDL: 18 mg/dL — ABNORMAL LOW (ref >40)
LDL Cholesterol: UNDETERMINED mg/dL (ref 0–99)
Total CHOL/HDL Ratio: 20.4 RATIO
Triglycerides: 498 mg/dL — ABNORMAL HIGH (ref <150)
VLDL: UNDETERMINED mg/dL (ref 0–40)

## 2019-09-01 LAB — GLUCOSE, CAPILLARY
Glucose-Capillary: 189 mg/dL — ABNORMAL HIGH (ref 70–99)
Glucose-Capillary: 153 mg/dL — ABNORMAL HIGH (ref 70–99)

## 2019-09-01 LAB — LDL CHOLESTEROL, DIRECT: Direct LDL: 180.4 mg/dL — ABNORMAL HIGH (ref 0–99)

## 2019-09-01 MED ORDER — ATORVASTATIN CALCIUM 80 MG PO TABS: 80.0000 mg | ORAL_TABLET | Freq: Every day | ORAL | 0 refills | Status: DC

## 2019-09-01 MED ORDER — INSULIN GLARGINE 100 UNIT/ML ~~LOC~~ SOLN
10.0000 [IU] | Freq: Every day | SUBCUTANEOUS | Status: DC
  Administered 2019-09-01: 10 [IU] via SUBCUTANEOUS
  Filled 2019-09-01: qty 0.1

## 2019-09-01 MED ORDER — LIVING WELL WITH DIABETES BOOK
Freq: Once | Status: DC
  Filled 2019-09-01: qty 1

## 2019-09-01 MED ORDER — INSULIN STARTER KIT- PEN NEEDLES (ENGLISH): 1.0000 | Freq: Once | 0 refills | Status: AC

## 2019-09-01 MED ORDER — ATORVASTATIN CALCIUM 40 MG PO TABS
80.0000 mg | ORAL_TABLET | Freq: Every day | ORAL | Status: DC
  Administered 2019-09-01: 80 mg via ORAL
  Filled 2019-09-01: qty 2

## 2019-09-01 MED ORDER — BLOOD GLUCOSE MONITOR KIT: PACK | 0 refills | Status: DC

## 2019-09-01 MED ORDER — INSULIN STARTER KIT- PEN NEEDLES (ENGLISH)
1.0000 | Freq: Once | Status: DC
  Filled 2019-09-01: qty 1

## 2019-09-01 MED ORDER — INSULIN GLARGINE 100 UNIT/ML SOLOSTAR PEN
10.0000 [IU] | PEN_INJECTOR | Freq: Every day | SUBCUTANEOUS | 0 refills | Status: DC
Start: 2019-09-01 — End: 2022-05-13

## 2019-09-01 MED ORDER — FENOFIBRATE 160 MG PO TABS
160.0000 mg | ORAL_TABLET | Freq: Every day | ORAL | Status: DC
  Administered 2019-09-01: 160 mg via ORAL
  Filled 2019-09-01: qty 1

## 2019-09-01 MED ORDER — METOPROLOL SUCCINATE ER 50 MG PO TB24: 50.0000 mg | ORAL_TABLET | Freq: Every day | ORAL | 0 refills | Status: DC

## 2019-09-01 NOTE — Progress Notes (Signed)
Nutrition Follow-up  DOCUMENTATION CODES:   Obesity unspecified  INTERVENTION:  - handouts provided.   NUTRITION DIAGNOSIS:   Increased nutrient needs related to acute illness as evidenced by estimated needs. -revised  GOAL:   Patient will meet greater than or equal to 90% of their needs -improving  MONITOR:   PO intake, Labs, Weight trends  ASSESSMENT:   34 year old African-American male with PMH obesity, hypertriglyceride induced pancreatitis, GERD, type 2 diabetes, hypertension, hyperlipidemia who presented to the hospital with severe abdominal pain, nausea, vomiting. CT abdomen/pelvis which revealed diffuse peripancreatic edema, fat stranding, and free fluid around the pancreas consistent with acute edematous pancreatitis.  Diet advanced from NPO to CLD on 7/13, to FLD on 7/15, and to Heart Healthy/Carb Mod later in the day on 7/15. No intakes documented since advancement began.   Able to talk with patient and his wife at bedside after DM Coordinator made RD aware that they had severe food/nutrition-related questions.   Provided handouts from the Academy of [employer] Dietetics: "Diabetes Label Reading Tips" and "Carbohydrate Counting for People with Diabetes."   Patient does look at labels at home and mainly looks at the carb and sugar content of foods. He had previously been considering weight reduction surgery, but that this is no longer being pursued. He has been making changes in the foods he eats such as selecting pastas that are partly made of veggies.   Discussed carb limit/meal, what to eat when he has reached this limit and is still hungry, and answered specific questions about cereals and salad dressings.   At the end of visit, patient and wife denied having any further questions or concerns and were appreciative of RD visit and information.   Labs reviewed; CBGs: 189 and 153 mg/dl, Na: 409 mmol/l, BUN: 5 mg/dl. Medications reviewed; sliding scale novolog, 10  units lantus/day, 17 g miralax/day, 40 mEq Klor-Con x3 doses 7/15.    NUTRITION - FOCUSED PHYSICAL EXAM:  completed; no muscle or fat wasting, no edema noted.  Diet Order:   Diet Order            Diet heart healthy/carb modified Room service appropriate? Yes; Fluid consistency: Thin  Diet effective now                 EDUCATION NEEDS:   Education needs have been addressed  Skin:  Skin Assessment: Reviewed RN Assessment  Last BM:  7/15  Height:   Ht Readings from Last 1 Encounters:  08/25/19 6' (1.829 m)    Weight:   Wt Readings from Last 1 Encounters:  08/25/19 117.9 kg     Estimated Nutritional Needs:  Kcal:  2150-2350 Protein:  115-130 grams Fluid:  >/= 2 L/day     Trenton Gammon, MS, RD, LDN, CNSC Inpatient Clinical Dietitian RD pager # available in [facility]  After hours/weekend pager # available in [facility]

## 2019-09-01 NOTE — Discharge Summary (Signed)
Physician Discharge Summary  Douglas [name]. OEV:[id number] DOB: 06-29-1985 DOA: 08/25/2019  PCP: Tomasa Hose, NP  Admit date: 08/25/2019 Discharge date: 09/01/2019  Admitted From: Home Disposition: Home  Recommendations for Outpatient Follow-up:  1. Follow up with PCP in 1-2 weeks 2. Please obtain BMP/CBC in one week 3. Please follow up on the following pending results:  Discharge Condition: Stable CODE STATUS: Full Diet recommendation: Diabetic diet  Brief/Interim Summary: Mr. Delay is a 34 year old African-American male with PMH obesity, hypertriglyceride induced pancreatitis, GERD, type 2 diabetes, hypertension, hyperlipidemia who presented to the hospital with severe abdominal pain, nausea, vomiting.  He stated the pain started around 2 AM and awoke him from sleep.  He has had ongoing nausea and vomiting throughout the day and presented for further evaluation. In the ER he underwent work-up including CT abdomen/pelvis which revealed diffuse peripancreatic edema, fat stranding, and free fluid around the pancreas consistent with acute edematous pancreatitis.  No pancreatic necrosis appreciated or abscess. He is admitted for further treatment of DKA and hyperTG induced pancreatitis. TG initially on workup was 8,286. He denies alcohol use.   He responded well to treatment of DKA. Care was then transitioned to focusing on treating his hypertriglyceridemia induced pancreatitis. Insulin infusion was changed to fixed dosing with titrating dextrose infusions. 08/26/19 he converted to afib with RVR and was given a cardizem bolus and persisted in afib with RVR but became hypotensive. He was asymptomatic and was started on oral lopressor. He converted spontaneously back to NSR on 08/27/19.  On 08/31/2019 patient was able to be weaned off insulin drip and D5 with improvement in triglyceride levels, pancreatitis and glucose remaining essentially within normal limits. Patient has been able to  tolerate p.o. quite appropriately, lengthy bedside education about insulin use administration and need for close monitoring given A1c greater than 15.5 here.  Patient incidentally noted to have blurry vision over the past few days, ophthalmology consulted recommending outpatient follow-up, likely in the setting of severe uncontrolled hyperglycemia over the past few months.  Wife at bedside and patient agree for further close outpatient follow up with PCP ophthalmology and possibly endocrinology pending glucose control, otherwise stable and agreeable for discharge home.   Discharge Diagnoses:  Active Problems:   Acute pancreatitis   Hypertension   Type 2 diabetes mellitus without complication (HCC)   Hyperlipidemia   Atrial fibrillation with RVR [facility])    Discharge Instructions  Discharge Instructions    Call MD for:  difficulty breathing, headache or visual disturbances   Complete by: As directed    Call MD for:  extreme fatigue   Complete by: As directed    Call MD for:  hives   Complete by: As directed    Call MD for:  persistant dizziness or light-headedness   Complete by: As directed    Call MD for:  persistant nausea and vomiting   Complete by: As directed    Call MD for:  severe uncontrolled pain   Complete by: As directed    Call MD for:  temperature >100.4   Complete by: As directed    Diet Carb Modified   Complete by: As directed    Increase activity slowly   Complete by: As directed      Allergies as of 09/01/2019   No Known Allergies     Medication List    STOP taking these medications   glipiZIDE 5 MG tablet Commonly known as: GLUCOTROL   hydrochlorothiazide 25 MG tablet Commonly  known as: HYDRODIURIL     TAKE these medications   atorvastatin 80 MG tablet Commonly known as: LIPITOR Take 1 tablet (80 mg total) by mouth daily at 6 PM. Start taking on: September 02, 2019 What changed:   medication strength  how much to take   blood glucose meter kit and  supplies Dispense based on patient and insurance preference. Use up to four times daily as directed. (FOR ICD-10 E10.9, E11.9).   blood glucose meter kit and supplies Kit Dispense based on patient and insurance preference. Use up to four times daily as directed. (FOR ICD-9 250.00, 250.01).   fenofibrate 160 MG tablet Take 160 mg by mouth daily.   ibuprofen 800 MG tablet Commonly known as: ADVIL Take 800 mg by mouth daily as needed for pain.   insulin glargine 100 UNIT/ML Solostar Pen Commonly known as: LANTUS Inject 10 Units into the skin daily.   insulin starter kit- pen needles Misc 1 kit by Other route once for 1 dose.   metFORMIN 500 MG tablet Commonly known as: GLUCOPHAGE Take 2 tablets in the AM and 1 tablet in the PM What changed: Another medication with the same name was removed. Continue taking this medication, and follow the directions you see here.   metoprolol succinate 50 MG 24 hr tablet Commonly known as: TOPROL-XL Take 1 tablet (50 mg total) by mouth daily. Start taking on: September 02, 2019   pantoprazole 40 MG tablet Commonly known as: PROTONIX Take 40 mg by mouth daily.       No Known Allergies  Consultations: Cardiology  Procedures/Studies: CT ABDOMEN PELVIS W CONTRAST  Result Date: 08/25/2019 CLINICAL DATA:  Epigastric pain. History of pancreatitis. Elevated lipase. EXAM: CT ABDOMEN AND PELVIS WITH CONTRAST TECHNIQUE: Multidetector CT imaging of the abdomen and pelvis was performed using the standard protocol following bolus administration of intravenous contrast. CONTRAST:  113m OMNIPAQUE IOHEXOL 300 MG/ML  SOLN COMPARISON:  CT 09/18/2017 FINDINGS: Lower chest: No consolidation or pleural fluid. Hepatobiliary: Mild diffuse steatosis without evidence of focal lesion. Physiologically distended gallbladder. No calcified gallstone. Common bile duct is not well-defined but no evidence of biliary dilatation. Pancreas: Diffuse peripancreatic edema, fat  stranding, and free fluid about the entirety of the pancreas and retroperitoneum. Homogeneous pancreatic enhancement without evidence of necrosis. Non organized free fluid tracks into the upper abdomen, right pericolic gutter car lesser sac and retroperitoneum without organized fluid collection. No soft tissue air. Splenic vein is patent. Spleen: Normal in size without focal abnormality. Adrenals/Urinary Tract: Normal adrenal glands. No hydronephrosis or perinephric edema. There is slight prominence of the right renal collecting system and ureter which is likely reactive. No obstructive change. Homogeneous renal enhancement with symmetric excretion on delayed phase imaging. Urinary bladder is physiologically distended without wall thickening. Stomach/Bowel: Intraluminal fluid in the stomach. Mild mucosal enhancement involving the peri pyloric stomach and duodenum without obvious wall thickening, likely reactive. There is no small bowel dilatation, inflammation or obstruction. High-riding cecum in the right mid abdomen. Normal appendix courses in the right lower quadrant. Small to moderate volume of colonic stool. There is no colonic wall thickening or inflammation. Vascular/Lymphatic: Normal caliber abdominal aorta. Patent portal and splenic veins. No acute vascular findings. No abdominopelvic adenopathy. Reproductive: Prostate is unremarkable. Other: Inflammatory changes stranding and non organized free fluid in the upper abdomen related to pancreatic inflammation. Free fluid tracks into the upper abdomen, right upper quadrant, right pericolic gutter, retroperitoneum, and lesser sac. Small amount of free fluid is tract  into the pelvis. No organized fluid collection. No free air. Tiny fat containing umbilical hernia. Musculoskeletal: Chronic bilateral L5 pars interarticularis defects. No significant listhesis. There are no acute or suspicious osseous abnormalities. IMPRESSION: 1. Acute edematous pancreatitis.  Significant peripancreatic inflammatory changes. No evidence of pancreatic necrosis or acute peripancreatic fluid collection. 2. Mild hepatic steatosis. 3. Chronic bilateral L5 pars interarticularis defects without significant listhesis. Electronically Signed   By: Keith Rake M.D.   On: 08/25/2019 16:34   DG Chest Portable 1 View  Result Date: 08/25/2019 CLINICAL DATA:  Cough. EXAM: PORTABLE CHEST 1 VIEW COMPARISON:  No prior. FINDINGS: Mediastinum hilar structures normal. Heart size normal. No focal infiltrate. No pleural effusion or pneumothorax. No acute bony abnormality. IMPRESSION: No acute cardiopulmonary disease. Electronically Signed   By: Marcello Moores  Register   On: 08/25/2019 06:19   DG Abd Portable 2V  Result Date: 08/28/2019 CLINICAL DATA:  Diffuse abdominal pain. EXAM: PORTABLE ABDOMEN - 2 VIEW COMPARISON:  07/28/2019 FINDINGS: Air-filled small bowel loops and scattered air-fluid levels. No significant distension. There is also moderate air and stool throughout the colon and down into the rectosigmoid area. Findings suggest constipation and small-bowel ileus related to the patient's pancreatitis. No findings to suggest obstruction or free air. IMPRESSION: Ileus bowel gas pattern and probable constipation. No findings to suggest small bowel obstruction or free air. Electronically Signed   By: Marijo Sanes M.D.   On: 08/28/2019 10:18   DG Abd Portable 2V  Result Date: 08/27/2019 CLINICAL DATA:  Pain EXAM: PORTABLE ABDOMEN - 2 VIEW COMPARISON:  August 26, 2019 FINDINGS: The bowel gas pattern is normal. There is no evidence of free air. No radio-opaque calculi or other significant radiographic abnormality is seen. There is a moderate amount of stool. IMPRESSION: Nonobstructive bowel gas pattern.  Moderate stool burden. Electronically Signed   By: Constance Holster M.D.   On: 08/27/2019 15:44   DG Abd Portable 2V  Result Date: 08/26/2019 CLINICAL DATA:  Abdominal pain.  Pancreatitis. EXAM:  PORTABLE ABDOMEN - 2 VIEW COMPARISON:  CT abdomen and pelvis August 25, 2019 FINDINGS: Supine and upright images were obtained. There is moderate stool in the colon. There is no bowel dilatation or air-fluid level to suggest bowel obstruction. No free air. Lung bases are clear. IMPRESSION: Moderate stool in colon. No bowel obstruction or free air. Lung bases are clear. Electronically Signed   By: Lowella Grip III M.D.   On: 08/26/2019 12:19   ECHOCARDIOGRAM COMPLETE  Result Date: 08/26/2019    ECHOCARDIOGRAM REPORT   Patient Name:   Carvell Hoeffner. Date of Exam: 08/26/2019 Medical Rec #:  [id number]               Height:       72.0 in Accession #:    [id number]              Weight:       260.0 lb Date of Birth:  03-28-1985               BSA:          2.382 m Patient Age:    34 years                BP:           137/76 mmHg Patient Gender: M                       HR:  109 bpm. Exam Location:  Inpatient Procedure: 2D Echo Indications:    Abnormal ECG R94.31  History:        Patient has no prior history of Echocardiogram examinations.                 Risk Factors:Hypertension and Diabetes.  Sonographer:    Mikki Santee RDCS (AE) Referring Phys: [place]  1. Abnormal septal motion . Left ventricular ejection fraction, by estimation, is 50 to 55%. The left ventricle has low normal function. The left ventricle has no regional wall motion abnormalities. There is mild left ventricular hypertrophy. Left ventricular diastolic parameters are consistent with Grade I diastolic dysfunction (impaired relaxation).  2. Right ventricular systolic function is normal. The right ventricular size is normal.  3. The mitral valve is normal in structure. No evidence of mitral valve regurgitation. No evidence of mitral stenosis.  4. The aortic valve is tricuspid. Aortic valve regurgitation is not visualized. No aortic stenosis is present.  5. The inferior vena cava is normal in size with  greater than 50% respiratory variability, suggesting right atrial pressure of 3 mmHg. FINDINGS  Left Ventricle: Abnormal septal motion. Left ventricular ejection fraction, by estimation, is 50 to 55%. The left ventricle has low normal function. The left ventricle has no regional wall motion abnormalities. The left ventricular internal cavity size was normal in size. There is mild left ventricular hypertrophy. Left ventricular diastolic parameters are consistent with Grade I diastolic dysfunction (impaired relaxation). Right Ventricle: The right ventricular size is normal. No increase in right ventricular wall thickness. Right ventricular systolic function is normal. Left Atrium: Left atrial size was normal in size. Right Atrium: Right atrial size was normal in size. Pericardium: There is no evidence of pericardial effusion. Mitral Valve: The mitral valve is normal in structure. Normal mobility of the mitral valve leaflets. No evidence of mitral valve regurgitation. No evidence of mitral valve stenosis. Tricuspid Valve: The tricuspid valve is normal in structure. Tricuspid valve regurgitation is not demonstrated. No evidence of tricuspid stenosis. Aortic Valve: The aortic valve is tricuspid. Aortic valve regurgitation is not visualized. No aortic stenosis is present. Pulmonic Valve: The pulmonic valve was normal in structure. Pulmonic valve regurgitation is not visualized. No evidence of pulmonic stenosis. Aorta: The aortic root is normal in size and structure. Venous: The inferior vena cava is normal in size with greater than 50% respiratory variability, suggesting right atrial pressure of 3 mmHg. IAS/Shunts: No atrial level shunt detected by color flow Doppler.  LEFT VENTRICLE PLAX 2D LVIDd:         4.20 cm  Diastology LVIDs:         2.80 cm  LV e' lateral:   9.68 cm/s LV PW:         1.50 cm  LV E/e' lateral: 5.7 LV IVS:        1.20 cm  LV e' medial:    8.92 cm/s LVOT diam:     2.50 cm  LV E/e' medial:  6.2 LV SV:          61 LV SV Index:   26 LVOT Area:     4.91 cm  RIGHT VENTRICLE RV S prime:     26.10 cm/s TAPSE (M-mode): 2.0 cm LEFT ATRIUM             Index      RIGHT ATRIUM           Index LA diam:  3.20 cm 1.34 cm/m RA Area:     11.80 cm LA Vol (A2C):   22.1 ml 9.28 ml/m RA Volume:   22.80 ml  9.57 ml/m LA Vol (A4C):   18.3 ml 7.68 ml/m LA Biplane Vol: 21.2 ml 8.90 ml/m  AORTIC VALVE LVOT Vmax:   77.70 cm/s LVOT Vmean:  50.100 cm/s LVOT VTI:    0.125 m  AORTA Ao Root diam: 3.20 cm MITRAL VALVE MV Area (PHT): 3.42 cm    SHUNTS MV Decel Time: 222 msec    Systemic VTI:  0.12 m MV E velocity: 55.20 cm/s  Systemic Diam: 2.50 cm MV A velocity: 62.70 cm/s MV E/A ratio:  0.88 Jenkins Rouge MD Electronically signed by Jenkins Rouge MD Signature Date/Time: 08/26/2019/3:16:03 PM    Final    [place] EKG SITE RITE  Result Date: 08/26/2019 If Site Rite image not attached, placement could not be confirmed due to current cardiac rhythm.    Subjective: No acute issues or events overnight, denies nausea, vomiting, diarrhea, constipation, headache, fevers, chills.   Discharge Exam: Vitals:   09/01/19 0800 09/01/19 0817  BP: 114/66 114/66  Pulse: 82   Resp: (!) 24   Temp: 98.8 F (37.1 C)   SpO2: (!) 89%    Vitals:   09/01/19 0500 09/01/19 0600 09/01/19 0800 09/01/19 0817  BP:  130/80 114/66 114/66  Pulse: 82 85 82   Resp: (!) 26 (!) 21 (!) 24   Temp:   98.8 F (37.1 C)   TempSrc:   Oral   SpO2: 96% 99% (!) 89%   Weight:      Height:        General: Pt is alert, awake, not in acute distress Cardiovascular: RRR, S1/S2 +, no rubs, no gallops Respiratory: CTA bilaterally, no wheezing, no rhonchi Abdominal: Soft, NT, ND, bowel sounds + Extremities: no edema, no cyanosis    The results of significant diagnostics from this hospitalization (including imaging, microbiology, ancillary and laboratory) are listed below for reference.     Microbiology: Recent Results (from the past 240 hour(s))  SARS  Coronavirus 2 by RT PCR (hospital order, performed in [facility] hospital lab) Nasopharyngeal Nasopharyngeal Swab     Status: None   Collection Time: 08/25/19  5:44 AM   Specimen: Nasopharyngeal Swab  Result Value Ref Range Status   SARS Coronavirus 2 NEGATIVE NEGATIVE Final    Comment: (NOTE) SARS-CoV-2 target nucleic acids are NOT DETECTED.  The SARS-CoV-2 RNA is generally detectable in upper and lower respiratory specimens during the acute phase of infection. The lowest concentration of SARS-CoV-2 viral copies this assay can detect is 250 copies / mL. A negative result does not preclude SARS-CoV-2 infection and should not be used as the sole basis for treatment or other patient management decisions.  A negative result may occur with improper specimen collection / handling, submission of specimen other than nasopharyngeal swab, presence of viral mutation(s) within the areas targeted by this assay, and inadequate number of viral copies (<250 copies / mL). A negative result must be combined with clinical observations, patient history, and epidemiological information.  Fact Sheet for Patients:   StrictlyIdeas.no  Fact Sheet for Healthcare Providers: BankingDealers.co.za  This test is not yet approved or  cleared by the [place] FDA and has been authorized for detection and/or diagnosis of SARS-CoV-2 by FDA under an Emergency Use Authorization (EUA).  This EUA will remain in effect (meaning this test can be used) for the duration of the  COVID-19 declaration under Section 564(b)(1) of the Act, 21 U.S.C. section 360bbb-3(b)(1), unless the authorization is terminated or revoked sooner.  Performed at [facility], [place] [address] [place], [place] [address]   MRSA PCR Screening     Status: Abnormal   Collection Time: 08/25/19  7:31 PM   Specimen: Nasopharyngeal  Result Value Ref Range Status   MRSA by PCR  POSITIVE (A) NEGATIVE Final    Comment:        The GeneXpert MRSA Assay (FDA approved for NASAL specimens only), is one component of a comprehensive MRSA colonization surveillance program. It is not intended to diagnose MRSA infection nor to guide or monitor treatment for MRSA infections. RESULT CALLED TO, READ BACK BY AND VERIFIED WITH: CHIP, EMILY @ 9563 08/26/2019 Rikki Spearing. Performed at [facility], [place] [address], [place], [place] [address]      Labs: BNP (last 3 results) No results for input(s): BNP in the last 8760 hours. Basic Metabolic Panel: Recent Labs  Lab 08/26/19 0753 08/26/19 1126 08/27/19 0336 08/27/19 0800 08/28/19 0436 08/28/19 0927 08/29/19 0412 08/29/19 0752 08/30/19 0000 08/30/19 0406 08/30/19 0925 08/31/19 0407 09/01/19 0400  NA 130*   < > 134*   133*   < >  --    < > 131*   < > 131* 132* 133* 134* 132*  K 6.8*   < > 4.0   4.0   < >  --    < > 2.9*   < > 2.9* 3.0* 3.2* 2.8* 4.2  CL 103   < > 105   104   < >  --    < > 100   < > 101 102 101 102 101  CO2 12*   < > 15*   16*   < >  --    < > 19*   < > 20* 21* 21* 24 21*  GLUCOSE 215*   < > 161*   165*   < >  --    < > 67*   < > 157* 142* 127* 73 177*  BUN 15   < > 16   16   < >  --    < > 8   < > 6 6 5* <5* 5*  CREATININE 1.35*   < > 1.15   1.20   < >  --    < > 0.79   < > 0.69 0.76 0.80 0.88 0.78  CALCIUM 7.1*   < > 7.8*   7.9*   < >  --    < > 8.7*   < > 8.5* 8.7* 8.8* 8.6* 8.8*  MG 2.4  --  2.3  --  2.0  --  2.0  --   --  1.9  --   --   --   PHOS 1.9*  --  1.6*  --  2.4*  --  2.5  --   --  3.5  --   --   --    < > = values in this interval not displayed.   Liver Function Tests: Recent Labs  Lab 08/30/19 0000 08/30/19 0406 08/30/19 0925 08/31/19 0407 09/01/19 0400  AST 41 38 39 40 27  ALT 28 27 27 28 24   ALKPHOS 86 84 83 86 84  BILITOT 0.8 0.8 0.5 0.7 0.6  PROT 6.7 6.4* 6.4* 6.2* 6.1*  ALBUMIN 2.7* 2.6* 2.4* 2.3* 2.3*   Recent Labs  Lab 08/26/19  4496  08/27/19 0336 08/28/19 0436 08/29/19 0412 08/30/19 0406  LIPASE 350* 317* 169* 122* 111*   No results for input(s): AMMONIA in the last 168 hours. CBC: Recent Labs  Lab 08/26/19 0249 08/26/19 0249 08/27/19 0336 08/27/19 0336 08/28/19 0436 08/29/19 0412 08/30/19 0406 08/31/19 0407 09/01/19 0400  WBC 3.3*   < > 6.4   < > 7.6 9.2 8.7 10.7* 11.3*  NEUTROABS 1.9  --  4.4  --  5.5 5.3 4.7  --   --   HGB 17.8*   < > 15.4   < > 12.8* 11.9* 10.8* 10.8* 10.8*  HCT 53.6*   < > 47.2   < > 39.8 36.8* 34.2* 34.3* 34.4*  MCV 87.6   < > 87.7   < > 89.4 87.6 88.1 88.9 88.4  PLT 261   < > 246   < > 215 248 247 283 313   < > = values in this interval not displayed.   Cardiac Enzymes: No results for input(s): CKTOTAL, CKMB, CKMBINDEX, TROPONINI in the last 168 hours. BNP: Invalid input(s): POCBNP CBG: Recent Labs  Lab 08/31/19 1253 08/31/19 1641 08/31/19 1957 09/01/19 0747 09/01/19 1215  GLUCAP 114* 138* 129* 189* 153*   D-Dimer No results for input(s): DDIMER in the last 72 hours. Hgb A1c No results for input(s): HGBA1C in the last 72 hours. Lipid Profile Recent Labs    08/31/19 0407 09/01/19 0400  CHOL 362* 368*  HDL 19* 18*  LDLCALC UNABLE TO CALCULATE IF TRIGLYCERIDE OVER 400 mg/dL UNABLE TO CALCULATE IF TRIGLYCERIDE OVER 400 mg/dL  TRIG 470* 498*  CHOLHDL 19.1 20.4  LDLDIRECT 170.7* 180.4*   Thyroid function studies No results for input(s): TSH, T4TOTAL, T3FREE, THYROIDAB in the last 72 hours.  Invalid input(s): FREET3 Anemia work up No results for input(s): VITAMINB12, FOLATE, FERRITIN, TIBC, IRON, RETICCTPCT in the last 72 hours. Urinalysis    Component Value Date/Time   COLORURINE STRAW (A) 08/25/2019 0459   APPEARANCEUR CLEAR 08/25/2019 0459   LABSPEC 1.036 (H) 08/25/2019 0459   PHURINE 5.0 08/25/2019 0459   GLUCOSEU >=500 (A) 08/25/2019 0459   HGBUR NEGATIVE 08/25/2019 0459   BILIRUBINUR NEGATIVE 08/25/2019 0459   KETONESUR 80 (A) 08/25/2019 0459    PROTEINUR 30 (A) 08/25/2019 0459   NITRITE NEGATIVE 08/25/2019 0459   LEUKOCYTESUR NEGATIVE 08/25/2019 0459   Sepsis Labs Invalid input(s): PROCALCITONIN,  WBC,  LACTICIDVEN Microbiology Recent Results (from the past 240 hour(s))  SARS Coronavirus 2 by RT PCR (hospital order, performed in [facility] hospital lab) Nasopharyngeal Nasopharyngeal Swab     Status: None   Collection Time: 08/25/19  5:44 AM   Specimen: Nasopharyngeal Swab  Result Value Ref Range Status   SARS Coronavirus 2 NEGATIVE NEGATIVE Final    Comment: (NOTE) SARS-CoV-2 target nucleic acids are NOT DETECTED.  The SARS-CoV-2 RNA is generally detectable in upper and lower respiratory specimens during the acute phase of infection. The lowest concentration of SARS-CoV-2 viral copies this assay can detect is 250 copies / mL. A negative result does not preclude SARS-CoV-2 infection and should not be used as the sole basis for treatment or other patient management decisions.  A negative result may occur with improper specimen collection / handling, submission of specimen other than nasopharyngeal swab, presence of viral mutation(s) within the areas targeted by this assay, and inadequate number of viral copies (<250 copies / mL). A negative result must be combined with clinical observations, patient history, and epidemiological information.  Fact  Sheet for Patients:   StrictlyIdeas.no  Fact Sheet for Healthcare Providers: BankingDealers.co.za  This test is not yet approved or  cleared by the [place] FDA and has been authorized for detection and/or diagnosis of SARS-CoV-2 by FDA under an Emergency Use Authorization (EUA).  This EUA will remain in effect (meaning this test can be used) for the duration of the COVID-19 declaration under Section 564(b)(1) of the Act, 21 U.S.C. section 360bbb-3(b)(1), unless the authorization is terminated or revoked sooner.  Performed  at [facility], [place] [address], [place], [place] [address]   MRSA PCR Screening     Status: Abnormal   Collection Time: 08/25/19  7:31 PM   Specimen: Nasopharyngeal  Result Value Ref Range Status   MRSA by PCR POSITIVE (A) NEGATIVE Final    Comment:        The GeneXpert MRSA Assay (FDA approved for NASAL specimens only), is one component of a comprehensive MRSA colonization surveillance program. It is not intended to diagnose MRSA infection nor to guide or monitor treatment for MRSA infections. RESULT CALLED TO, READ BACK BY AND VERIFIED WITH: CHIP, EMILY @ 0567 08/26/2019 Rikki Spearing. Performed at [facility], [place] [address], [place], [place] [address]      Time coordinating discharge: Over 30 minutes  SIGNED:   Little Ishikawa, DO [facility] 09/01/2019, 12:40 PM Pager   If 7PM-7AM, please contact night-coverage [email or web address]

## 2019-09-01 NOTE — Progress Notes (Signed)
Spoke with patient and spouse about his diabetes. States that he was diagnosed in 2019. States that his father has Type 1 diabetes and on insulin. Patient and spouse had good questions. Reviewed his A1C of 15.5%, suggested that he wait to see an eye doctor until his blood sugars have readjusted. ( wait about 6 weeks) Will need to follow up with PCP as soon as possible.   Reviewed the insulin pen for demonstration. Did very well with the procedure. Spouse was able to follow instructions, with feedback,as well. Will have staff RN have patient give own injection at noon. Ordered insulin pen starter kit and Living Well with Diabetes booklet.   Will have case manager run a benefit check on his insurance for covering the Lantus Solostar insulin pen.   Reviewed signs and symptoms of hypoglycemia and how to treat. Will add Exit Notes on diabetes for reference at discharge.   Harvel Ricks RN BSN CDE Diabetes Coordinator Pager: [phone]  8am-5pm

## 2019-09-01 NOTE — TOC Benefit Eligibility Note (Signed)
Transition of Care [facility]) Benefit Eligibility Note    Patient Details  Name: Douglas [name]. MRN: [id number] Date of Birth: 06-Jul-1985   Medication/Dose: Casimiro Needle Lantus Insulin pen 10-15 units daily  Covered?: Yes  Tier: Other  Prescription Coverage Preferred Pharmacy: [facility]  Spoke with Person/Company/Phone Number:: [employer]/ [employer]  [facility] [phone]  Co-Pay: $0  Prior Approval: No  Deductible: Met       Kerin Salen Phone Number: 09/01/2019, 12:03 PM

## 2019-09-05 DIAGNOSIS — R9431 Abnormal electrocardiogram [ECG] [EKG]: Secondary | ICD-10-CM | POA: Insufficient documentation

## 2019-09-12 ENCOUNTER — Other Ambulatory Visit: Payer: Self-pay | Admitting: Physician Assistant

## 2019-09-12 DIAGNOSIS — E781 Pure hyperglyceridemia: Secondary | ICD-10-CM

## 2019-09-24 NOTE — Progress Notes (Deleted)
Cardiology Office Note:   Date:  09/24/2019  NAME:  Douglas [name].    MRN: [id number] DOB:  [birth year]   PCP:  Jettie Pagan, NP  Cardiologist:  Reatha Harps, MD  Electrophysiologist:  None   Referring MD: Jettie Pagan, NP   No chief complaint on file. ***  History of Present Illness:   Douglas Aniel Hubble. is a 34 y.o. male with a hx of DM, HTG who presents for follow-up. He was admitted in July of last month for acute pancreatitis and DKA. Had HTG up to 8,000. We started him on vascepa and continued his fenofibrate and statin.   Problem List 1. DM -A1c >15.5 2. Hypertriglyceridemia  3. Paroxysmal Afib -dx in setting of critical illness and spontaneous converted back to NSR -no AC  Past Medical History: Past Medical History:  Diagnosis Date  . Diabetes (HCC)   . Hypercholesteremia   . Hypertension   . Pancreatitis 08/2017    Past Surgical History: Past Surgical History:  Procedure Laterality Date  . TONSILLECTOMY    . WISDOM TOOTH EXTRACTION      Current Medications: No outpatient medications have been marked as taking for the 09/28/19 encounter (Appointment) with O'Neal, Ronnald Ramp, MD.     Allergies:    Patient has no known allergies.   Social History: Social History   Socioeconomic History  . Marital status: Married    Spouse name: Not on file  . Number of children: 5  . Years of education: Not on file  . Highest education level: Not on file  Occupational History  . Occupation: Supply Copy: [employer]  Tobacco Use  . Smoking status: Former Smoker    Types: Cigarettes    Quit date: 02/17/2003    Years since quitting: 16.6  . Smokeless tobacco: Never Used  . Tobacco comment: "teenage years"  Vaping Use  . Vaping Use: Never used  Substance and Sexual Activity  . Alcohol use: Yes    Comment: a drink 2-3 x a year  . Drug use: No  . Sexual activity: Not on file  Other Topics Concern  . Not  on file  Social History Narrative   Lives w/ wife and 5 children.   Social Determinants of Health   Financial Resource Strain:   . Difficulty of Paying Living Expenses:   Food Insecurity:   . Worried About Programme researcher, broadcasting/film/video in the Last Year:   . Barista in the Last Year:   Transportation Needs:   . Freight forwarder (Medical):   Marland Kitchen Lack of Transportation (Non-Medical):   Physical Activity:   . Days of Exercise per Week:   . Minutes of Exercise per Session:   Stress:   . Feeling of Stress :   Social Connections:   . Frequency of Communication with Friends and Family:   . Frequency of Social Gatherings with Friends and Family:   . Attends Religious Services:   . Active Member of Clubs or Organizations:   . Attends Banker Meetings:   Marland Kitchen Marital Status:      Family History: The patient's ***family history includes Diabetes in his father; Hypercholesterolemia in his mother; Hypertension in his mother. There is no history of Colon cancer, Esophageal cancer, Inflammatory bowel disease, Liver disease, Pancreatic cancer, Rectal cancer, or Stomach cancer.  ROS:   All other ROS reviewed and negative. Pertinent positives noted in the HPI.  EKGs/Labs/Other Studies Reviewed:   The following studies were personally reviewed by me today:  EKG:  EKG is *** ordered today.  The ekg ordered today demonstrates ***, and was personally reviewed by me.   TTE 08/26/2019 1. Abnormal septal motion . Left ventricular ejection fraction, by  estimation, is 50 to 55%. The left ventricle has low normal function. The  left ventricle has no regional wall motion abnormalities. There is mild  left ventricular hypertrophy. Left  ventricular diastolic parameters are consistent with Grade I diastolic  dysfunction (impaired relaxation).  2. Right ventricular systolic function is normal. The right ventricular  size is normal.  3. The mitral valve is normal in structure. No  evidence of mitral valve  regurgitation. No evidence of mitral stenosis.  4. The aortic valve is tricuspid. Aortic valve regurgitation is not  visualized. No aortic stenosis is present.  5. The inferior vena cava is normal in size with greater than 50%  respiratory variability, suggesting right atrial pressure of 3 mmHg.   Recent Labs: 08/27/2019: TSH 1.728 08/30/2019: Magnesium 1.9 09/01/2019: ALT 24; BUN 5; Creatinine, Ser 0.78; Hemoglobin 10.8; Platelets 313; Potassium 4.2; Sodium 132   Recent Lipid Panel    Component Value Date/Time   CHOL 368 (H) 09/01/2019 0400   TRIG 498 (H) 09/01/2019 0400   HDL 18 (L) 09/01/2019 0400   CHOLHDL 20.4 09/01/2019 0400   VLDL UNABLE TO CALCULATE IF TRIGLYCERIDE OVER 400 mg/dL 62/13/0865 7846   LDLCALC UNABLE TO CALCULATE IF TRIGLYCERIDE OVER 400 mg/dL 96/29/5284 1324   LDLDIRECT 180.4 (H) 09/01/2019 0400    Physical Exam:   VS:  There were no vitals taken for this visit.   Wt Readings from Last 3 Encounters:  08/25/19 260 lb (117.9 kg)  02/06/19 (!) 313 lb 6.4 oz (142.2 kg)  11/25/17 (!) 322 lb (146.1 kg)    General: Well nourished, well developed, in no acute distress Heart: Atraumatic, normal size  Eyes: PEERLA, EOMI  Neck: Supple, no JVD Endocrine: No thryomegaly Cardiac: Normal S1, S2; RRR; no murmurs, rubs, or gallops Lungs: Clear to auscultation bilaterally, no wheezing, rhonchi or rales  Abd: Soft, nontender, no hepatomegaly  Ext: No edema, pulses 2+ Musculoskeletal: No deformities, BUE and BLE strength normal and equal Skin: Warm and dry, no rashes   Neuro: Alert and oriented to person, place, time, and situation, CNII-XII grossly intact, no focal deficits  Psych: Normal mood and affect   ASSESSMENT:   Douglas [name]. is a 34 y.o. male who presents for the following: No diagnosis found.  PLAN:   There are no diagnoses linked to this encounter.  Disposition: No follow-ups on file.  Medication Adjustments/Labs  and Tests Ordered: Current medicines are reviewed at length with the patient today.  Concerns regarding medicines are outlined above.  No orders of the defined types were placed in this encounter.  No orders of the defined types were placed in this encounter.   There are no Patient Instructions on file for this visit.   Time Spent with Patient: I have spent a total of *** minutes with patient reviewing hospital notes, telemetry, EKGs, labs and examining the patient as well as establishing an assessment and plan that was discussed with the patient.  > 50% of time was spent in direct patient care.  Signed, Lenna Gilford. Flora Lipps, MD [facility]  [address], [address] [place], [place] [address] [phone]  09/24/2019 3:41 PM

## 2019-09-28 ENCOUNTER — Ambulatory Visit: Payer: 59 | Admitting: Cardiovascular Disease

## 2019-11-30 DIAGNOSIS — E7849 Other hyperlipidemia: Secondary | ICD-10-CM | POA: Insufficient documentation

## 2019-12-28 ENCOUNTER — Ambulatory Visit: Payer: 59 | Admitting: Internal Medicine

## 2020-01-17 DIAGNOSIS — D649 Anemia, unspecified: Secondary | ICD-10-CM | POA: Insufficient documentation

## 2020-03-14 ENCOUNTER — Ambulatory Visit: Payer: Self-pay | Admitting: Internal Medicine

## 2020-08-24 ENCOUNTER — Other Ambulatory Visit: Payer: Self-pay

## 2020-08-24 ENCOUNTER — Emergency Department (HOSPITAL_BASED_OUTPATIENT_CLINIC_OR_DEPARTMENT_OTHER)
Admission: EM | Admit: 2020-08-24 | Discharge: 2020-08-24 | Disposition: A | Discharge status: Home / Self Care | Payer: BC Managed Care – PPO | Attending: Emergency Medicine | Admitting: Emergency Medicine

## 2020-08-24 ENCOUNTER — Emergency Department (HOSPITAL_BASED_OUTPATIENT_CLINIC_OR_DEPARTMENT_OTHER): Payer: BC Managed Care – PPO | Admitting: Radiology

## 2020-08-24 ENCOUNTER — Encounter (HOSPITAL_BASED_OUTPATIENT_CLINIC_OR_DEPARTMENT_OTHER): Payer: Self-pay | Admitting: *Deleted

## 2020-08-24 DIAGNOSIS — E119 Type 2 diabetes mellitus without complications: Secondary | ICD-10-CM | POA: Insufficient documentation

## 2020-08-24 DIAGNOSIS — I10 Essential (primary) hypertension: Secondary | ICD-10-CM | POA: Diagnosis not present

## 2020-08-24 DIAGNOSIS — R0789 Other chest pain: Secondary | ICD-10-CM | POA: Insufficient documentation

## 2020-08-24 DIAGNOSIS — Z794 Long term (current) use of insulin: Secondary | ICD-10-CM | POA: Diagnosis not present

## 2020-08-24 DIAGNOSIS — G43809 Other migraine, not intractable, without status migrainosus: Secondary | ICD-10-CM | POA: Insufficient documentation

## 2020-08-24 DIAGNOSIS — Z87891 Personal history of nicotine dependence: Secondary | ICD-10-CM | POA: Insufficient documentation

## 2020-08-24 DIAGNOSIS — Z79899 Other long term (current) drug therapy: Secondary | ICD-10-CM | POA: Insufficient documentation

## 2020-08-24 DIAGNOSIS — Z7984 Long term (current) use of oral hypoglycemic drugs: Secondary | ICD-10-CM | POA: Insufficient documentation

## 2020-08-24 DIAGNOSIS — G43909 Migraine, unspecified, not intractable, without status migrainosus: Secondary | ICD-10-CM | POA: Diagnosis present

## 2020-08-24 LAB — CBC WITH DIFFERENTIAL/PLATELET
Abs Immature Granulocytes: 0.02 10*3/uL (ref 0.00–0.07)
Basophils Absolute: 0.1 10*3/uL (ref 0.0–0.1)
Basophils Relative: 1 %
Eosinophils Absolute: 0.2 10*3/uL (ref 0.0–0.5)
Eosinophils Relative: 3 %
HCT: 44.1 % (ref 39.0–52.0)
Hemoglobin: 14.4 g/dL (ref 13.0–17.0)
Immature Granulocytes: 0 %
Lymphocytes Relative: 37 %
Lymphs Abs: 2.5 10*3/uL (ref 0.7–4.0)
MCH: 28.1 pg (ref 26.0–34.0)
MCHC: 32.7 g/dL (ref 30.0–36.0)
MCV: 86.1 fL (ref 80.0–100.0)
Monocytes Absolute: 0.5 10*3/uL (ref 0.1–1.0)
Monocytes Relative: 7 %
Neutro Abs: 3.6 10*3/uL (ref 1.7–7.7)
Neutrophils Relative %: 52 %
Platelets: 253 10*3/uL (ref 150–400)
RBC: 5.12 MIL/uL (ref 4.22–5.81)
RDW: 13.3 % (ref 11.5–15.5)
WBC: 6.9 10*3/uL (ref 4.0–10.5)
nRBC: 0 % (ref 0.0–0.2)

## 2020-08-24 LAB — COMPREHENSIVE METABOLIC PANEL
ALT: 65 U/L — ABNORMAL HIGH (ref 0–44)
AST: 42 U/L — ABNORMAL HIGH (ref 15–41)
Albumin: 4.4 g/dL (ref 3.5–5.0)
Alkaline Phosphatase: 77 U/L (ref 38–126)
Anion gap: 10 (ref 5–15)
BUN: 12 mg/dL (ref 6–20)
CO2: 23 mmol/L (ref 22–32)
Calcium: 9.2 mg/dL (ref 8.9–10.3)
Chloride: 103 mmol/L (ref 98–111)
Creatinine, Ser: 0.96 mg/dL (ref 0.61–1.24)
GFR, Estimated: >60 mL/min (ref >60)
Glucose, Bld: 85 mg/dL (ref 70–99)
Potassium: 3.5 mmol/L (ref 3.5–5.1)
Sodium: 136 mmol/L (ref 135–145)
Total Bilirubin: 0.4 mg/dL (ref 0.3–1.2)
Total Protein: 7.9 g/dL (ref 6.5–8.1)

## 2020-08-24 LAB — TROPONIN I (HIGH SENSITIVITY)
Troponin I (High Sensitivity): 9 ng/L (ref <18)
Troponin I (High Sensitivity): 9 ng/L (ref <18)

## 2020-08-24 LAB — LIPASE, BLOOD: Lipase: 30 U/L (ref 11–51)

## 2020-08-24 MED ORDER — PROCHLORPERAZINE EDISYLATE 10 MG/2ML IJ SOLN
10.0000 mg | Freq: Once | INTRAMUSCULAR | Status: AC
  Administered 2020-08-24: 10 mg via INTRAVENOUS
  Filled 2020-08-24: qty 2

## 2020-08-24 MED ORDER — DEXAMETHASONE SODIUM PHOSPHATE 10 MG/ML IJ SOLN
10.0000 mg | Freq: Once | INTRAMUSCULAR | Status: AC
  Administered 2020-08-24: 10 mg via INTRAVENOUS
  Filled 2020-08-24: qty 1

## 2020-08-24 MED ORDER — DIPHENHYDRAMINE HCL 50 MG/ML IJ SOLN
25.0000 mg | Freq: Once | INTRAMUSCULAR | Status: AC
  Administered 2020-08-24: 25 mg via INTRAVENOUS
  Filled 2020-08-24: qty 1

## 2020-08-24 NOTE — ED Triage Notes (Signed)
Migraine x 3 days with no relief from home meds. Chest pain started today.

## 2020-08-24 NOTE — ED Provider Notes (Signed)
[facility] EMERGENCY DEPT Provider Note   CSN: [id number] Arrival date & time: 08/24/20  1658     History Chief Complaint  Patient presents with   Migraine    Douglas [name]. is a 35 y.o. male.  The history is provided by the patient.  Migraine This is a chronic problem. The current episode started more than 2 days ago. The problem occurs daily. Progression since onset: waxing and waning. Associated symptoms include chest pain and headaches. Pertinent negatives include no abdominal pain and no shortness of breath. Nothing aggravates the symptoms. Nothing relieves the symptoms. Treatments tried: motrin. The treatment provided mild relief.      Past Medical History:  Diagnosis Date   Diabetes ([place])    Hypercholesteremia    Hypertension    Pancreatitis 08/2017    Patient Active Problem List   Diagnosis Date Noted   Atrial fibrillation with RVR ([place]) 08/26/2019   Type 2 diabetes mellitus without complication ([place]) 74/01/8785   Hyperlipidemia 08/25/2019   History of pancreatitis 11/28/2017   Hypertriglyceridemia 11/28/2017   Peripancreatic fluid collection 11/28/2017   Rectal bleeding 11/28/2017   Acute pancreatitis 09/16/2017   Abdominal pain 09/16/2017   Leukocytosis 09/16/2017   Hyponatremia 09/16/2017   Abnormal LFTs 09/16/2017   Obesity, Class III, BMI 40-49.9 (morbid obesity) ([place]) 09/16/2017   Hypertension 09/16/2017   Pancreatitis 09/16/2017   Acute pancreatitis without infection or necrosis 09/16/2017    Past Surgical History:  Procedure Laterality Date   TONSILLECTOMY     WISDOM TOOTH EXTRACTION         Family History  Problem Relation Age of Onset   Hypercholesterolemia Mother    Hypertension Mother    Diabetes Father    Colon cancer Neg Hx    Esophageal cancer Neg Hx    Inflammatory bowel disease Neg Hx    Liver disease Neg Hx    Pancreatic cancer Neg Hx    Rectal cancer Neg Hx    Stomach cancer Neg Hx     Social  History   Tobacco Use   Smoking status: Former    Pack years: 0.00    Types: Cigarettes    Quit date: 02/17/2003    Years since quitting: 17.5   Smokeless tobacco: Never   Tobacco comments:    "teenage years"  Vaping Use   Vaping Use: Never used  Substance Use Topics   Alcohol use: Yes    Comment: a drink 2-3 x a year   Drug use: No    Home Medications Prior to Admission medications   Medication Sig Start Date End Date Taking? Authorizing Provider  atorvastatin (LIPITOR) 80 MG tablet Take 1 tablet (80 mg total) by mouth daily at 6 PM. 09/02/19  Yes Little Ishikawa, MD  blood glucose meter kit and supplies Dispense based on patient and insurance preference. Use up to four times daily as directed. (FOR ICD-10 E10.9, E11.9). 09/22/17  Yes Mikhail, Maryann, DO  fenofibrate 160 MG tablet Take 160 mg by mouth daily. 10/30/16  Yes [provider]  ibuprofen (ADVIL,MOTRIN) 800 MG tablet Take 800 mg by mouth daily as needed for pain. 09/24/16  Yes [provider]  metFORMIN (GLUCOPHAGE) 500 MG tablet Take 2 tablets in the AM and 1 tablet in the PM   Yes [provider]  metoprolol succinate (TOPROL-XL) 50 MG 24 hr tablet Take 1 tablet (50 mg total) by mouth daily. 09/02/19  Yes Little Ishikawa, MD  pantoprazole (Coal)  40 MG tablet Take 40 mg by mouth daily. 06/26/19  Yes [provider]  blood glucose meter kit and supplies KIT Dispense based on patient and insurance preference. Use up to four times daily as directed. (FOR ICD-9 250.00, 250.01). 09/01/19   Little Ishikawa, MD  insulin glargine (LANTUS) 100 UNIT/ML Solostar Pen Inject 10 Units into the skin daily. 09/01/19   Little Ishikawa, MD    Allergies    Patient has no known allergies.  Review of Systems   Review of Systems  Constitutional:  Negative for chills and fever.  HENT:  Negative for ear pain and sore throat.   Eyes:  Negative for pain and visual disturbance.  Respiratory:   Negative for cough and shortness of breath.   Cardiovascular:  Positive for chest pain. Negative for palpitations.  Gastrointestinal:  Negative for abdominal pain and vomiting.  Genitourinary:  Negative for dysuria and hematuria.  Musculoskeletal:  Negative for arthralgias and back pain.  Skin:  Negative for color change and rash.  Neurological:  Positive for headaches. Negative for dizziness, tremors, seizures, syncope, facial asymmetry, speech difficulty, weakness, light-headedness and numbness.  All other systems reviewed and are negative.  Physical Exam Updated Vital Signs BP (!) 135/98 (BP Location: Left Arm)   Pulse 93   Temp 98.7 F (37.1 C)   Resp 20   Ht 6' (1.829 m)   Wt (!) 147.4 kg   SpO2 100%   BMI 44.08 kg/m   Physical Exam Vitals and nursing note reviewed.  Constitutional:      Appearance: He is well-developed.  HENT:     Head: Normocephalic and atraumatic.     Nose: Nose normal.     Mouth/Throat:     Mouth: Mucous membranes are moist.  Eyes:     Extraocular Movements: Extraocular movements intact.     Conjunctiva/sclera: Conjunctivae normal.     Pupils: Pupils are equal, round, and reactive to light.  Cardiovascular:     Rate and Rhythm: Normal rate and regular rhythm.     Pulses: Normal pulses.     Heart sounds: Normal heart sounds. No murmur heard. Pulmonary:     Effort: Pulmonary effort is normal. No respiratory distress.     Breath sounds: Normal breath sounds.  Abdominal:     Palpations: Abdomen is soft.     Tenderness: There is no abdominal tenderness.  Musculoskeletal:     Cervical back: Neck supple.  Skin:    General: Skin is warm and dry.  Neurological:     General: No focal deficit present.     Mental Status: He is alert and oriented to person, place, and time.     Cranial Nerves: No cranial nerve deficit.     Sensory: No sensory deficit.     Motor: No weakness.     Coordination: Coordination normal.     Comments: 5+ out of 5 strength  throughout, normal sensation, no drift, normal finger-to-nose finger, normal speech    ED Results / Procedures / Treatments   Labs (all labs ordered are listed, but only abnormal results are displayed) Labs Reviewed  COMPREHENSIVE METABOLIC PANEL - Abnormal; Notable for the following components:      Result Value   AST 42 (*)    ALT 65 (*)    All other components within normal limits  CBC WITH DIFFERENTIAL/PLATELET  LIPASE, BLOOD  TROPONIN I (HIGH SENSITIVITY)  TROPONIN I (HIGH SENSITIVITY)    EKG EKG Interpretation  Date/Time:  Saturday August 24 2020 17:52:25 EDT Ventricular Rate:  82 PR Interval:  164 QRS Duration: 106 QT Interval:  366 QTC Calculation: 427 R Axis:   79 Text Interpretation: Normal sinus rhythm no significant change since last tracing except no flutter Confirmed by Lennice Sites ([id number]) on 08/24/2020 6:30:28 PM  Radiology DG Chest 2 View  Result Date: 08/24/2020 CLINICAL DATA:  Chest pain EXAM: CHEST - 2 VIEW COMPARISON:  08/25/2019 FINDINGS: The heart size and mediastinal contours are within normal limits. Both lungs are clear. The visualized skeletal structures are unremarkable. IMPRESSION: No acute abnormality of the lungs. Electronically Signed   By: Eddie Candle M.D.   On: 08/24/2020 19:42    Procedures Procedures   Medications Ordered in ED Medications  dexamethasone (DECADRON) injection 10 mg (10 mg Intravenous Given 08/24/20 1935)  prochlorperazine (COMPAZINE) injection 10 mg (10 mg Intravenous Given 08/24/20 1935)  diphenhydrAMINE (BENADRYL) injection 25 mg (25 mg Intravenous Given 08/24/20 1918)    ED Course  I have reviewed the triage vital signs and the nursing notes.  Pertinent labs & imaging results that were available during my care of the patient were reviewed by me and considered in my medical decision making (see chart for details).    MDM Rules/Calculators/A&P                          Keigan Ilda Basset. is here with typical migraine  headache but slightly worse than normal.  Having some atypical chest pain as well.  EKG shows sinus rhythm.  No ischemic changes.  Unchanged from prior.  Troponin negative x2.  No significant leukocytosis, anemia, electrolyte issues, kidney injury.  Chest x-ray negative for pneumonia, no pneumothorax.  Neurological exam is unremarkable.  Seems to be classic migraine.  Got much better after headache cocktail.  No evidence of gallbladder or liver problem.  No pancreatitis which she has a history of.  Overall given reassurance and discharged in ED in good condition.  Concern for ACS, PE, dissection.  No concern for meningitis.  This chart was dictated using voice recognition software.  Despite best efforts to proofread,  errors can occur which can change the documentation meaning.   Final Clinical Impression(s) / ED Diagnoses Final diagnoses:  Other migraine without status migrainosus, not intractable  Atypical chest pain    Rx / DC Orders ED Discharge Orders     None        Lennice Sites, DO 08/24/20 2113

## 2021-02-17 ENCOUNTER — Ambulatory Visit (HOSPITAL_COMMUNITY)
Admission: AD | Admit: 2021-02-17 | Discharge: 2021-02-17 | Disposition: A | Discharge status: Home / Self Care | Payer: BC Managed Care – PPO | Attending: Psychiatry | Admitting: Psychiatry

## 2021-02-17 DIAGNOSIS — F4321 Adjustment disorder with depressed mood: Secondary | ICD-10-CM | POA: Insufficient documentation
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
## 2021-06-29 ENCOUNTER — Ambulatory Visit (HOSPITAL_COMMUNITY)
Admission: AD | Admit: 2021-06-29 | Discharge: 2021-06-29 | Disposition: A | Discharge status: Home / Self Care | Payer: BC Managed Care – PPO | Attending: Psychiatry | Admitting: Psychiatry

## 2021-06-29 NOTE — Progress Notes (Signed)
Vital signs completed by Lamount Cranker, RN. Patient reports to Lamount Cranker he takes medication for his blood pressure but he did not take it this morning. Reported BP and information to nurse practitioner, Leslie Dales. ?
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
## 2021-07-31 DIAGNOSIS — M7541 Impingement syndrome of right shoulder: Secondary | ICD-10-CM | POA: Insufficient documentation

## 2021-07-31 DIAGNOSIS — M7521 Bicipital tendinitis, right shoulder: Secondary | ICD-10-CM | POA: Insufficient documentation

## 2021-08-14 DIAGNOSIS — Z9889 Other specified postprocedural states: Secondary | ICD-10-CM | POA: Insufficient documentation

## 2022-05-06 ENCOUNTER — Inpatient Hospital Stay (HOSPITAL_BASED_OUTPATIENT_CLINIC_OR_DEPARTMENT_OTHER)
Admission: EM | Admit: 2022-05-06 | Discharge: 2022-05-13 | DRG: 439 | Disposition: A | Discharge status: Home / Self Care | Payer: Medicaid Other | Attending: Internal Medicine | Admitting: Internal Medicine

## 2022-05-06 ENCOUNTER — Other Ambulatory Visit: Payer: Self-pay

## 2022-05-06 ENCOUNTER — Encounter (HOSPITAL_BASED_OUTPATIENT_CLINIC_OR_DEPARTMENT_OTHER): Payer: Self-pay

## 2022-05-06 DIAGNOSIS — Z87891 Personal history of nicotine dependence: Secondary | ICD-10-CM

## 2022-05-06 DIAGNOSIS — E872 Acidosis, unspecified: Secondary | ICD-10-CM | POA: Diagnosis present

## 2022-05-06 DIAGNOSIS — Z6839 Body mass index (BMI) 39.0-39.9, adult: Secondary | ICD-10-CM

## 2022-05-06 DIAGNOSIS — I10 Essential (primary) hypertension: Secondary | ICD-10-CM | POA: Diagnosis present

## 2022-05-06 DIAGNOSIS — E876 Hypokalemia: Secondary | ICD-10-CM | POA: Diagnosis not present

## 2022-05-06 DIAGNOSIS — K859 Acute pancreatitis without necrosis or infection, unspecified: Secondary | ICD-10-CM | POA: Diagnosis present

## 2022-05-06 DIAGNOSIS — E11649 Type 2 diabetes mellitus with hypoglycemia without coma: Secondary | ICD-10-CM | POA: Diagnosis not present

## 2022-05-06 DIAGNOSIS — K760 Fatty (change of) liver, not elsewhere classified: Secondary | ICD-10-CM | POA: Diagnosis present

## 2022-05-06 DIAGNOSIS — E1165 Type 2 diabetes mellitus with hyperglycemia: Secondary | ICD-10-CM | POA: Diagnosis present

## 2022-05-06 DIAGNOSIS — Z794 Long term (current) use of insulin: Secondary | ICD-10-CM

## 2022-05-06 DIAGNOSIS — Z8249 Family history of ischemic heart disease and other diseases of the circulatory system: Secondary | ICD-10-CM

## 2022-05-06 DIAGNOSIS — Z833 Family history of diabetes mellitus: Secondary | ICD-10-CM

## 2022-05-06 DIAGNOSIS — E669 Obesity, unspecified: Secondary | ICD-10-CM | POA: Diagnosis present

## 2022-05-06 DIAGNOSIS — Z7984 Long term (current) use of oral hypoglycemic drugs: Secondary | ICD-10-CM

## 2022-05-06 DIAGNOSIS — K8581 Other acute pancreatitis with uninfected necrosis: Principal | ICD-10-CM | POA: Diagnosis present

## 2022-05-06 DIAGNOSIS — E871 Hypo-osmolality and hyponatremia: Secondary | ICD-10-CM | POA: Diagnosis present

## 2022-05-06 DIAGNOSIS — E119 Type 2 diabetes mellitus without complications: Secondary | ICD-10-CM

## 2022-05-06 DIAGNOSIS — N179 Acute kidney failure, unspecified: Secondary | ICD-10-CM | POA: Diagnosis present

## 2022-05-06 DIAGNOSIS — E781 Pure hyperglyceridemia: Secondary | ICD-10-CM | POA: Diagnosis present

## 2022-05-06 DIAGNOSIS — Z79899 Other long term (current) drug therapy: Secondary | ICD-10-CM

## 2022-05-06 DIAGNOSIS — E66812 Obesity, class 2: Secondary | ICD-10-CM | POA: Diagnosis present

## 2022-05-06 DIAGNOSIS — E7800 Pure hypercholesterolemia, unspecified: Secondary | ICD-10-CM | POA: Diagnosis present

## 2022-05-06 DIAGNOSIS — Z83438 Family history of other disorder of lipoprotein metabolism and other lipidemia: Secondary | ICD-10-CM

## 2022-05-06 LAB — CBC
HCT: 47.7 % (ref 39.0–52.0)
Hemoglobin: 16.9 g/dL (ref 13.0–17.0)
MCH: 29.5 pg (ref 26.0–34.0)
MCHC: 35.4 g/dL (ref 30.0–36.0)
MCV: 83.2 fL (ref 80.0–100.0)
Platelets: 246 10*3/uL (ref 150–400)
RBC: 5.73 MIL/uL (ref 4.22–5.81)
RDW: 13.1 % (ref 11.5–15.5)
WBC: 12 10*3/uL — ABNORMAL HIGH (ref 4.0–10.5)
nRBC: 0 % (ref 0.0–0.2)

## 2022-05-06 LAB — URINALYSIS, ROUTINE W REFLEX MICROSCOPIC
Bacteria, UA: NONE SEEN
Bilirubin Urine: NEGATIVE
Glucose, UA: >1000 mg/dL — AB
Ketones, ur: 15 mg/dL — AB
Leukocytes,Ua: NEGATIVE
Nitrite: NEGATIVE
Protein, ur: 30 mg/dL — AB
Specific Gravity, Urine: 1.038 — ABNORMAL HIGH (ref 1.005–1.030)
pH: 5.5 (ref 5.0–8.0)

## 2022-05-06 LAB — CBG MONITORING, ED: Glucose-Capillary: 337 mg/dL — ABNORMAL HIGH (ref 70–99)

## 2022-05-06 MED ORDER — HYDROMORPHONE HCL 1 MG/ML IJ SOLN
1.0000 mg | Freq: Once | INTRAMUSCULAR | Status: AC
  Administered 2022-05-06: 1 mg via INTRAVENOUS
  Filled 2022-05-06: qty 1

## 2022-05-06 MED ORDER — FENTANYL CITRATE PF 50 MCG/ML IJ SOSY: 50.0000 ug | PREFILLED_SYRINGE | Freq: Once | INTRAMUSCULAR | Status: DC

## 2022-05-06 MED ORDER — SODIUM CHLORIDE 0.9 % IV BOLUS
1000.0000 mL | Freq: Once | INTRAVENOUS | Status: AC
  Administered 2022-05-06: 1000 mL via INTRAVENOUS

## 2022-05-06 MED ORDER — ONDANSETRON HCL 4 MG/2ML IJ SOLN
4.0000 mg | Freq: Once | INTRAMUSCULAR | Status: AC
  Administered 2022-05-06: 4 mg via INTRAVENOUS
  Filled 2022-05-06: qty 2

## 2022-05-06 NOTE — ED Provider Notes (Signed)
[facility]  Provider Note  CSN: [id number] Arrival date & time: 05/06/22 2111  History Chief Complaint  Patient presents with   Abdominal Pain    Timofey Moshe Turchetta. is a 37 y.o. male with history of hypertriglyceridemia, HTN, DM and pancreatitis reports he was off of his medications for several months until he saw his PCP in Feb, Triglycerides then were >2000 and he was started back on his meds. He has had 2 days of severe aching LLQ abdominal pain, no vomiting. Felt warm but no fever. No diarrhea. He has been urinating more frequently.    Home Medications Prior to Admission medications   Medication Sig Start Date End Date Taking? Authorizing Provider  atorvastatin (LIPITOR) 80 MG tablet Take 1 tablet (80 mg total) by mouth daily at 6 PM. 09/02/19   Little Ishikawa, MD  blood glucose meter kit and supplies KIT Dispense based on patient and insurance preference. Use up to four times daily as directed. (FOR ICD-9 250.00, 250.01). 09/01/19   Little Ishikawa, MD  blood glucose meter kit and supplies Dispense based on patient and insurance preference. Use up to four times daily as directed. (FOR ICD-10 E10.9, E11.9). 09/22/17   Mikhail, Velta Addison, DO  fenofibrate 160 MG tablet Take 160 mg by mouth daily. 10/30/16   [provider]  ibuprofen (ADVIL,MOTRIN) 800 MG tablet Take 800 mg by mouth daily as needed for pain. 09/24/16   [provider]  insulin glargine (LANTUS) 100 UNIT/ML Solostar Pen Inject 10 Units into the skin daily. 09/01/19   Little Ishikawa, MD  metFORMIN (GLUCOPHAGE) 500 MG tablet Take 2 tablets in the AM and 1 tablet in the PM    [provider]  metoprolol succinate (TOPROL-XL) 50 MG 24 hr tablet Take 1 tablet (50 mg total) by mouth daily. 09/02/19   Little Ishikawa, MD  pantoprazole (PROTONIX) 40 MG tablet Take 40 mg by mouth daily. 06/26/19   [provider]     Allergies     Patient has no known allergies.   Review of Systems   Review of Systems Please see HPI for pertinent positives and negatives  Physical Exam BP 132/87   Pulse 97   Temp 98.3 F (36.8 C)   Resp 18   SpO2 98%   Physical Exam Vitals and nursing note reviewed.  Constitutional:      Appearance: Normal appearance.  HENT:     Head: Normocephalic and atraumatic.     Nose: Nose normal.     Mouth/Throat:     Mouth: Mucous membranes are moist.  Eyes:     Extraocular Movements: Extraocular movements intact.     Conjunctiva/sclera: Conjunctivae normal.  Cardiovascular:     Rate and Rhythm: Normal rate.  Pulmonary:     Effort: Pulmonary effort is normal.     Breath sounds: Normal breath sounds.  Abdominal:     General: Abdomen is flat.     Palpations: Abdomen is soft.     Tenderness: There is abdominal tenderness in the left lower quadrant. There is guarding. Negative signs include Murphy's sign and McBurney's sign.  Musculoskeletal:        General: No swelling. Normal range of motion.     Cervical back: Neck supple.  Skin:    General: Skin is warm and dry.  Neurological:     General: No focal deficit present.     Mental Status: He is alert.  Psychiatric:        Mood and Affect: Mood normal.     ED Results / Procedures / Treatments   EKG None  Procedures Procedures  Medications Ordered in the ED Medications  sodium chloride 0.9 % bolus 1,000 mL (has no administration in time range)  ondansetron (ZOFRAN) injection 4 mg (has no administration in time range)  HYDROmorphone (DILAUDID) injection 1 mg (has no administration in time range)    Initial Impression and Plan  Patient with known pancreatitis due to high triglycerides here for LLQ pain which is not the usual location for his pain (has had two prior episodes of pancreatitis). Labs done in triage show CBC with mild leukocytosis. UA with glucose, no signs of infection. CMP and Lipase had to be sent to [facility] due to  lipemia. Will give pain and nausea meds for comfort. Will likely need CT to eval other causes of abdominal pain such as diverticulitis after chemistry is resulted.   ED Course       MDM Rules/Calculators/A&P Medical Decision Making Amount and/or Complexity of Data Reviewed Labs: ordered.  Risk Prescription drug management.     Final Clinical Impression(s) / ED Diagnoses Final diagnoses:  None    Rx / DC Orders ED Discharge Orders     None

## 2022-05-06 NOTE — ED Triage Notes (Signed)
Pt c/o sharp LLQ abd pain, hx pancreatitis & states that pain feels similar to previous pancreatic episodes. Saw PCP 2wks ago, states he tried to get more consistent w medication but feels he's unable to get ahead of symptoms

## 2022-05-07 ENCOUNTER — Emergency Department (HOSPITAL_BASED_OUTPATIENT_CLINIC_OR_DEPARTMENT_OTHER): Payer: Medicaid Other

## 2022-05-07 ENCOUNTER — Encounter (HOSPITAL_COMMUNITY): Payer: Self-pay | Admitting: Internal Medicine

## 2022-05-07 DIAGNOSIS — E669 Obesity, unspecified: Secondary | ICD-10-CM | POA: Diagnosis present

## 2022-05-07 DIAGNOSIS — K859 Acute pancreatitis without necrosis or infection, unspecified: Secondary | ICD-10-CM | POA: Diagnosis present

## 2022-05-07 DIAGNOSIS — E7800 Pure hypercholesterolemia, unspecified: Secondary | ICD-10-CM | POA: Diagnosis present

## 2022-05-07 DIAGNOSIS — E1165 Type 2 diabetes mellitus with hyperglycemia: Secondary | ICD-10-CM | POA: Diagnosis present

## 2022-05-07 DIAGNOSIS — Z7984 Long term (current) use of oral hypoglycemic drugs: Secondary | ICD-10-CM | POA: Diagnosis not present

## 2022-05-07 DIAGNOSIS — N179 Acute kidney failure, unspecified: Secondary | ICD-10-CM | POA: Diagnosis present

## 2022-05-07 DIAGNOSIS — Z6839 Body mass index (BMI) 39.0-39.9, adult: Secondary | ICD-10-CM | POA: Diagnosis not present

## 2022-05-07 DIAGNOSIS — E872 Acidosis, unspecified: Secondary | ICD-10-CM | POA: Diagnosis present

## 2022-05-07 DIAGNOSIS — E876 Hypokalemia: Secondary | ICD-10-CM | POA: Diagnosis not present

## 2022-05-07 DIAGNOSIS — Z833 Family history of diabetes mellitus: Secondary | ICD-10-CM | POA: Diagnosis not present

## 2022-05-07 DIAGNOSIS — E119 Type 2 diabetes mellitus without complications: Secondary | ICD-10-CM | POA: Diagnosis not present

## 2022-05-07 DIAGNOSIS — Z79899 Other long term (current) drug therapy: Secondary | ICD-10-CM | POA: Diagnosis not present

## 2022-05-07 DIAGNOSIS — Z87891 Personal history of nicotine dependence: Secondary | ICD-10-CM | POA: Diagnosis not present

## 2022-05-07 DIAGNOSIS — Z794 Long term (current) use of insulin: Secondary | ICD-10-CM | POA: Diagnosis not present

## 2022-05-07 DIAGNOSIS — I10 Essential (primary) hypertension: Secondary | ICD-10-CM | POA: Diagnosis present

## 2022-05-07 DIAGNOSIS — K760 Fatty (change of) liver, not elsewhere classified: Secondary | ICD-10-CM | POA: Diagnosis present

## 2022-05-07 DIAGNOSIS — Z8249 Family history of ischemic heart disease and other diseases of the circulatory system: Secondary | ICD-10-CM | POA: Diagnosis not present

## 2022-05-07 DIAGNOSIS — K8581 Other acute pancreatitis with uninfected necrosis: Secondary | ICD-10-CM | POA: Diagnosis present

## 2022-05-07 DIAGNOSIS — E11649 Type 2 diabetes mellitus with hypoglycemia without coma: Secondary | ICD-10-CM | POA: Diagnosis not present

## 2022-05-07 DIAGNOSIS — E781 Pure hyperglyceridemia: Secondary | ICD-10-CM | POA: Diagnosis present

## 2022-05-07 DIAGNOSIS — Z83438 Family history of other disorder of lipoprotein metabolism and other lipidemia: Secondary | ICD-10-CM | POA: Diagnosis not present

## 2022-05-07 DIAGNOSIS — E871 Hypo-osmolality and hyponatremia: Secondary | ICD-10-CM | POA: Diagnosis present

## 2022-05-07 LAB — GLUCOSE, CAPILLARY
Glucose-Capillary: 287 mg/dL — ABNORMAL HIGH (ref 70–99)
Glucose-Capillary: 213 mg/dL — ABNORMAL HIGH (ref 70–99)
Glucose-Capillary: 158 mg/dL — ABNORMAL HIGH (ref 70–99)
Glucose-Capillary: 112 mg/dL — ABNORMAL HIGH (ref 70–99)
Glucose-Capillary: 106 mg/dL — ABNORMAL HIGH (ref 70–99)
Glucose-Capillary: 148 mg/dL — ABNORMAL HIGH (ref 70–99)
Glucose-Capillary: 197 mg/dL — ABNORMAL HIGH (ref 70–99)
Glucose-Capillary: 208 mg/dL — ABNORMAL HIGH (ref 70–99)
Glucose-Capillary: 203 mg/dL — ABNORMAL HIGH (ref 70–99)
Glucose-Capillary: 162 mg/dL — ABNORMAL HIGH (ref 70–99)
Glucose-Capillary: 172 mg/dL — ABNORMAL HIGH (ref 70–99)
Glucose-Capillary: 169 mg/dL — ABNORMAL HIGH (ref 70–99)
Glucose-Capillary: 167 mg/dL — ABNORMAL HIGH (ref 70–99)
Glucose-Capillary: 172 mg/dL — ABNORMAL HIGH (ref 70–99)
Glucose-Capillary: 164 mg/dL — ABNORMAL HIGH (ref 70–99)
Glucose-Capillary: 130 mg/dL — ABNORMAL HIGH (ref 70–99)
Glucose-Capillary: 119 mg/dL — ABNORMAL HIGH (ref 70–99)

## 2022-05-07 LAB — LIPID PANEL
Cholesterol: >1000 mg/dL — ABNORMAL HIGH (ref 0–200)
LDL Cholesterol: UNDETERMINED mg/dL (ref 0–99)
Triglycerides: >5000 mg/dL — ABNORMAL HIGH (ref <150)
VLDL: UNDETERMINED mg/dL (ref 0–40)

## 2022-05-07 LAB — CBC
HCT: 46.1 % (ref 39.0–52.0)
Hemoglobin: 16.4 g/dL (ref 13.0–17.0)
MCH: 30.5 pg (ref 26.0–34.0)
MCHC: 35.6 g/dL (ref 30.0–36.0)
MCV: 85.7 fL (ref 80.0–100.0)
Platelets: 215 10*3/uL (ref 150–400)
RBC: 5.38 MIL/uL (ref 4.22–5.81)
RDW: 13.5 % (ref 11.5–15.5)
WBC: 12 10*3/uL — ABNORMAL HIGH (ref 4.0–10.5)
nRBC: 0.1 % (ref 0.0–0.2)

## 2022-05-07 LAB — COMPREHENSIVE METABOLIC PANEL
ALT: 41 U/L (ref 0–44)
ALT: 34 U/L (ref 0–44)
AST: 42 U/L — ABNORMAL HIGH (ref 15–41)
AST: 46 U/L — ABNORMAL HIGH (ref 15–41)
Albumin: 4.8 g/dL (ref 3.5–5.0)
Albumin: 3.9 g/dL (ref 3.5–5.0)
Alkaline Phosphatase: 182 U/L — ABNORMAL HIGH (ref 38–126)
Alkaline Phosphatase: 96 U/L (ref 38–126)
Anion gap: 15 (ref 5–15)
Anion gap: 12 (ref 5–15)
BUN: 12 mg/dL (ref 6–20)
BUN: 12 mg/dL (ref 6–20)
CO2: 15 mmol/L — ABNORMAL LOW (ref 22–32)
CO2: 16 mmol/L — ABNORMAL LOW (ref 22–32)
Calcium: 9.5 mg/dL (ref 8.9–10.3)
Calcium: 8.6 mg/dL — ABNORMAL LOW (ref 8.9–10.3)
Chloride: 97 mmol/L — ABNORMAL LOW (ref 98–111)
Chloride: 105 mmol/L (ref 98–111)
Creatinine, Ser: 1.08 mg/dL (ref 0.61–1.24)
Creatinine, Ser: 1.92 mg/dL — ABNORMAL HIGH (ref 0.61–1.24)
GFR, Estimated: >60 mL/min (ref >60)
GFR, Estimated: 46 mL/min — ABNORMAL LOW (ref >60)
Glucose, Bld: 460 mg/dL — ABNORMAL HIGH (ref 70–99)
Glucose, Bld: 212 mg/dL — ABNORMAL HIGH (ref 70–99)
Potassium: 3.5 mmol/L (ref 3.5–5.1)
Potassium: 3.5 mmol/L (ref 3.5–5.1)
Sodium: 127 mmol/L — ABNORMAL LOW (ref 135–145)
Sodium: 133 mmol/L — ABNORMAL LOW (ref 135–145)
Total Bilirubin: 0.7 mg/dL (ref 0.3–1.2)
Total Bilirubin: 0.7 mg/dL (ref 0.3–1.2)
Total Protein: 8.5 g/dL — ABNORMAL HIGH (ref 6.5–8.1)
Total Protein: 7.1 g/dL (ref 6.5–8.1)

## 2022-05-07 LAB — LIPASE, BLOOD
Lipase: 580 U/L — ABNORMAL HIGH (ref 11–51)
Lipase: 385 U/L — ABNORMAL HIGH (ref 11–51)

## 2022-05-07 LAB — TRIGLYCERIDES
Triglycerides: >5000 mg/dL — ABNORMAL HIGH (ref <150)
Triglycerides: >5000 mg/dL — ABNORMAL HIGH (ref <150)

## 2022-05-07 LAB — MRSA NEXT GEN BY PCR, NASAL: MRSA by PCR Next Gen: NOT DETECTED

## 2022-05-07 LAB — MAGNESIUM: Magnesium: 2.3 mg/dL (ref 1.7–2.4)

## 2022-05-07 LAB — CBG MONITORING, ED
Glucose-Capillary: 371 mg/dL — ABNORMAL HIGH (ref 70–99)
Glucose-Capillary: 344 mg/dL — ABNORMAL HIGH (ref 70–99)
Glucose-Capillary: 329 mg/dL — ABNORMAL HIGH (ref 70–99)

## 2022-05-07 LAB — PHOSPHORUS: Phosphorus: 1.9 mg/dL — ABNORMAL LOW (ref 2.5–4.6)

## 2022-05-07 LAB — LDL CHOLESTEROL, DIRECT

## 2022-05-07 MED ORDER — SODIUM CHLORIDE 0.9 % IV SOLN
1.0000 g | Freq: Three times a day (TID) | INTRAVENOUS | Status: DC
  Administered 2022-05-07 – 2022-05-13 (×17): 1 g via INTRAVENOUS
  Filled 2022-05-07 (×20): qty 20

## 2022-05-07 MED ORDER — DEXTROSE 5 % IV SOLN: INTRAVENOUS | Status: DC

## 2022-05-07 MED ORDER — INSULIN GLARGINE-YFGN 100 UNIT/ML ~~LOC~~ SOLN
10.0000 [IU] | Freq: Every day | SUBCUTANEOUS | Status: DC
  Filled 2022-05-07: qty 0.1

## 2022-05-07 MED ORDER — LACTATED RINGERS IV BOLUS: 1000.0000 mL | Freq: Once | INTRAVENOUS | Status: DC

## 2022-05-07 MED ORDER — HYDROMORPHONE HCL 1 MG/ML IJ SOLN
1.5000 mg | INTRAMUSCULAR | Status: DC | PRN
  Administered 2022-05-07 – 2022-05-08 (×5): 1.5 mg via INTRAVENOUS
  Filled 2022-05-07 (×5): qty 2

## 2022-05-07 MED ORDER — METFORMIN HCL 500 MG PO TABS: 1000.0000 mg | ORAL_TABLET | Freq: Every day | ORAL | Status: DC

## 2022-05-07 MED ORDER — ACETAMINOPHEN 650 MG RE SUPP: 650.0000 mg | Freq: Four times a day (QID) | RECTAL | Status: DC | PRN

## 2022-05-07 MED ORDER — PIPERACILLIN-TAZOBACTAM 3.375 G IVPB 30 MIN
3.3750 g | Freq: Three times a day (TID) | INTRAVENOUS | Status: DC
  Administered 2022-05-07: 3.375 g via INTRAVENOUS
  Filled 2022-05-07 (×3): qty 50

## 2022-05-07 MED ORDER — ONDANSETRON HCL 4 MG PO TABS
4.0000 mg | ORAL_TABLET | Freq: Four times a day (QID) | ORAL | Status: DC | PRN
  Administered 2022-05-07: 4 mg via ORAL
  Filled 2022-05-07: qty 1

## 2022-05-07 MED ORDER — POTASSIUM PHOSPHATES 15 MMOLE/5ML IV SOLN
30.0000 mmol | Freq: Once | INTRAVENOUS | Status: AC
  Administered 2022-05-07: 30 mmol via INTRAVENOUS
  Filled 2022-05-07: qty 10

## 2022-05-07 MED ORDER — FENOFIBRATE 160 MG PO TABS
160.0000 mg | ORAL_TABLET | Freq: Every day | ORAL | Status: DC
  Administered 2022-05-07 – 2022-05-13 (×7): 160 mg via ORAL
  Filled 2022-05-07 (×7): qty 1

## 2022-05-07 MED ORDER — SODIUM CHLORIDE 0.9 % IV BOLUS
1000.0000 mL | Freq: Once | INTRAVENOUS | Status: AC
  Administered 2022-05-07: 1000 mL via INTRAVENOUS

## 2022-05-07 MED ORDER — HYDROMORPHONE HCL 1 MG/ML IJ SOLN
1.0000 mg | Freq: Once | INTRAMUSCULAR | Status: AC
  Administered 2022-05-07: 1 mg via INTRAVENOUS
  Filled 2022-05-07: qty 1

## 2022-05-07 MED ORDER — ATORVASTATIN CALCIUM 40 MG PO TABS
80.0000 mg | ORAL_TABLET | Freq: Every day | ORAL | Status: DC
  Administered 2022-05-07 – 2022-05-12 (×6): 80 mg via ORAL
  Filled 2022-05-07 (×6): qty 2

## 2022-05-07 MED ORDER — NALOXONE HCL 0.4 MG/ML IJ SOLN: 0.4000 mg | INTRAMUSCULAR | Status: DC | PRN

## 2022-05-07 MED ORDER — CHLORHEXIDINE GLUCONATE CLOTH 2 % EX PADS
6.0000 | MEDICATED_PAD | Freq: Every day | CUTANEOUS | Status: DC
  Administered 2022-05-07: 6 via TOPICAL

## 2022-05-07 MED ORDER — IOHEXOL 350 MG/ML SOLN
100.0000 mL | Freq: Once | INTRAVENOUS | Status: AC | PRN
  Administered 2022-05-07: 100 mL via INTRAVENOUS

## 2022-05-07 MED ORDER — LACTATED RINGERS IV SOLN: INTRAVENOUS | Status: DC

## 2022-05-07 MED ORDER — ORAL CARE MOUTH RINSE: 15.0000 mL | OROMUCOSAL | Status: DC | PRN

## 2022-05-07 MED ORDER — ACETAMINOPHEN 325 MG PO TABS
650.0000 mg | ORAL_TABLET | Freq: Four times a day (QID) | ORAL | Status: DC | PRN
  Administered 2022-05-07 – 2022-05-12 (×9): 650 mg via ORAL
  Filled 2022-05-07 (×9): qty 2

## 2022-05-07 MED ORDER — POTASSIUM CHLORIDE 10 MEQ/100ML IV SOLN: 10.0000 meq | INTRAVENOUS | Status: DC

## 2022-05-07 MED ORDER — ONDANSETRON HCL 4 MG/2ML IJ SOLN
4.0000 mg | Freq: Four times a day (QID) | INTRAMUSCULAR | Status: DC | PRN
  Administered 2022-05-07 – 2022-05-10 (×2): 4 mg via INTRAVENOUS
  Filled 2022-05-07 (×2): qty 2

## 2022-05-07 MED ORDER — ENOXAPARIN SODIUM 60 MG/0.6ML IJ SOSY
60.0000 mg | PREFILLED_SYRINGE | INTRAMUSCULAR | Status: DC
  Administered 2022-05-07 – 2022-05-13 (×7): 60 mg via SUBCUTANEOUS
  Filled 2022-05-07 (×7): qty 0.6

## 2022-05-07 MED ORDER — POTASSIUM CHLORIDE CRYS ER 20 MEQ PO TBCR
40.0000 meq | EXTENDED_RELEASE_TABLET | Freq: Once | ORAL | Status: AC
  Administered 2022-05-07: 40 meq via ORAL
  Filled 2022-05-07: qty 2

## 2022-05-07 MED ORDER — PANTOPRAZOLE SODIUM 40 MG IV SOLR
40.0000 mg | INTRAVENOUS | Status: DC
  Administered 2022-05-07 – 2022-05-13 (×7): 40 mg via INTRAVENOUS
  Filled 2022-05-07 (×7): qty 10

## 2022-05-07 MED ORDER — HYDROMORPHONE HCL 1 MG/ML IJ SOLN
1.0000 mg | INTRAMUSCULAR | Status: DC | PRN
  Administered 2022-05-07 (×4): 1 mg via INTRAVENOUS
  Filled 2022-05-07 (×4): qty 1

## 2022-05-07 MED ORDER — METOPROLOL SUCCINATE ER 25 MG PO TB24
50.0000 mg | ORAL_TABLET | Freq: Every day | ORAL | Status: DC
  Administered 2022-05-08 – 2022-05-13 (×6): 50 mg via ORAL
  Filled 2022-05-07 (×6): qty 2

## 2022-05-07 MED ORDER — INSULIN (MYXREDLIN) INFUSION FOR HYPERTRIGLYCERIDEMIA
0.1000 [IU]/kg/h | INTRAVENOUS | Status: DC
  Administered 2022-05-07 – 2022-05-10 (×8): 0.1 [IU]/kg/h via INTRAVENOUS
  Filled 2022-05-07 (×10): qty 100

## 2022-05-07 MED ORDER — LACTATED RINGERS IV BOLUS
2000.0000 mL | Freq: Once | INTRAVENOUS | Status: AC
  Administered 2022-05-07: 2000 mL via INTRAVENOUS

## 2022-05-07 MED ORDER — METFORMIN HCL 500 MG PO TABS: 500.0000 mg | ORAL_TABLET | Freq: Every day | ORAL | Status: DC

## 2022-05-07 NOTE — ED Notes (Signed)
Called [facility] to transport patient to [facility] 5E room# 1519 ?

## 2022-05-07 NOTE — ED Notes (Signed)
Patient back from CT.

## 2022-05-07 NOTE — Progress Notes (Signed)
Patient arrived to unit alert and oriented. He is being transferred to ICU d/t level change in level of care.

## 2022-05-07 NOTE — H&P (Signed)
History and Physical    Patient: Douglas [name]. [id number] DOB: [birth year] DOA: 05/06/2022 DOS: the patient was seen and examined on 05/07/2022 PCP: Tomasa Hose, NP  Patient coming from: Home  Chief Complaint:  Chief Complaint  Patient presents with   Abdominal Pain   HPI: Douglas [name]. is a 37 y.o. male with medical history significant of type 2 diabetes, class II obesity, hyperlipidemia/hypertriglyceridemia, hypertension, right shoulder arthropathy, rectal bleeding, history of pancreatitis, history of peripancreatic fluid collection who presented to the emergency department complaints of abdominal pain after he ate a cheeseburger with fries 2 days ago.  He had an episode of emesis.He denied fever, chills, rhinorrhea, sore throat, wheezing or hemoptysis.  No chest pain, palpitations, diaphoresis, PND, orthopnea or pitting edema of the lower extremities.  No abdominal pain, nausea, emesis, diarrhea, constipation, melena or hematochezia.  No flank pain, dysuria, frequency or hematuria.  No polyuria, polydipsia, polyphagia or blurred vision.  Lab work: His urinalysis showed an increase a specific gravity of 1.0 38, glucosuria more than 1000, ketonuria 15 and proteinuria 30 mg/dL.  There was small hemoglobinuria.  CBCs are white count 12.0, hemoglobin 16.9 g/dL platelets 246.  CMP with a sodium 127, chloride of 97 and CO2 of 15 mmol/L with a normal anion gap.  Corrected sodium is 136 mmol/L.  Normal renal function.  Glucose was 460 mg/dL.  Total protein 8.5 g/dL, normal albumin level, AST 42, normal ALT, alkaline phosphatase 182 and normal bilirubin.  Lipid panel showed a cholesterol level of more than 1000 and triglycerides of more than 5000 mg/dL.  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  No loculated peripancreatic fluid collections or evidence of peripancreatic  necrosis.  Follow-up CT or MRI imaging in 3 to 5 days may be helpful to assess evolution of the area of threatened necrosis involving the tail of the pancreas.  There is mild hepatic asteatosis.  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  ED course: Initial vital signs were temperature98.3 F, pulse 114, respiration 18, BP 165/107 mmHg O2 sat 98% on room air.  The patient received 2000 mL of normal saline bolus, KCl 40 mill equivalents p.o. x 1, ondansetron 4 mg IVP, hydromorphone 1 mg IVP x 2 and was started on an insulin infusion.   Review of Systems: As mentioned in the history of present illness. All other systems reviewed and are negative. Past Medical History:  Diagnosis Date   Diabetes ([place])    Hypercholesteremia    Hypertension    Pancreatitis 08/2017   Past Surgical History:  Procedure Laterality Date   TONSILLECTOMY     WISDOM TOOTH EXTRACTION     Social History:  reports that he quit smoking about 19 years ago. His smoking use included cigarettes. He has never used smokeless tobacco. He reports current alcohol use. He reports that he does not use drugs.  No Known Allergies  Family History  Problem Relation Age of Onset   Hypercholesterolemia Mother    Hypertension Mother    Diabetes Father    Colon cancer Neg Hx    Esophageal cancer Neg Hx    Inflammatory bowel disease Neg Hx    Liver disease Neg Hx    Pancreatic cancer Neg Hx    Rectal cancer Neg Hx    Stomach cancer Neg Hx     Prior to Admission medications   Medication Sig Start Date End Date  Taking? Authorizing Provider  atorvastatin (LIPITOR) 80 MG tablet Take 1 tablet (80 mg total) by mouth daily at 6 PM. 09/02/19   Little Ishikawa, MD  blood glucose meter kit and supplies KIT Dispense based on patient and insurance preference. Use up to four times daily as directed. (FOR ICD-9 250.00, 250.01). 09/01/19   Little Ishikawa, MD  blood glucose meter kit and supplies Dispense based on  patient and insurance preference. Use up to four times daily as directed. (FOR ICD-10 E10.9, E11.9). 09/22/17   Mikhail, Velta Addison, DO  fenofibrate 160 MG tablet Take 160 mg by mouth daily. 10/30/16   [provider]  ibuprofen (ADVIL,MOTRIN) 800 MG tablet Take 800 mg by mouth daily as needed for pain. 09/24/16   [provider]  insulin glargine (LANTUS) 100 UNIT/ML Solostar Pen Inject 10 Units into the skin daily. 09/01/19   Little Ishikawa, MD  metFORMIN (GLUCOPHAGE) 500 MG tablet Take 2 tablets in the AM and 1 tablet in the PM    [provider]  metoprolol succinate (TOPROL-XL) 50 MG 24 hr tablet Take 1 tablet (50 mg total) by mouth daily. 09/02/19   Little Ishikawa, MD  pantoprazole (PROTONIX) 40 MG tablet Take 40 mg by mouth daily. 06/26/19   [provider]    Physical Exam: Vitals:   05/07/22 0445 05/07/22 0500 05/07/22 0510 05/07/22 0601  BP: (!) 108/50 (!) 109/54 (!) 120/56 (!) 92/44  Pulse: 92 93 96 95  Resp: (!) 23 (!) 22 20 20   Temp:  98.9 F (37.2 C)  (!) 101.3 F (38.5 C)  TempSrc:  Oral  Oral  SpO2: 94% 95% 96%   Weight:      Height:       Physical Exam Vitals and nursing note reviewed.  Constitutional:      Appearance: He is well-developed. He is obese.  HENT:     Head: Normocephalic.     Nose: No rhinorrhea.     Mouth/Throat:     Mouth: Mucous membranes are moist.  Eyes:     General: No scleral icterus.    Pupils: Pupils are equal, round, and reactive to light.  Cardiovascular:     Rate and Rhythm: Normal rate and regular rhythm.  Pulmonary:     Effort: Pulmonary effort is normal.     Breath sounds: Normal breath sounds.  Abdominal:     Palpations: Abdomen is soft.     Tenderness: There is abdominal tenderness in the epigastric area. There is no right CVA tenderness, left CVA tenderness, guarding or rebound.  Musculoskeletal:     Cervical back: Neck supple.     Right lower leg: No edema.     Left lower leg: No edema.   Skin:    General: Skin is warm and dry.  Neurological:     General: No focal deficit present.     Mental Status: He is alert and oriented to person, place, and time.  Psychiatric:        Mood and Affect: Mood normal.    Data Reviewed:  Results are pending, will review when available.  Assessment and Plan: Principal Problem:   Acute pancreatitis With imaging signs of necrosis. Stepdown/inpatient. Continue IV fluids. Keep n.p.o. for now. Analgesics as needed. Antiemetics as needed. Pantoprazole 40 mg IVP daily. Prophylactic antibiotics. Follow CBC, CMP and lipase in AM. GI has been consulted and will evaluate in AM..  Active Problems:   AKI (acute kidney injury) ([place]) Continue  IV fluids. Avoid hypotension. Avoid nephrotoxins. Monitor intake and output. Monitor renal function electrolytes.    Hypertension Continue metoprolol 50 mg p.o. daily. Monitor blood pressure and heart rate.    Hypertriglyceridemia Continue atorvastatin 80 mg p.o. daily. Continue fenofibrate 160 mg p.o. daily. Continue insulin infusion. Follow-up triglycerides twice a day.    Type 2 diabetes mellitus without complication (HCC) Currently n.p.o. on insulin infusion for hypertriglyceridemia.. Continue IV fluids and monitor CBG.    Class II obesity Current BMI 39.2 kg/m. Lifestyle modifications. Follow-up with primary, endocrinology. Consider bariatric clinic referral as an outpatient.      Advance Care Planning:   Code Status: Full Code   Consults: [facility] Silvano Rusk, MD).  Family Communication:   Severity of Illness: The appropriate patient status for this patient is INPATIENT. Inpatient status is judged to be reasonable and necessary in order to provide the required intensity of service to ensure the patient's safety. The patient's presenting symptoms, physical exam findings, and initial radiographic and laboratory data in the context of their chronic comorbidities is felt  to place them at high risk for further clinical deterioration. Furthermore, it is not anticipated that the patient will be medically stable for discharge from the hospital within 2 midnights of admission.   * I certify that at the point of admission it is my clinical judgment that the patient will require inpatient hospital care spanning beyond 2 midnights from the point of admission due to high intensity of service, high risk for further deterioration and high frequency of surveillance required.*  Author: Reubin Milan, MD 05/07/2022 7:35 AM  For on call review [email or web address].   This document was prepared using Dragon voice recognition software and may contain some unintended transcription errors.

## 2022-05-07 NOTE — Progress Notes (Signed)
Hospitalist Transfer Note:  Transferring facility: [facility] Requesting provider: Dr. Calvert Cantor (EDP at [facility]) Reason for transfer: admission for further evaluation and management of acute pancreatitis in setting of hypertriglyceridemia.     37  year old male with medical history notable for familial hypertriglyceridemia, acute pancreatitis, who presented to [facility] ED complaining of abdominal pain over the last day, associated with nausea.   He is a reported history of hypertriglyceridemia induced acute pancreatitis on 2 prior occasions, most recent episode of which occurred 2 to 3 years ago.  He has been off of his outpatient medications for his familial hypertriglyceridemia over the course of the last 6 months leading up to recently reestablishing with his PCP a few weeks ago, at which time his antilipid regimen was reinitiated.  No report of any alcohol abuse.  Vital signs in the ED were notable for the following: Afebrile, normotensive.  Initial mild tachycardia resolved with IV fluids.  Labs were notable for lipase of 500.  Triglyceride level too high to count.  Imaging notable for CT abdomen/pelvis which reportedly showed evidence consistent with acute pancreatitis.  Medications administered prior to transfer included the following: Insulin drip.   Subsequently, I accepted this patient for transfer for inpatient admission to a MedSurg bed at [facility] or [facility] (first available) for further work-up and management of the above.     Check [email or web address] for on-call coverage.   Nursing staff, Please call [facility] number on Amion as soon as patient's arrival, so appropriate admitting provider can evaluate the pt.     Babs Bertin, DO Hospitalist

## 2022-05-07 NOTE — Progress Notes (Signed)
  Transition of Care [facility]) Screening Note   Patient Details  Name: Douglas [name]. Date of Birth: 1986-01-27   [facility]) CM/SW Contact:    Roseanne Kaufman, RN Phone Number: 05/07/2022, 3:48 PM    Transition of Care Department [facility]) has reviewed patient and no TOC needs have been identified at this time. We will continue to monitor patient advancement through interdisciplinary progression rounds. If new patient transition needs arise, please place a TOC consult.

## 2022-05-07 NOTE — Inpatient Diabetes Management (Signed)
Inpatient Diabetes Program Recommendations  AACE/ADA: New Consensus Statement on Inpatient Glycemic Control (2015)  Target Ranges:  Prepandial:   less than 140 mg/dL      Peak postprandial:   less than 180 mg/dL (1-2 hours)      Critically ill patients:  140 - 180 mg/dL   Lab Results  Component Value Date   GLUCAP 164 (H) 05/07/2022   HGBA1C >15.5 (H) 08/29/2019    Review of Glycemic Control  Diabetes history: DM2 Outpatient Diabetes medications:  Lantus 10 QD, metformin 1000 in am and 500 QHS Current orders for Inpatient glycemic control: IV insulin 12.7 units/H for hypertriglyceridemia-induced pancreatitis  37 year old with acute pancreatitis, hypertriglyceridemia. Triglycerides > 5000. Has been off of his outpatient medications for his familial hypertriglyceridemia over the course of 6 months. Just reestablished care with PCP, now on meds.   HgbA1C . 15.5%  Inpatient Diabetes Program Recommendations:    Continue IV insulin as ordered.  Will need both basal and bolus insulin when IV is d/ced.  Spoke briefly with pt at bedside.  Confirms home meds of Lantus 10 QD and metformin 1000 mg QAM and 500 QHS.  Will f/u with pt as labs improve.  Thank you. Lorenda Peck, RD, LDN, [facility] Inpatient Diabetes Coordinator [phone]

## 2022-05-08 DIAGNOSIS — N179 Acute kidney failure, unspecified: Secondary | ICD-10-CM

## 2022-05-08 DIAGNOSIS — E781 Pure hyperglyceridemia: Secondary | ICD-10-CM

## 2022-05-08 DIAGNOSIS — E669 Obesity, unspecified: Secondary | ICD-10-CM

## 2022-05-08 DIAGNOSIS — E119 Type 2 diabetes mellitus without complications: Secondary | ICD-10-CM

## 2022-05-08 LAB — MAGNESIUM: Magnesium: 2.2 mg/dL (ref 1.7–2.4)

## 2022-05-08 LAB — HIV ANTIBODY (ROUTINE TESTING W REFLEX): HIV Screen 4th Generation wRfx: NONREACTIVE

## 2022-05-08 LAB — COMPREHENSIVE METABOLIC PANEL
ALT: 27 U/L (ref 0–44)
AST: 52 U/L — ABNORMAL HIGH (ref 15–41)
Albumin: 3.1 g/dL — ABNORMAL LOW (ref 3.5–5.0)
Alkaline Phosphatase: 66 U/L (ref 38–126)
Anion gap: 12 (ref 5–15)
BUN: 9 mg/dL (ref 6–20)
CO2: 17 mmol/L — ABNORMAL LOW (ref 22–32)
Calcium: 7.8 mg/dL — ABNORMAL LOW (ref 8.9–10.3)
Chloride: 103 mmol/L (ref 98–111)
Creatinine, Ser: 1.34 mg/dL — ABNORMAL HIGH (ref 0.61–1.24)
GFR, Estimated: >60 mL/min (ref >60)
Glucose, Bld: 152 mg/dL — ABNORMAL HIGH (ref 70–99)
Potassium: 4.1 mmol/L (ref 3.5–5.1)
Sodium: 132 mmol/L — ABNORMAL LOW (ref 135–145)
Total Bilirubin: 1.5 mg/dL — ABNORMAL HIGH (ref 0.3–1.2)
Total Protein: 5.7 g/dL — ABNORMAL LOW (ref 6.5–8.1)

## 2022-05-08 LAB — CBC
HCT: 44 % (ref 39.0–52.0)
Hemoglobin: 14.4 g/dL (ref 13.0–17.0)
MCH: 28.7 pg (ref 26.0–34.0)
MCHC: 32.7 g/dL (ref 30.0–36.0)
MCV: 87.8 fL (ref 80.0–100.0)
Platelets: 228 10*3/uL (ref 150–400)
RBC: 5.01 MIL/uL (ref 4.22–5.81)
RDW: 13.8 % (ref 11.5–15.5)
WBC: 9.8 10*3/uL (ref 4.0–10.5)
nRBC: 0 % (ref 0.0–0.2)

## 2022-05-08 LAB — GLUCOSE, CAPILLARY
Glucose-Capillary: 100 mg/dL — ABNORMAL HIGH (ref 70–99)
Glucose-Capillary: 101 mg/dL — ABNORMAL HIGH (ref 70–99)
Glucose-Capillary: 107 mg/dL — ABNORMAL HIGH (ref 70–99)
Glucose-Capillary: 110 mg/dL — ABNORMAL HIGH (ref 70–99)
Glucose-Capillary: 115 mg/dL — ABNORMAL HIGH (ref 70–99)
Glucose-Capillary: 124 mg/dL — ABNORMAL HIGH (ref 70–99)
Glucose-Capillary: 127 mg/dL — ABNORMAL HIGH (ref 70–99)
Glucose-Capillary: 141 mg/dL — ABNORMAL HIGH (ref 70–99)
Glucose-Capillary: 144 mg/dL — ABNORMAL HIGH (ref 70–99)
Glucose-Capillary: 159 mg/dL — ABNORMAL HIGH (ref 70–99)
Glucose-Capillary: 165 mg/dL — ABNORMAL HIGH (ref 70–99)
Glucose-Capillary: 184 mg/dL — ABNORMAL HIGH (ref 70–99)
Glucose-Capillary: 189 mg/dL — ABNORMAL HIGH (ref 70–99)
Glucose-Capillary: 63 mg/dL — ABNORMAL LOW (ref 70–99)
Glucose-Capillary: 64 mg/dL — ABNORMAL LOW (ref 70–99)
Glucose-Capillary: 76 mg/dL (ref 70–99)
Glucose-Capillary: 83 mg/dL (ref 70–99)
Glucose-Capillary: 88 mg/dL (ref 70–99)
Glucose-Capillary: 89 mg/dL (ref 70–99)
Glucose-Capillary: 91 mg/dL (ref 70–99)
Glucose-Capillary: 91 mg/dL (ref 70–99)
Glucose-Capillary: 96 mg/dL (ref 70–99)
Glucose-Capillary: 96 mg/dL (ref 70–99)

## 2022-05-08 LAB — LIPASE, BLOOD: Lipase: 114 U/L — ABNORMAL HIGH (ref 11–51)

## 2022-05-08 LAB — TRIGLYCERIDES
Triglycerides: 1886 mg/dL — ABNORMAL HIGH (ref <150)
Triglycerides: 2034 mg/dL — ABNORMAL HIGH (ref <150)

## 2022-05-08 LAB — PHOSPHORUS: Phosphorus: 2.5 mg/dL (ref 2.5–4.6)

## 2022-05-08 MED ORDER — CHLORHEXIDINE GLUCONATE CLOTH 2 % EX PADS
6.0000 | MEDICATED_PAD | Freq: Every day | CUTANEOUS | Status: DC
  Administered 2022-05-08 – 2022-05-12 (×5): 6 via TOPICAL

## 2022-05-08 MED ORDER — OXYCODONE HCL 5 MG PO TABS
5.0000 mg | ORAL_TABLET | ORAL | Status: DC | PRN
  Administered 2022-05-08 – 2022-05-12 (×12): 10 mg via ORAL
  Filled 2022-05-08 (×12): qty 2

## 2022-05-08 MED ORDER — HYDROMORPHONE HCL 1 MG/ML IJ SOLN
1.0000 mg | INTRAMUSCULAR | Status: DC | PRN
  Administered 2022-05-08 – 2022-05-10 (×6): 1 mg via INTRAVENOUS
  Filled 2022-05-08 (×6): qty 1

## 2022-05-08 MED ORDER — DEXTROSE 50 % IV SOLN
50.0000 mL | Freq: Once | INTRAVENOUS | Status: AC
  Administered 2022-05-08: 50 mL via INTRAVENOUS
  Filled 2022-05-08: qty 50

## 2022-05-08 MED ORDER — POLYETHYLENE GLYCOL 3350 17 G PO PACK
17.0000 g | PACK | Freq: Two times a day (BID) | ORAL | Status: DC | PRN
  Administered 2022-05-10 – 2022-05-12 (×2): 17 g via ORAL
  Filled 2022-05-08 (×2): qty 1

## 2022-05-08 NOTE — Progress Notes (Addendum)
Hypoglycemic Event  CBG: 63  Treatment: 4 oz juice/soda  Symptoms: None ; Insulin paused for an hour Follow-up CBG: Time: Next 1 hr check CBG Result: 115  Possible Reasons for Event: Inadequate meal intake  Comments/MD notified: A. Zebedee Iba NP  Increased D% IVF rate and restarted insulin gtt    Jerrye Bushy Dionne

## 2022-05-08 NOTE — Plan of Care (Signed)
Discussed with patient plan of care for the evening, pain management and medications with some teach back displayed.  Also update mom on his progress over the phone.  Problem: Education: Goal: Knowledge of General Education information will improve Description: Including pain rating scale, medication(s)/side effects and non-pharmacologic comfort measures Outcome: Progressing   Problem: Health Behavior/Discharge Planning: Goal: Ability to manage health-related needs will improve Outcome: Progressing

## 2022-05-08 NOTE — Consult Note (Signed)
Reason for Consult: Hypertriglyceridemia-induced pancreatitis Referring Physician: [facility]  Robley Ilda Basset. HPI: This is a 37 year old male with an PMH of hypertriglyceridemia-induced pancreatitis, DM, HTN, and hepatic steatosis admitted for abdominal pain.  He states that his pain started two days prior to admission when he ate a cheeseburger and fries.  His admission TG was at >5,000 and his cholesterol was at >1000.  A CT scan of the abdomen showed that he has an acute pancreatitis with the possibility of an early necrosis in the tail of the pancreas.  The initial diagnosis of pancreatitis was in 2019 and he had a second recurrence in 2021.  Both of those admissions required ICU treatment.  He reports being "hard headed" about taking his medications.  As an outpatient he is on fenofibrate, atorvastatin, and Vascepa.  Currently he reports reasonable management of his pain.  He has a GI with [facility], Dr. Shirleen Schirmer.  Past Medical History:  Diagnosis Date   Diabetes ([place])    Hypercholesteremia    Hypertension    Pancreatitis 08/2017    Past Surgical History:  Procedure Laterality Date   TONSILLECTOMY     WISDOM TOOTH EXTRACTION      Family History  Problem Relation Age of Onset   Hypercholesterolemia Mother    Hypertension Mother    Diabetes Father    Colon cancer Neg Hx    Esophageal cancer Neg Hx    Inflammatory bowel disease Neg Hx    Liver disease Neg Hx    Pancreatic cancer Neg Hx    Rectal cancer Neg Hx    Stomach cancer Neg Hx     Social History:  reports that he quit smoking about 19 years ago. His smoking use included cigarettes. He has never used smokeless tobacco. He reports current alcohol use. He reports that he does not use drugs.  Allergies: No Known Allergies  Medications: Scheduled:  atorvastatin  80 mg Oral q1800   Chlorhexidine Gluconate Cloth  6 each Topical Daily   enoxaparin (LOVENOX) injection  60 mg Subcutaneous Q24H    fenofibrate  160 mg Oral Daily   metoprolol succinate  50 mg Oral Daily   pantoprazole (PROTONIX) IV  40 mg Intravenous Q24H   Continuous:  dextrose 125 mL/hr at 05/08/22 1100   insulin 0.1 Units/kg/hr (05/08/22 1100)   lactated ringers 100 mL/hr at 05/08/22 1100   meropenem (MERREM) IV Stopped (05/08/22 [id number])    Results for orders placed or performed during the hospital encounter of 05/06/22 (from the past 24 hour(s))  Glucose, capillary     Status: Abnormal   Collection Time: 05/07/22 11:13 AM  Result Value Ref Range   Glucose-Capillary 106 (H) 70 - 99 mg/dL   Comment 1 Notify RN    Comment 2 Document in Chart   Glucose, capillary     Status: Abnormal   Collection Time: 05/07/22 12:01 PM  Result Value Ref Range   Glucose-Capillary 148 (H) 70 - 99 mg/dL   Comment 1 Notify RN    Comment 2 Document in Chart   Triglycerides     Status: Abnormal   Collection Time: 05/07/22 12:02 PM  Result Value Ref Range   Triglycerides >5,000 (H) <150 mg/dL  Glucose, capillary     Status: Abnormal   Collection Time: 05/07/22  1:09 PM  Result Value Ref Range   Glucose-Capillary 197 (H) 70 - 99 mg/dL   Comment 1 Notify RN    Comment 2 Document  in Chart   Glucose, capillary     Status: Abnormal   Collection Time: 05/07/22  2:00 PM  Result Value Ref Range   Glucose-Capillary 208 (H) 70 - 99 mg/dL   Comment 1 Notify RN    Comment 2 Document in Chart   Glucose, capillary     Status: Abnormal   Collection Time: 05/07/22  3:04 PM  Result Value Ref Range   Glucose-Capillary 203 (H) 70 - 99 mg/dL   Comment 1 Notify RN    Comment 2 Document in Chart   Glucose, capillary     Status: Abnormal   Collection Time: 05/07/22  4:05 PM  Result Value Ref Range   Glucose-Capillary 162 (H) 70 - 99 mg/dL   Comment 1 Notify RN    Comment 2 Document in Chart   Triglycerides     Status: Abnormal   Collection Time: 05/07/22  5:01 PM  Result Value Ref Range   Triglycerides >5,000 (H) <150 mg/dL  Glucose,  capillary     Status: Abnormal   Collection Time: 05/07/22  5:03 PM  Result Value Ref Range   Glucose-Capillary 172 (H) 70 - 99 mg/dL   Comment 1 Notify RN    Comment 2 Document in Chart   Glucose, capillary     Status: Abnormal   Collection Time: 05/07/22  6:02 PM  Result Value Ref Range   Glucose-Capillary 169 (H) 70 - 99 mg/dL   Comment 1 Notify RN    Comment 2 Document in Chart   Glucose, capillary     Status: Abnormal   Collection Time: 05/07/22  6:50 PM  Result Value Ref Range   Glucose-Capillary 167 (H) 70 - 99 mg/dL   Comment 1 Notify RN    Comment 2 Document in Chart   Glucose, capillary     Status: Abnormal   Collection Time: 05/07/22  7:57 PM  Result Value Ref Range   Glucose-Capillary 172 (H) 70 - 99 mg/dL  Glucose, capillary     Status: Abnormal   Collection Time: 05/07/22  9:27 PM  Result Value Ref Range   Glucose-Capillary 164 (H) 70 - 99 mg/dL  Glucose, capillary     Status: Abnormal   Collection Time: 05/07/22 10:02 PM  Result Value Ref Range   Glucose-Capillary 130 (H) 70 - 99 mg/dL  Glucose, capillary     Status: Abnormal   Collection Time: 05/07/22 10:59 PM  Result Value Ref Range   Glucose-Capillary 119 (H) 70 - 99 mg/dL  Glucose, capillary     Status: Abnormal   Collection Time: 05/08/22 12:04 AM  Result Value Ref Range   Glucose-Capillary 100 (H) 70 - 99 mg/dL  Glucose, capillary     Status: None   Collection Time: 05/08/22  1:01 AM  Result Value Ref Range   Glucose-Capillary 91 70 - 99 mg/dL  Glucose, capillary     Status: Abnormal   Collection Time: 05/08/22  2:16 AM  Result Value Ref Range   Glucose-Capillary 63 (L) 70 - 99 mg/dL  Glucose, capillary     Status: Abnormal   Collection Time: 05/08/22  3:10 AM  Result Value Ref Range   Glucose-Capillary 115 (H) 70 - 99 mg/dL  Glucose, capillary     Status: Abnormal   Collection Time: 05/08/22  4:11 AM  Result Value Ref Range   Glucose-Capillary 159 (H) 70 - 99 mg/dL  Lipase, blood      Status: Abnormal   Collection Time: 05/08/22  4:56  AM  Result Value Ref Range   Lipase 114 (H) 11 - 51 U/L  HIV Antibody (routine testing w rflx)     Status: None   Collection Time: 05/08/22  4:56 AM  Result Value Ref Range   HIV Screen 4th Generation wRfx Non Reactive Non Reactive  Comprehensive metabolic panel     Status: Abnormal   Collection Time: 05/08/22  4:56 AM  Result Value Ref Range   Sodium 132 (L) 135 - 145 mmol/L   Potassium 4.1 3.5 - 5.1 mmol/L   Chloride 103 98 - 111 mmol/L   CO2 17 (L) 22 - 32 mmol/L   Glucose, Bld 152 (H) 70 - 99 mg/dL   BUN 9 6 - 20 mg/dL   Creatinine, Ser 1.34 (H) 0.61 - 1.24 mg/dL   Calcium 7.8 (L) 8.9 - 10.3 mg/dL   Total Protein 5.7 (L) 6.5 - 8.1 g/dL   Albumin 3.1 (L) 3.5 - 5.0 g/dL   AST 52 (H) 15 - 41 U/L   ALT 27 0 - 44 U/L   Alkaline Phosphatase 66 38 - 126 U/L   Total Bilirubin 1.5 (H) 0.3 - 1.2 mg/dL   GFR, Estimated >60 >60 mL/min   Anion gap 12 5 - 15  CBC     Status: None   Collection Time: 05/08/22  4:56 AM  Result Value Ref Range   WBC 9.8 4.0 - 10.5 K/uL   RBC 5.01 4.22 - 5.81 MIL/uL   Hemoglobin 14.4 13.0 - 17.0 g/dL   HCT 44.0 39.0 - 52.0 %   MCV 87.8 80.0 - 100.0 fL   MCH 28.7 26.0 - 34.0 pg   MCHC 32.7 30.0 - 36.0 g/dL   RDW 13.8 11.5 - 15.5 %   Platelets 228 150 - 400 K/uL   nRBC 0.0 0.0 - 0.2 %  Triglycerides     Status: Abnormal   Collection Time: 05/08/22  4:56 AM  Result Value Ref Range   Triglycerides 1,886 (H) <150 mg/dL  Magnesium     Status: None   Collection Time: 05/08/22  4:56 AM  Result Value Ref Range   Magnesium 2.2 1.7 - 2.4 mg/dL  Phosphorus     Status: None   Collection Time: 05/08/22  4:56 AM  Result Value Ref Range   Phosphorus 2.5 2.5 - 4.6 mg/dL  Glucose, capillary     Status: Abnormal   Collection Time: 05/08/22  5:15 AM  Result Value Ref Range   Glucose-Capillary 144 (H) 70 - 99 mg/dL  Glucose, capillary     Status: Abnormal   Collection Time: 05/08/22  6:09 AM  Result Value Ref  Range   Glucose-Capillary 141 (H) 70 - 99 mg/dL  Glucose, capillary     Status: Abnormal   Collection Time: 05/08/22  7:02 AM  Result Value Ref Range   Glucose-Capillary 124 (H) 70 - 99 mg/dL  Glucose, capillary     Status: None   Collection Time: 05/08/22  7:51 AM  Result Value Ref Range   Glucose-Capillary 89 70 - 99 mg/dL  Glucose, capillary     Status: None   Collection Time: 05/08/22  9:05 AM  Result Value Ref Range   Glucose-Capillary 83 70 - 99 mg/dL   Comment 1 Notify RN    Comment 2 Document in Chart   Glucose, capillary     Status: None   Collection Time: 05/08/22  9:55 AM  Result Value Ref Range   Glucose-Capillary 76 70 -  99 mg/dL     CT Abdomen Pelvis W Contrast  Result Date: 05/07/2022 CLINICAL DATA:  Left lower quadrant abdominal pain EXAM: CT ABDOMEN AND PELVIS WITH CONTRAST TECHNIQUE: Multidetector CT imaging of the abdomen and pelvis was performed using the standard protocol following bolus administration of intravenous contrast. RADIATION DOSE REDUCTION: This exam was performed according to the departmental dose-optimization program which includes automated exposure control, adjustment of the mA and/or kV according to patient size and/or use of iterative reconstruction technique. CONTRAST:  142mL OMNIPAQUE IOHEXOL 350 MG/ML SOLN COMPARISON:  08/25/2019 FINDINGS: Lower chest: No acute abnormality. Hepatobiliary: Mild hepatic steatosis. No enhancing intrahepatic mass. No intra or extrahepatic biliary ductal dilation. Gallbladder unremarkable. Pancreas: There is extensive peripancreatic inflammatory stranding surrounding the body and tail the pancreas in keeping with changes of acute pancreatitis. No loculated peripancreatic fluid collections or evidence of peripancreatic necrosis. There is, however, notable hypoenhancement of the pancreatic tail in keeping with early changes of pancreatic necrosis. The pancreatic duct is not dilated. Adjacent to the region of parenchymal  hypoenhancement, the splenic vein is markedly narrowed but remains patent. Spleen: Unremarkable Adrenals/Urinary Tract: Adrenal glands are unremarkable. Kidneys are normal, without renal calculi, focal lesion, or hydronephrosis. Bladder is unremarkable. Stomach/Bowel: Stomach is within normal limits. Appendix appears normal. No evidence of bowel wall thickening, distention, or inflammatory changes. No free intraperitoneal gas or fluid. Vascular/Lymphatic: No significant vascular findings are present. No enlarged abdominal or pelvic lymph nodes. Reproductive: Prostate is unremarkable. Other: No abdominal wall hernia or abnormality. Musculoskeletal: Bilateral L5 pars defects are present without associated spondylolisthesis. Of the osseous structures are otherwise unremarkable. IMPRESSION: 1. 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 No loculated peripancreatic fluid collections or evidence of peripancreatic necrosis. Follow-up CT or MRI imaging in 3-5 days may be helpful to assess evolution of the area of threatened necrosis involving the tail of the pancreas. 2. Mild hepatic steatosis. 3. 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 Electronically Signed   By: Fidela Salisbury M.D.   On: 05/07/2022 02:12    ROS:  As stated above in the HPI otherwise negative.  Blood pressure 106/74, pulse 95, temperature 99.9 F (37.7 C), temperature source Oral, resp. rate (!) 21, height 6' (1.829 m), weight 131.1 kg, SpO2 97 %.    PE: Gen: NAD, Alert and Oriented, uncomfortable appearing HEENT:  [place]/AT, EOMI Neck: Supple, no LAD Lungs: CTA Bilaterally CV: RRR without M/G/R ABD: Soft, NTND, +BS Ext: No C/C/E  Assessment/Plan: 1) Hypertriglyceridemia-induced pancreatitis. 2) Possible early pancreatic necrosis. 3) Medication noncomplicance.   The patient is stable.  His temp was at 99.9 F and his WBC is normal.  He is progressing in the right  direction.  He was counseled about taking his medications on a routine basis and to improve his diet.  Early next week he needs to have a follow up CT scan to check the status of the necrosis.  If he has any worsening of his abdominal pain and/or fever, imaging will need to be performed sooner.  Plan: 1) Continue with IV hydration 1/5 mL/kg/hour. 2) Continue with pain medications. 3) Dr. Paulita Fujita will cover this weekend.  Takiah Maiden D 05/08/2022, 10:28 AM

## 2022-05-08 NOTE — Progress Notes (Signed)
Hypoglycemic Event  CBG: 64  Treatment: 4 oz juice/soda, Insulin paused for next hr  Symptoms: Sweaty  Follow-up CBG: Time: 2015 CBG Result: 88  Possible Reasons for Event: Inadequate meal intake  Comments/MD notified: A. Zebedee Iba, NP notified of this event. New orders given to administer 1 amp of D50 after follow-up CBG check.     Leonda Cristo Wynonia Musty

## 2022-05-08 NOTE — Progress Notes (Signed)
PROGRESS NOTE    Douglas [name].  [id number] DOB: 1985/07/17 DOA: 05/06/2022 PCP: Tomasa Hose, NP   Brief Narrative:  Douglas [name]. is a 37 y.o. male with medical history significant of type 2 diabetes, class II obesity, hyperlipidemia/hypertriglyceridemia, hypertension, right shoulder arthropathy, rectal bleeding, recurrent episodes of pancreatitis due to hypertriglyceridemia with peripancreatic fluid collection who presented to the emergency department complaints of abdominal pain after he ate a particularly fat/grease-heavy meal. CT abdomen/pelvis with contrast showing acute pancreatitis with changes of incipient pancreatic necrosis involving the pancreatic tail. Given elevated lipase patient was admitted to hospitalist service.  Assessment & Plan:   Principal Problem:   Acute pancreatitis Active Problems:   Hypertension   Hypertriglyceridemia   Type 2 diabetes mellitus without complication (HCC)   Class II obesity   AKI (acute kidney injury) ([place])  Acute recurrent pancreatitis with questionable necrosis on imaging Intractable abdominal pain -Patient has known hypertriglyceridemia with previous episodes of pancreatitis due to elevated triglyceride levels -Patient had a "cheat meal" which precipitated his current exacerbation -Lipase is downtrending appropriately -Triglycerides initially undetectably elevated, downtrending quite well -Continue IV fluids, IV insulin per protocol -Transition to clears, advance diet as tolerated -GI following, appreciate insight and recommendations -Wean down IV Dilaudid, initiate p.o. oxycodone, titrate appropriately and aggressively   AKI (acute kidney injury) ([place]) Continue IV fluids. Secondary to above, downtrending appropriately  Essential hypertension Likely exacerbated given above pain  Continue metoprolol 50 mg p.o. daily. Monitor blood pressure and heart rate.   Hypertriglyceridemia (Familial?) Continue  atorvastatin 80 mg p.o. daily. Continue fenofibrate 160 mg p.o. daily. Continue insulin infusion. Triglycerides >5000 at intake -downtrending appropriately, repeat 1886   Type 2 diabetes mellitus without complication ([place]) [id number] undetectably high 2021 >15.1 Current repeat A1c pending Transition to clear liquid diet, continue insulin drip, monitor for hypoglycemic events   Class II obesity Current BMI 39.2 kg/m. Discussed dietary restrictions weight loss and activity levels at length  DVT prophylaxis: Lovenox Code Status: Full Family Communication: None present  Status is: Inpatient  Dispo: The patient is from: Home              Anticipated d/c is to: Home              Anticipated d/c date is: 24 to 48 hours pending clinical course and resolution of symptoms              Patient currently not medically stable for discharge given ongoing requirements for IV fluids, IV narcotics and profound symptoms  Consultants:  GI  Procedures:  None planned  Antimicrobials:  None indicated  Subjective: No acute issues or events overnight, abdominal pain ongoing but markedly improved.  Otherwise denies nausea vomiting diarrhea constipation headache fevers chills or chest pain  Objective: Vitals:   05/08/22 0754 05/08/22 0800 05/08/22 0900 05/08/22 1000  BP:  106/74 137/74 (!) 130/59  Pulse:  95 95 99  Resp:  (!) 21 (!) 28 18  Temp: 99.9 F (37.7 C)     TempSrc: Oral     SpO2:  97% 96% 95%  Weight:      Height:        Intake/Output Summary (Last 24 hours) at 05/08/2022 1102 Last data filed at 05/08/2022 1100 Gross per 24 hour  Intake 6484.56 ml  Output 2200 ml  Net 4284.56 ml   Filed Weights   05/07/22 0206 05/07/22 0740  Weight: 127 kg 131.1 kg    Examination:  General:  Pleasantly resting in bed, No acute distress. HEENT:  Normocephalic atraumatic.  Sclerae nonicteric, noninjected.  Extraocular movements intact bilaterally. Neck:  Without mass or deformity.  Trachea is  midline. Lungs:  Clear to auscultate bilaterally without rhonchi, wheeze, or rales. Heart:  Regular rate and rhythm.  Without murmurs, rubs, or gallops. Abdomen: Diffusely tender, nondistended Extremities: Without cyanosis, clubbing, edema, or obvious deformity. Vascular:  Dorsalis pedis and posterior tibial pulses palpable bilaterally. Skin:  Warm and dry, no erythema  Data Reviewed: I have personally reviewed following labs and imaging studies  CBC: Recent Labs  Lab 05/06/22 2118 05/07/22 0802 05/08/22 0456  WBC 12.0* 12.0* 9.8  HGB 16.9 16.4 14.4  HCT 47.7 46.1 44.0  MCV 83.2 85.7 87.8  PLT 246 215 [id number]   Basic Metabolic Panel: Recent Labs  Lab 05/06/22 2118 05/07/22 0802 05/08/22 0456  NA 127* 133* 132*  K 3.5 3.5 4.1  CL 97* 105 103  CO2 15* 16* 17*  GLUCOSE 460* 212* 152*  BUN 12 12 9   CREATININE 1.08 1.92* 1.34*  CALCIUM 9.5 8.6* 7.8*  MG  --  2.3 2.2  PHOS  --  1.9* 2.5   GFR: Estimated Creatinine Clearance: 106.7 mL/min (A) (by C-G formula based on SCr of 1.34 mg/dL (H)). Liver Function Tests: Recent Labs  Lab 05/06/22 2118 05/07/22 0802 05/08/22 0456  AST 42* 46* 52*  ALT 41 34 27  ALKPHOS 182* 96 66  BILITOT 0.7 0.7 1.5*  PROT 8.5* 7.1 5.7*  ALBUMIN 4.8 3.9 3.1*   Recent Labs  Lab 05/06/22 2118 05/07/22 0802 05/08/22 0456  LIPASE 580* 385* 114*   No results for input(s): "AMMONIA" in the last 168 hours. Coagulation Profile: No results for input(s): "INR", "PROTIME" in the last 168 hours. Cardiac Enzymes: No results for input(s): "CKTOTAL", "CKMB", "CKMBINDEX", "TROPONINI" in the last 168 hours. BNP (last 3 results) No results for input(s): "PROBNP" in the last 8760 hours. HbA1C: No results for input(s): "HGBA1C" in the last 72 hours. CBG: Recent Labs  Lab 05/08/22 0609 05/08/22 0702 05/08/22 0751 05/08/22 0905 05/08/22 0955  GLUCAP 141* 124* 89 83 76   Lipid Profile: Recent Labs    05/06/22 2309 05/07/22 1202 05/07/22 1701  05/08/22 0456  CHOL >1,000*  --   --   --   HDL NOT REPORTED DUE TO HIGH TRIGLYCERIDES  --   --   --   LDLCALC UNABLE TO CALCULATE IF TRIGLYCERIDE OVER 400 mg/dL  --   --   --   TRIG >5,000*   < > >5,000* 1,886*  CHOLHDL NOT REPORTED DUE TO HIGH TRIGLYCERIDES  --   --   --   LDLDIRECT NOT CALCULATED  --   --   --    < > = values in this interval not displayed.   Thyroid Function Tests: No results for input(s): "TSH", "T4TOTAL", "FREET4", "T3FREE", "THYROIDAB" in the last 72 hours. Anemia Panel: No results for input(s): "VITAMINB12", "FOLATE", "FERRITIN", "TIBC", "IRON", "RETICCTPCT" in the last 72 hours. Sepsis Labs: No results for input(s): "PROCALCITON", "LATICACIDVEN" in the last 168 hours.  Recent Results (from the past 240 hour(s))  MRSA Next Gen by PCR, Nasal     Status: None   Collection Time: 05/07/22  7:46 AM   Specimen: Nasal Mucosa; Nasal Swab  Result Value Ref Range Status   MRSA by PCR Next Gen NOT DETECTED NOT DETECTED Final    Comment: (NOTE) The GeneXpert MRSA Assay (FDA approved for NASAL specimens  only), is one component of a comprehensive MRSA colonization surveillance program. It is not intended to diagnose MRSA infection nor to guide or monitor treatment for MRSA infections. Test performance is not FDA approved in patients less than 29 years old. Performed at [facility], [place] [address] [place], [place] [address]          Radiology Studies: CT Abdomen Pelvis W Contrast  Result Date: 05/07/2022 CLINICAL DATA:  Left lower quadrant abdominal pain EXAM: CT ABDOMEN AND PELVIS WITH CONTRAST TECHNIQUE: Multidetector CT imaging of the abdomen and pelvis was performed using the standard protocol following bolus administration of intravenous contrast. RADIATION DOSE REDUCTION: This exam was performed according to the departmental dose-optimization program which includes automated exposure control, adjustment of the mA and/or kV according to  patient size and/or use of iterative reconstruction technique. CONTRAST:  169mL OMNIPAQUE IOHEXOL 350 MG/ML SOLN COMPARISON:  08/25/2019 FINDINGS: Lower chest: No acute abnormality. Hepatobiliary: Mild hepatic steatosis. No enhancing intrahepatic mass. No intra or extrahepatic biliary ductal dilation. Gallbladder unremarkable. Pancreas: There is extensive peripancreatic inflammatory stranding surrounding the body and tail the pancreas in keeping with changes of acute pancreatitis. No loculated peripancreatic fluid collections or evidence of peripancreatic necrosis. There is, however, notable hypoenhancement of the pancreatic tail in keeping with early changes of pancreatic necrosis. The pancreatic duct is not dilated. Adjacent to the region of parenchymal hypoenhancement, the splenic vein is markedly narrowed but remains patent. Spleen: Unremarkable Adrenals/Urinary Tract: Adrenal glands are unremarkable. Kidneys are normal, without renal calculi, focal lesion, or hydronephrosis. Bladder is unremarkable. Stomach/Bowel: Stomach is within normal limits. Appendix appears normal. No evidence of bowel wall thickening, distention, or inflammatory changes. No free intraperitoneal gas or fluid. Vascular/Lymphatic: No significant vascular findings are present. No enlarged abdominal or pelvic lymph nodes. Reproductive: Prostate is unremarkable. Other: No abdominal wall hernia or abnormality. Musculoskeletal: Bilateral L5 pars defects are present without associated spondylolisthesis. Of the osseous structures are otherwise unremarkable. IMPRESSION: 1. 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 No loculated peripancreatic fluid collections or evidence of peripancreatic necrosis. Follow-up CT or MRI imaging in 3-5 days may be helpful to assess evolution of the area of threatened necrosis involving the tail of the pancreas. 2. Mild hepatic steatosis. 3. Bilateral L5 pars  defects without associated spondylolisthesis. Electronically Signed   By: Fidela Salisbury M.D.   On: 05/07/2022 02:12    Scheduled Meds:  atorvastatin  80 mg Oral q1800   Chlorhexidine Gluconate Cloth  6 each Topical Daily   enoxaparin (LOVENOX) injection  60 mg Subcutaneous Q24H   fenofibrate  160 mg Oral Daily   metoprolol succinate  50 mg Oral Daily   pantoprazole (PROTONIX) IV  40 mg Intravenous Q24H   Continuous Infusions:  dextrose 125 mL/hr at 05/08/22 1100   insulin 0.1 Units/kg/hr (05/08/22 1100)   lactated ringers 100 mL/hr at 05/08/22 1100   meropenem (MERREM) IV Stopped (05/08/22 [id number])     LOS: 1 day   Time spent: 44min  Domanique Huesman C Pearson Reasons, DO [facility]  If 7PM-7AM, please contact night-coverage [email or web address]  05/08/2022, 11:02 AM

## 2022-05-09 LAB — GLUCOSE, CAPILLARY
Glucose-Capillary: 107 mg/dL — ABNORMAL HIGH (ref 70–99)
Glucose-Capillary: 109 mg/dL — ABNORMAL HIGH (ref 70–99)
Glucose-Capillary: 116 mg/dL — ABNORMAL HIGH (ref 70–99)
Glucose-Capillary: 118 mg/dL — ABNORMAL HIGH (ref 70–99)
Glucose-Capillary: 132 mg/dL — ABNORMAL HIGH (ref 70–99)
Glucose-Capillary: 153 mg/dL — ABNORMAL HIGH (ref 70–99)
Glucose-Capillary: 159 mg/dL — ABNORMAL HIGH (ref 70–99)
Glucose-Capillary: 161 mg/dL — ABNORMAL HIGH (ref 70–99)
Glucose-Capillary: 163 mg/dL — ABNORMAL HIGH (ref 70–99)
Glucose-Capillary: 164 mg/dL — ABNORMAL HIGH (ref 70–99)
Glucose-Capillary: 183 mg/dL — ABNORMAL HIGH (ref 70–99)
Glucose-Capillary: 196 mg/dL — ABNORMAL HIGH (ref 70–99)
Glucose-Capillary: 200 mg/dL — ABNORMAL HIGH (ref 70–99)
Glucose-Capillary: 57 mg/dL — ABNORMAL LOW (ref 70–99)
Glucose-Capillary: 62 mg/dL — ABNORMAL LOW (ref 70–99)
Glucose-Capillary: 62 mg/dL — ABNORMAL LOW (ref 70–99)
Glucose-Capillary: 81 mg/dL (ref 70–99)
Glucose-Capillary: 83 mg/dL (ref 70–99)
Glucose-Capillary: 85 mg/dL (ref 70–99)
Glucose-Capillary: 88 mg/dL (ref 70–99)
Glucose-Capillary: 94 mg/dL (ref 70–99)

## 2022-05-09 LAB — BASIC METABOLIC PANEL
Anion gap: 8 (ref 5–15)
Anion gap: 14 (ref 5–15)
BUN: <5 mg/dL — ABNORMAL LOW (ref 6–20)
BUN: <5 mg/dL — ABNORMAL LOW (ref 6–20)
CO2: 23 mmol/L (ref 22–32)
CO2: 23 mmol/L (ref 22–32)
Calcium: 8.1 mg/dL — ABNORMAL LOW (ref 8.9–10.3)
Calcium: 8.6 mg/dL — ABNORMAL LOW (ref 8.9–10.3)
Chloride: 98 mmol/L (ref 98–111)
Chloride: 96 mmol/L — ABNORMAL LOW (ref 98–111)
Creatinine, Ser: 0.9 mg/dL (ref 0.61–1.24)
Creatinine, Ser: 0.94 mg/dL (ref 0.61–1.24)
GFR, Estimated: >60 mL/min (ref >60)
GFR, Estimated: >60 mL/min (ref >60)
Glucose, Bld: 112 mg/dL — ABNORMAL HIGH (ref 70–99)
Glucose, Bld: 134 mg/dL — ABNORMAL HIGH (ref 70–99)
Potassium: 2.7 mmol/L — CL (ref 3.5–5.1)
Potassium: 3.7 mmol/L (ref 3.5–5.1)
Sodium: 129 mmol/L — ABNORMAL LOW (ref 135–145)
Sodium: 133 mmol/L — ABNORMAL LOW (ref 135–145)

## 2022-05-09 LAB — LIPASE, BLOOD: Lipase: 73 U/L — ABNORMAL HIGH (ref 11–51)

## 2022-05-09 LAB — TRIGLYCERIDES
Triglycerides: 1814 mg/dL — ABNORMAL HIGH (ref <150)
Triglycerides: 1794 mg/dL — ABNORMAL HIGH (ref <150)

## 2022-05-09 MED ORDER — DEXTROSE 50 % IV SOLN
INTRAVENOUS | Status: AC
  Filled 2022-05-09: qty 50

## 2022-05-09 MED ORDER — POTASSIUM CHLORIDE 10 MEQ/100ML IV SOLN
10.0000 meq | INTRAVENOUS | Status: DC
  Administered 2022-05-09 (×2): 10 meq via INTRAVENOUS
  Filled 2022-05-09 (×4): qty 100

## 2022-05-09 MED ORDER — DEXTROSE 50 % IV SOLN
25.0000 mL | Freq: Once | INTRAVENOUS | Status: AC
  Administered 2022-05-09: 25 mL via INTRAVENOUS

## 2022-05-09 MED ORDER — DEXTROSE 50 % IV SOLN
25.0000 mL | Freq: Once | INTRAVENOUS | Status: AC
  Administered 2022-05-09: 25 mL via INTRAVENOUS
  Filled 2022-05-09: qty 50

## 2022-05-09 MED ORDER — POTASSIUM CHLORIDE CRYS ER 20 MEQ PO TBCR
40.0000 meq | EXTENDED_RELEASE_TABLET | Freq: Once | ORAL | Status: AC
  Administered 2022-05-09: 40 meq via ORAL
  Filled 2022-05-09: qty 2

## 2022-05-09 MED ORDER — POTASSIUM CHLORIDE CRYS ER 20 MEQ PO TBCR
40.0000 meq | EXTENDED_RELEASE_TABLET | ORAL | Status: AC
  Administered 2022-05-09: 40 meq via ORAL
  Filled 2022-05-09: qty 2

## 2022-05-09 NOTE — Progress Notes (Signed)
Subjective: Abdominal pain improving. Appetite improving. Tolerating clear liquid diet.  Objective: Vital signs in last 24 hours: Temp:  [98.2 F (36.8 C)-102.7 F (39.3 C)] 99 F (37.2 C) (03/23 0900) Pulse Rate:  [87-99] 95 (03/23 0700) Resp:  [9-32] 20 (03/23 0700) BP: (105-177)/(50-116) 137/66 (03/23 0700) SpO2:  [92 %-100 %] 97 % (03/23 0700) Weight change:  Last BM Date : 05/06/22  PE: GEN:  NAD, overweight ABD:  Protuberant, soft, mild generalized tenderness without peritonitis  Lab Results: CBC    Component Value Date/Time   WBC 9.8 05/08/2022 0456   RBC 5.01 05/08/2022 0456   HGB 14.4 05/08/2022 0456   HCT 44.0 05/08/2022 0456   PLT 228 05/08/2022 0456   MCV 87.8 05/08/2022 0456   MCH 28.7 05/08/2022 0456   MCHC 32.7 05/08/2022 0456   RDW 13.8 05/08/2022 0456   LYMPHSABS 2.5 08/24/2020 1754   MONOABS 0.5 08/24/2020 1754   EOSABS 0.2 08/24/2020 1754   BASOSABS 0.1 08/24/2020 1754  CMP     Component Value Date/Time   NA 129 (L) 05/09/2022 0858   K 2.7 (LL) 05/09/2022 0858   CL 98 05/09/2022 0858   CO2 23 05/09/2022 0858   GLUCOSE 112 (H) 05/09/2022 0858   BUN <5 (L) 05/09/2022 0858   CREATININE 0.90 05/09/2022 0858   CALCIUM 8.1 (L) 05/09/2022 0858   PROT 5.7 (L) 05/08/2022 0456   ALBUMIN 3.1 (L) 05/08/2022 0456   AST 52 (H) 05/08/2022 0456   ALT 27 05/08/2022 0456   ALKPHOS 66 05/08/2022 0456   BILITOT 1.5 (H) 05/08/2022 0456   GFRNONAA >60 05/09/2022 0858   GFRAA >60 09/01/2019 0400   Assessment:   Pancreatitis due to hypertriglyceridemia. Fevers.  Suspect due to pancreatitis, do not suspect abscess.  Plan:   Clear liquid diet; with current fevers, I would not be quick to advance diet. Continue meropenem. Follow clinically; if fevers continue for the next 24 hours, consider expedited follow-up CT scan, otherwise repeat CT scan early/mid next week. GI will follow.   Landry Dyke 05/09/2022, 11:28 AM   Cell [phone] If no  answer or after 5 PM call [phone]

## 2022-05-09 NOTE — Progress Notes (Addendum)
PROGRESS NOTE    [name].  BJ:[id number] DOB: 09-12-1985 DOA: 05/06/2022 PCP: Tomasa Hose, NP   Brief Narrative:  Douglas [name]. is a 37 y.o. male with medical history significant of type 2 diabetes, class II obesity, hyperlipidemia/hypertriglyceridemia, hypertension, right shoulder arthropathy, rectal bleeding, recurrent episodes of pancreatitis due to hypertriglyceridemia with peripancreatic fluid collection who presented to the emergency department complaints of abdominal pain after he ate a particularly fat/grease-heavy meal. CT abdomen/pelvis with contrast showing acute pancreatitis with changes of incipient pancreatic necrosis involving the pancreatic tail. Given elevated lipase patient was admitted to hospitalist service.  Assessment & Plan:   Principal Problem:   Acute pancreatitis Active Problems:   Hypertension   Hypertriglyceridemia   Type 2 diabetes mellitus without complication (HCC)   Class II obesity   AKI (acute kidney injury) ([place])  Acute recurrent pancreatitis with questionable necrosis on imaging Intractable abdominal pain -Patient has known hypertriglyceridemia with previous episodes of pancreatitis due to elevated triglyceride levels -Patient had a fatty "cheat meal" which precipitated his current exacerbation -Lipase is downtrending appropriately -Triglycerides somewhat labile, initially downtrending but elevated yesterday late. -Continue IV fluids, IV insulin per protocol -Continue to advance diet - full liquid diet, mild hypoglycemia overnight will hopefully improve with p.o. intake -GI following, appreciate insight and recommendations -Repeat CT in the next 72 to 96 hours -Wean off IV Dilaudid, initiate p.o. oxycodone, titrate appropriately and aggressively   AKI (acute kidney injury) (HCC) Hypokalemia Continue IV fluids. Continue to replete as appropriate, follow repeat labs  Essential hypertension Likely exacerbated  given above pain  Continue metoprolol 50 mg p.o. daily. Monitor blood pressure and heart rate.   Hypertriglyceridemia (Familial?) Continue atorvastatin 80 mg p.o. daily. Continue fenofibrate 160 mg p.o. daily. Continue insulin infusion. Triglycerides >5000 at intake -downtrending appropriately, repeat 1886   Type 2 diabetes mellitus without complication ([place]) 123XX123 undetectably high 2021 >15.1 Current repeat A1c pending Transition to clear liquid diet, continue insulin drip, monitor for hypoglycemic events   Class II obesity Current BMI 39.2 kg/m. Discussed dietary restrictions weight loss and activity levels at length  DVT prophylaxis: Lovenox Code Status: Full Family Communication: None present  Status is: Inpatient  Dispo: The patient is from: Home              Anticipated d/c is to: Home              Anticipated d/c date is: 24 to 48 hours pending clinical course and resolution of symptoms              Patient currently not medically stable for discharge given ongoing requirements for IV fluids, IV narcotics and profound symptoms  Consultants:  GI  Procedures:  None planned  Antimicrobials:  None indicated  Subjective: No acute issues or events overnight, abdominal pain ongoing but markedly improved.  Otherwise denies nausea vomiting diarrhea constipation headache fevers chills or chest pain. Requesting advancement of diet but we discussed the need to do so cautiously given his labs.   Objective: Vitals:   05/09/22 0300 05/09/22 0400 05/09/22 0500 05/09/22 0600  BP: 125/71 (!) 153/88 (!) 146/81 (!) 141/70  Pulse: 88 91 99 99  Resp: (!) 29 (!) 28 (!) 32 (!) 21  Temp:      TempSrc:      SpO2: 95% 95% 98% 95%  Weight:      Height:        Intake/Output Summary (Last 24 hours) at 05/09/2022 [phone]  Last data filed at 05/09/2022 0515 Gross per 24 hour  Intake 2182.49 ml  Output 4250 ml  Net -2067.51 ml    Filed Weights   05/07/22 0206 05/07/22 0740  Weight:  127 kg 131.1 kg    Examination:  General:  Pleasantly resting in bed, No acute distress. HEENT:  Normocephalic atraumatic.  Sclerae nonicteric, noninjected.  Extraocular movements intact bilaterally. Neck:  Without mass or deformity.  Trachea is midline. Lungs:  Clear to auscultate bilaterally without rhonchi, wheeze, or rales. Heart:  Regular rate and rhythm.  Without murmurs, rubs, or gallops. Abdomen: Diffusely tender, nondistended Extremities: Without cyanosis, clubbing, edema, or obvious deformity. Vascular:  Dorsalis pedis and posterior tibial pulses palpable bilaterally. Skin:  Warm and dry, no erythema  Data Reviewed: I have personally reviewed following labs and imaging studies  CBC: Recent Labs  Lab 05/06/22 2118 05/07/22 0802 05/08/22 0456  WBC 12.0* 12.0* 9.8  HGB 16.9 16.4 14.4  HCT 47.7 46.1 44.0  MCV 83.2 85.7 87.8  PLT 246 215 [id number]    Basic Metabolic Panel: Recent Labs  Lab 05/06/22 2118 05/07/22 0802 05/08/22 0456  NA 127* 133* 132*  K 3.5 3.5 4.1  CL 97* 105 103  CO2 15* 16* 17*  GLUCOSE 460* 212* 152*  BUN 12 12 9   CREATININE 1.08 1.92* 1.34*  CALCIUM 9.5 8.6* 7.8*  MG  --  2.3 2.2  PHOS  --  1.9* 2.5    GFR: Estimated Creatinine Clearance: 106.7 mL/min (A) (by C-G formula based on SCr of 1.34 mg/dL (H)). Liver Function Tests: Recent Labs  Lab 05/06/22 2118 05/07/22 0802 05/08/22 0456  AST 42* 46* 52*  ALT 41 34 27  ALKPHOS 182* 96 66  BILITOT 0.7 0.7 1.5*  PROT 8.5* 7.1 5.7*  ALBUMIN 4.8 3.9 3.1*    Recent Labs  Lab 05/06/22 2118 05/07/22 0802 05/08/22 0456  LIPASE 580* 385* 114*   CBG: Recent Labs  Lab 05/09/22 0234 05/09/22 0357 05/09/22 0424 05/09/22 0511 05/09/22 0610  GLUCAP 83 62* 81 200* 196*    Lipid Profile: Recent Labs    05/06/22 2309 05/07/22 1202 05/08/22 0456 05/08/22 1644  CHOL >1,000*  --   --   --   HDL NOT REPORTED DUE TO HIGH TRIGLYCERIDES  --   --   --   LDLCALC UNABLE TO CALCULATE IF  TRIGLYCERIDE OVER 400 mg/dL  --   --   --   TRIG >5,000*   < > 1,886* 2,034*  CHOLHDL NOT REPORTED DUE TO HIGH TRIGLYCERIDES  --   --   --   LDLDIRECT NOT CALCULATED  --   --   --    < > = values in this interval not displayed.    Recent Results (from the past 240 hour(s))  MRSA Next Gen by PCR, Nasal     Status: None   Collection Time: 05/07/22  7:46 AM   Specimen: Nasal Mucosa; Nasal Swab  Result Value Ref Range Status   MRSA by PCR Next Gen NOT DETECTED NOT DETECTED Final    Comment: (NOTE) The GeneXpert MRSA Assay (FDA approved for NASAL specimens only), is one component of a comprehensive MRSA colonization surveillance program. It is not intended to diagnose MRSA infection nor to guide or monitor treatment for MRSA infections. Test performance is not FDA approved in patients less than 55 years old. Performed at [facility], [place] [address] [place], [place] [address]     Radiology Studies:  No results found.  Scheduled Meds:  atorvastatin  80 mg Oral q1800   Chlorhexidine Gluconate Cloth  6 each Topical Daily   enoxaparin (LOVENOX) injection  60 mg Subcutaneous Q24H   fenofibrate  160 mg Oral Daily   metoprolol succinate  50 mg Oral Daily   pantoprazole (PROTONIX) IV  40 mg Intravenous Q24H   Continuous Infusions:  dextrose 125 mL/hr at 05/08/22 2338   insulin 0.1 Units/kg/hr (05/08/22 2122)   lactated ringers 100 mL/hr at 05/08/22 1636   meropenem (MERREM) IV Stopped (05/09/22 0600)    LOS: 2 days   Time spent: 78min  Geniya Fulgham C Kurstyn Larios, DO [facility]  If 7PM-7AM, please contact night-coverage [email or web address]  05/09/2022, 6:59 AM

## 2022-05-09 NOTE — Progress Notes (Signed)
Patient CBG is 57. Patient is alert and oriented x4 states he feels fine. Gave half amp of D50. Will recheck in 30 minuets.

## 2022-05-09 NOTE — Progress Notes (Signed)
Hypoglycemic Event  CBG: 62  Treatment: 4 oz juice/soda  Symptoms: None  Follow-up CBG: Time:15 minutes after giving 4 oz of juice  CBG Result:81  Possible Reasons for Event: Inadequate meal intake  Comments/MD notified: A. Zebedee Iba, NP notified of this event. New orders to administer 1/2 of D50 amp.      Tahmir Kleckner Wynonia Musty

## 2022-05-10 ENCOUNTER — Inpatient Hospital Stay (HOSPITAL_COMMUNITY): Payer: Medicaid Other

## 2022-05-10 LAB — GLUCOSE, CAPILLARY
Glucose-Capillary: 100 mg/dL — ABNORMAL HIGH (ref 70–99)
Glucose-Capillary: 101 mg/dL — ABNORMAL HIGH (ref 70–99)
Glucose-Capillary: 102 mg/dL — ABNORMAL HIGH (ref 70–99)
Glucose-Capillary: 107 mg/dL — ABNORMAL HIGH (ref 70–99)
Glucose-Capillary: 107 mg/dL — ABNORMAL HIGH (ref 70–99)
Glucose-Capillary: 109 mg/dL — ABNORMAL HIGH (ref 70–99)
Glucose-Capillary: 124 mg/dL — ABNORMAL HIGH (ref 70–99)
Glucose-Capillary: 134 mg/dL — ABNORMAL HIGH (ref 70–99)
Glucose-Capillary: 152 mg/dL — ABNORMAL HIGH (ref 70–99)
Glucose-Capillary: 160 mg/dL — ABNORMAL HIGH (ref 70–99)
Glucose-Capillary: 162 mg/dL — ABNORMAL HIGH (ref 70–99)
Glucose-Capillary: 167 mg/dL — ABNORMAL HIGH (ref 70–99)
Glucose-Capillary: 173 mg/dL — ABNORMAL HIGH (ref 70–99)
Glucose-Capillary: 186 mg/dL — ABNORMAL HIGH (ref 70–99)
Glucose-Capillary: 61 mg/dL — ABNORMAL LOW (ref 70–99)
Glucose-Capillary: 65 mg/dL — ABNORMAL LOW (ref 70–99)
Glucose-Capillary: 66 mg/dL — ABNORMAL LOW (ref 70–99)
Glucose-Capillary: 66 mg/dL — ABNORMAL LOW (ref 70–99)
Glucose-Capillary: 69 mg/dL — ABNORMAL LOW (ref 70–99)
Glucose-Capillary: 69 mg/dL — ABNORMAL LOW (ref 70–99)
Glucose-Capillary: 72 mg/dL (ref 70–99)
Glucose-Capillary: 73 mg/dL (ref 70–99)
Glucose-Capillary: 74 mg/dL (ref 70–99)
Glucose-Capillary: 77 mg/dL (ref 70–99)
Glucose-Capillary: 79 mg/dL (ref 70–99)
Glucose-Capillary: 83 mg/dL (ref 70–99)
Glucose-Capillary: 90 mg/dL (ref 70–99)
Glucose-Capillary: 91 mg/dL (ref 70–99)
Glucose-Capillary: 93 mg/dL (ref 70–99)
Glucose-Capillary: 98 mg/dL (ref 70–99)

## 2022-05-10 LAB — BASIC METABOLIC PANEL
Anion gap: 10 (ref 5–15)
BUN: <5 mg/dL — ABNORMAL LOW (ref 6–20)
CO2: 21 mmol/L — ABNORMAL LOW (ref 22–32)
Calcium: 8.5 mg/dL — ABNORMAL LOW (ref 8.9–10.3)
Chloride: 99 mmol/L (ref 98–111)
Creatinine, Ser: 0.97 mg/dL (ref 0.61–1.24)
GFR, Estimated: >60 mL/min (ref >60)
Glucose, Bld: 147 mg/dL — ABNORMAL HIGH (ref 70–99)
Potassium: 3.6 mmol/L (ref 3.5–5.1)
Sodium: 130 mmol/L — ABNORMAL LOW (ref 135–145)

## 2022-05-10 LAB — TRIGLYCERIDES
Triglycerides: 1840 mg/dL — ABNORMAL HIGH (ref <150)
Triglycerides: 971 mg/dL — ABNORMAL HIGH (ref <150)

## 2022-05-10 LAB — LIPASE, BLOOD: Lipase: 68 U/L — ABNORMAL HIGH (ref 11–51)

## 2022-05-10 MED ORDER — HYDROMORPHONE HCL 1 MG/ML IJ SOLN
1.0000 mg | INTRAMUSCULAR | Status: DC | PRN
  Administered 2022-05-10 – 2022-05-12 (×12): 1 mg via INTRAVENOUS
  Filled 2022-05-10 (×12): qty 1

## 2022-05-10 MED ORDER — DEXTROSE 10 % IV SOLN: INTRAVENOUS | Status: DC

## 2022-05-10 MED ORDER — INSULIN (MYXREDLIN) INFUSION FOR HYPERTRIGLYCERIDEMIA
0.1000 [IU]/kg/h | INTRAVENOUS | Status: DC
  Administered 2022-05-10 (×2): 0.1 [IU]/kg/h via INTRAVENOUS
  Filled 2022-05-10 (×2): qty 100

## 2022-05-10 MED ORDER — IOHEXOL 350 MG/ML SOLN
100.0000 mL | Freq: Once | INTRAVENOUS | Status: AC | PRN
  Administered 2022-05-10: 100 mL via INTRAVENOUS

## 2022-05-10 MED ORDER — DEXTROSE 50 % IV SOLN
12.5000 g | INTRAVENOUS | Status: AC
  Administered 2022-05-10: 12.5 g via INTRAVENOUS

## 2022-05-10 MED ORDER — DEXTROSE 50 % IV SOLN
12.5000 g | INTRAVENOUS | Status: AC
  Administered 2022-05-10: 12.5 g via INTRAVENOUS
  Filled 2022-05-10: qty 100

## 2022-05-10 MED ORDER — DEXTROSE 50 % IV SOLN
12.5000 g | Freq: Once | INTRAVENOUS | Status: AC
  Administered 2022-05-10: 12.5 g via INTRAVENOUS
  Filled 2022-05-10: qty 50

## 2022-05-10 MED ORDER — DEXTROSE 50 % IV SOLN
12.5000 g | Freq: Once | INTRAVENOUS | Status: AC
  Administered 2022-05-10: 12.5 g via INTRAVENOUS

## 2022-05-10 MED ORDER — DEXTROSE 50 % IV SOLN
INTRAVENOUS | Status: AC
  Filled 2022-05-10: qty 50

## 2022-05-10 MED ORDER — INSULIN (MYXREDLIN) INFUSION FOR HYPERTRIGLYCERIDEMIA
0.2000 [IU]/kg/h | INTRAVENOUS | Status: DC
  Administered 2022-05-10: 0.2 [IU]/kg/h via INTRAVENOUS
  Filled 2022-05-10: qty 100

## 2022-05-10 NOTE — Progress Notes (Signed)
Subjective: Worsening abdominal pain. Ongoing fevers.  Objective: Vital signs in last 24 hours: Temp:  [97.7 F (36.5 C)-101.6 F (38.7 C)] 100 F (37.8 C) (03/24 0834) Pulse Rate:  [81-99] 81 (03/24 1200) Resp:  [16-31] 27 (03/24 1200) BP: (126-146)/(70-78) 129/70 (03/24 0941) SpO2:  [96 %-100 %] 100 % (03/24 1200) Weight change:  Last BM Date : 05/06/22  PE: GEN:  Overweight, more uncomfortable-appearing cf yesterday ABD:  Protuberant, mild distended, soft, mild generalized tenderness without peritonitis  Lab Results: CBC    Component Value Date/Time   WBC 9.8 05/08/2022 0456   RBC 5.01 05/08/2022 0456   HGB 14.4 05/08/2022 0456   HCT 44.0 05/08/2022 0456   PLT 228 05/08/2022 0456   MCV 87.8 05/08/2022 0456   MCH 28.7 05/08/2022 0456   MCHC 32.7 05/08/2022 0456   RDW 13.8 05/08/2022 0456   LYMPHSABS 2.5 08/24/2020 1754   MONOABS 0.5 08/24/2020 1754   EOSABS 0.2 08/24/2020 1754   BASOSABS 0.1 08/24/2020 1754  CMP     Component Value Date/Time   NA 130 (L) 05/10/2022 0313   K 3.6 05/10/2022 0313   CL 99 05/10/2022 0313   CO2 21 (L) 05/10/2022 0313   GLUCOSE 147 (H) 05/10/2022 0313   BUN <5 (L) 05/10/2022 0313   CREATININE 0.97 05/10/2022 0313   CALCIUM 8.5 (L) 05/10/2022 0313   PROT 5.7 (L) 05/08/2022 0456   ALBUMIN 3.1 (L) 05/08/2022 0456   AST 52 (H) 05/08/2022 0456   ALT 27 05/08/2022 0456   ALKPHOS 66 05/08/2022 0456   BILITOT 1.5 (H) 05/08/2022 0456   GFRNONAA >60 05/10/2022 0313   GFRAA >60 09/01/2019 0400   Assessment:   Pancreatitis due to hypertriglyceridemia. Fevers.  Suspect due to pancreatitis, do not suspect abscess.  Plan:   NPO. Continue antibiotics, IVF, supportive care for pancreatitis. Triglycerides still > 1000, any other treatment options? (Might curbside endocrinology to ask tomorrow). Repeat CT scan. Case reviewed with Dr. Avon Gully. Dr. Benson Norway to see patient tomorrow.   Landry Dyke 05/10/2022, 12:35 PM   Cell  [phone] If no answer or after 5 PM call [phone]

## 2022-05-10 NOTE — Progress Notes (Signed)
PROGRESS NOTE    Douglas [name].  BJ:[id number] DOB: Aug 18, 1985 DOA: 05/06/2022 PCP: Tomasa Hose, NP   Brief Narrative:  Douglas Gunzenhauser. is a 37 y.o. male with medical history significant of type 2 diabetes, class II obesity, hyperlipidemia/hypertriglyceridemia, hypertension, right shoulder arthropathy, rectal bleeding, recurrent episodes of pancreatitis due to hypertriglyceridemia with peripancreatic fluid collection who presented to the emergency department complaints of abdominal pain after he ate a particularly fat/grease-heavy meal. CT abdomen/pelvis with contrast showing acute pancreatitis with changes of incipient pancreatic necrosis involving the pancreatic tail. Given elevated lipase patient was admitted to hospitalist service.  Hospital course: Patient initially improving over the first 2 days of his hospitalization with downtrending labs improving symptoms and increased p.o. tolerance.  Unfortunately by day 3 patient's labs continue to be somewhat flat and symptoms are somewhat worse today with low-grade fevers overnight and worsening abdominal pain.  Increasing patient's insulin and D10 IV fluids in hopes to further combat patient's ongoing elevated triglycerides.  Patient may warrant repeat CT in the next 24 hours.  Assessment & Plan:   Principal Problem:   Acute pancreatitis Active Problems:   Hypertension   Hypertriglyceridemia   Type 2 diabetes mellitus without complication (HCC)   Class II obesity   AKI (acute kidney injury) ([place])  Acute recurrent pancreatitis with questionable necrosis on imaging Intractable abdominal pain -Patient has known hypertriglyceridemia with previous episodes of pancreatitis due to elevated triglyceride levels -Lipase is downtrending appropriately -Triglycerides unfortunately have plateaued around 1800 -Increasing IV insulin 0.2 unit/kg/mL(previously on 0.1u) in hopes to combat patient's ongoing  hypertriglyceridemia. -Patient having episodes of hypoglycemia, increase fluids to D10 at 125, may still require D50 given his p.o. intake has decreased over the past 24 hours -Continue full liquid diet -patient appetite quite poor today -GI following, appreciate insight and recommendations -Repeat CT in the next 48-72 hours -Wean off IV Dilaudid to p.o. oxycodone as able   AKI (acute kidney injury) ([place]) resolved Hypokalemia Continue IV fluids. Continue to replete K as appropriate, continues to be labile in the setting of D10/insulin gtt  Essential hypertension Likely exacerbated given above pain  Well controlled on metoprolol 50 mg p.o. daily.   Hypertriglyceridemia (Not familial per discussion) Continue atorvastatin 80 mg p.o. daily. Continue fenofibrate 160 mg p.o. daily. Continue insulin infusion as above Triglycerides >5000 at intake -having plateaued at 1800 (per distant chart review baseline previously 2 years ago appears to be around 700 -unclear if this was before aggressive treatment with statin/fibrate)   Type 2 diabetes mellitus without complication ([place]) 123XX123 undetectably high 2021 (>15) Repeat A1C pending Continue to advance diet as tolerated Continue D10/Insulin gtt as above   Class II obesity Body mass index is 39.2 kg/m. Discussed dietary restrictions weight loss and activity levels at length  DVT prophylaxis: Lovenox Code Status: Full Family Communication: None present  Status is: Inpatient  Dispo: The patient is from: Home              Anticipated d/c is to: Home              Anticipated d/c date is: 24 to 48 hours pending clinical course and resolution of symptoms              Patient currently not medically stable for discharge given ongoing requirements for IV fluids, IV narcotics and profound symptoms  Consultants:  GI  Procedures:  None planned  Antimicrobials:  None indicated  Subjective: Low-grade fevers and worsening  abdominal pain and  decreased appetite over the past 12 hours.  Denies headache fever chills nausea vomiting constipation or diarrhea.  Objective: Vitals:   05/09/22 1331 05/09/22 1550 05/09/22 2000 05/10/22 0000  BP:   (!) 146/78   Pulse:  89 84   Resp:  18 (!) 23   Temp: 97.7 F (36.5 C) 99.5 F (37.5 C) 99.2 F (37.3 C) 98.2 F (36.8 C)  TempSrc: Oral Oral Oral   SpO2:  98% 96%   Weight:      Height:        Intake/Output Summary (Last 24 hours) at 05/10/2022 0704 Last data filed at 05/10/2022 0000 Gross per 24 hour  Intake 7112.38 ml  Output 2375 ml  Net 4737.38 ml    Filed Weights   05/07/22 0206 05/07/22 0740  Weight: 127 kg 131.1 kg    Examination:  General:  Pleasantly resting in bed, No acute distress. HEENT:  Normocephalic atraumatic.  Sclerae nonicteric, noninjected.  Extraocular movements intact bilaterally. Neck:  Without mass or deformity.  Trachea is midline. Lungs:  Clear to auscultate bilaterally without rhonchi, wheeze, or rales. Heart:  Regular rate and rhythm.  Without murmurs, rubs, or gallops. Abdomen: Diffusely tender, nondistended Extremities: Without cyanosis, clubbing, edema, or obvious deformity. Vascular:  Dorsalis pedis and posterior tibial pulses palpable bilaterally. Skin:  Warm and dry, no erythema  Data Reviewed: I have personally reviewed following labs and imaging studies  CBC: Recent Labs  Lab 05/06/22 2118 05/07/22 0802 05/08/22 0456  WBC 12.0* 12.0* 9.8  HGB 16.9 16.4 14.4  HCT 47.7 46.1 44.0  MCV 83.2 85.7 87.8  PLT 246 215 [id number]    Basic Metabolic Panel: Recent Labs  Lab 05/06/22 2118 05/07/22 0802 05/08/22 0456 05/09/22 0858 05/09/22 1840  NA 127* 133* 132* 129* 133*  K 3.5 3.5 4.1 2.7* 3.7  CL 97* 105 103 98 96*  CO2 15* 16* 17* 23 23  GLUCOSE 460* 212* 152* 112* 134*  BUN 12 12 9  <5* <5*  CREATININE 1.08 1.92* 1.34* 0.90 0.94  CALCIUM 9.5 8.6* 7.8* 8.1* 8.6*  MG  --  2.3 2.2  --   --   PHOS  --  1.9* 2.5  --   --      GFR: Estimated Creatinine Clearance: 152.1 mL/min (by C-G formula based on SCr of 0.94 mg/dL). Liver Function Tests: Recent Labs  Lab 05/06/22 2118 05/07/22 0802 05/08/22 0456  AST 42* 46* 52*  ALT 41 34 27  ALKPHOS 182* 96 66  BILITOT 0.7 0.7 1.5*  PROT 8.5* 7.1 5.7*  ALBUMIN 4.8 3.9 3.1*    Recent Labs  Lab 05/06/22 2118 05/07/22 0802 05/08/22 0456 05/09/22 0858  LIPASE 580* 385* 114* 73*   CBG: Recent Labs  Lab 05/10/22 0226 05/10/22 0328 05/10/22 0419 05/10/22 0550 05/10/22 0650  GLUCAP 173* 134* 107* 66* 160*    Lipid Profile: Recent Labs    05/09/22 1653 05/10/22 0313  TRIG 1,794* 1,840*    Recent Results (from the past 240 hour(s))  MRSA Next Gen by PCR, Nasal     Status: None   Collection Time: 05/07/22  7:46 AM   Specimen: Nasal Mucosa; Nasal Swab  Result Value Ref Range Status   MRSA by PCR Next Gen NOT DETECTED NOT DETECTED Final    Comment: (NOTE) The GeneXpert MRSA Assay (FDA approved for NASAL specimens only), is one component of a comprehensive MRSA colonization surveillance program. It is not intended to diagnose MRSA infection  nor to guide or monitor treatment for MRSA infections. Test performance is not FDA approved in patients less than 15 years old. Performed at [facility], [place] [address], [place], [place] [address]     Radiology Studies: No results found.  Scheduled Meds:  atorvastatin  80 mg Oral q1800   Chlorhexidine Gluconate Cloth  6 each Topical Daily   enoxaparin (LOVENOX) injection  60 mg Subcutaneous Q24H   fenofibrate  160 mg Oral Daily   metoprolol succinate  50 mg Oral Daily   pantoprazole (PROTONIX) IV  40 mg Intravenous Q24H   Continuous Infusions:  dextrose 125 mL/hr at 05/10/22 0000   insulin     lactated ringers Stopped (05/09/22 1635)   meropenem (MERREM) IV 1 g (05/10/22 0603)    LOS: 3 days   Time spent: 55min  Douglas Martine C Errika Narvaiz, DO [facility]  If 7PM-7AM,  please contact night-coverage [email or web address]  05/10/2022, 7:04 AM

## 2022-05-11 LAB — GLUCOSE, CAPILLARY
Glucose-Capillary: 66 mg/dL — ABNORMAL LOW (ref 70–99)
Glucose-Capillary: 125 mg/dL — ABNORMAL HIGH (ref 70–99)
Glucose-Capillary: 113 mg/dL — ABNORMAL HIGH (ref 70–99)
Glucose-Capillary: 102 mg/dL — ABNORMAL HIGH (ref 70–99)
Glucose-Capillary: 92 mg/dL (ref 70–99)
Glucose-Capillary: 92 mg/dL (ref 70–99)
Glucose-Capillary: 74 mg/dL (ref 70–99)
Glucose-Capillary: 74 mg/dL (ref 70–99)
Glucose-Capillary: 67 mg/dL — ABNORMAL LOW (ref 70–99)
Glucose-Capillary: 80 mg/dL (ref 70–99)
Glucose-Capillary: 153 mg/dL — ABNORMAL HIGH (ref 70–99)
Glucose-Capillary: 132 mg/dL — ABNORMAL HIGH (ref 70–99)
Glucose-Capillary: 148 mg/dL — ABNORMAL HIGH (ref 70–99)
Glucose-Capillary: 170 mg/dL — ABNORMAL HIGH (ref 70–99)
Glucose-Capillary: 148 mg/dL — ABNORMAL HIGH (ref 70–99)
Glucose-Capillary: 144 mg/dL — ABNORMAL HIGH (ref 70–99)
Glucose-Capillary: 154 mg/dL — ABNORMAL HIGH (ref 70–99)
Glucose-Capillary: 166 mg/dL — ABNORMAL HIGH (ref 70–99)

## 2022-05-11 LAB — HEMOGLOBIN A1C
Hgb A1c MFr Bld: 14.4 % — ABNORMAL HIGH (ref 4.8–5.6)
Mean Plasma Glucose: 367 mg/dL

## 2022-05-11 LAB — TRIGLYCERIDES
Triglycerides: 726 mg/dL — ABNORMAL HIGH (ref <150)
Triglycerides: 591 mg/dL — ABNORMAL HIGH (ref <150)

## 2022-05-11 LAB — BASIC METABOLIC PANEL
Anion gap: 16 — ABNORMAL HIGH (ref 5–15)
BUN: <5 mg/dL — ABNORMAL LOW (ref 6–20)
CO2: 21 mmol/L — ABNORMAL LOW (ref 22–32)
Calcium: 8.5 mg/dL — ABNORMAL LOW (ref 8.9–10.3)
Chloride: 95 mmol/L — ABNORMAL LOW (ref 98–111)
Creatinine, Ser: 0.86 mg/dL (ref 0.61–1.24)
GFR, Estimated: >60 mL/min (ref >60)
Glucose, Bld: 80 mg/dL (ref 70–99)
Potassium: 3.4 mmol/L — ABNORMAL LOW (ref 3.5–5.1)
Sodium: 132 mmol/L — ABNORMAL LOW (ref 135–145)

## 2022-05-11 LAB — LIPASE, BLOOD: Lipase: 64 U/L — ABNORMAL HIGH (ref 11–51)

## 2022-05-11 MED ORDER — DEXTROSE 50 % IV SOLN
INTRAVENOUS | Status: AC
  Filled 2022-05-11: qty 50

## 2022-05-11 MED ORDER — DEXTROSE 50 % IV SOLN
25.0000 g | INTRAVENOUS | Status: AC
  Administered 2022-05-11: 25 g via INTRAVENOUS

## 2022-05-11 MED ORDER — POTASSIUM CHLORIDE CRYS ER 20 MEQ PO TBCR
40.0000 meq | EXTENDED_RELEASE_TABLET | Freq: Once | ORAL | Status: AC
  Administered 2022-05-11: 40 meq via ORAL
  Filled 2022-05-11: qty 2

## 2022-05-11 MED ORDER — INSULIN (MYXREDLIN) INFUSION FOR HYPERTRIGLYCERIDEMIA
0.1000 [IU]/kg/h | INTRAVENOUS | Status: DC
  Administered 2022-05-11 – 2022-05-12 (×3): 0.1 [IU]/kg/h via INTRAVENOUS
  Filled 2022-05-11 (×3): qty 100

## 2022-05-11 MED ORDER — DEXTROSE 50 % IV SOLN: 12.5000 g | INTRAVENOUS | Status: DC | PRN

## 2022-05-11 NOTE — Progress Notes (Signed)
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 Patient tells me is not very compliant with his medical regimen and does not take his medication on a regular basis. He has not had a bowel movement in the last 6 days. He receiving narcotics every 2 hours for abdominal pain.  Objective: Vital signs in last 24 hours: Temp:  [99 F (37.2 C)-100.8 F (38.2 C)] 100.8 F (38.2 C) (03/24 1950) Pulse Rate:  [85-96] 85 (03/25 0800) Resp:  [17-31] 19 (03/25 0800) BP: (119-156)/(59-85) 130/68 (03/25 0400) SpO2:  [96 %-99 %] 97 % (03/25 0800) Last BM Date : 05/06/22  Intake/Output from previous day: 03/24 0701 - 03/25 0700 In: 4303.6 [I.V.:4005.4; IV Piggyback:298.2] Out: [id number] [Urine:3275] Intake/Output this shift: Total I/O In: 1227.8 [I.V.:1127.9; IV Piggyback:99.9] Out: -   General appearance: alert, cooperative, appears stated age, fatigued, no distress, and morbidly obese Resp: clear to auscultation bilaterally Cardio: regular rate and rhythm, S1, S2 normal, no murmur, click, rub or gallop GI: soft, mild tenderness on palpation with no guarding rebound or rigidity; bowel sounds are hypoactive; no masses,  no organomegaly  Lab Results: No results for input(s): "WBC", "HGB", "HCT", "PLT" in the last 72 hours. BMET Recent Labs    05/09/22 1840 05/10/22 0313 05/11/22 0749  NA 133* 130* 132*  K 3.7 3.6 3.4*  CL 96* 99 95*  CO2 23 21* 21*  GLUCOSE 134* 147* 80  BUN <5* <5* <5*  CREATININE 0.94 0.97 0.86  CALCIUM 8.6* 8.5* 8.5*   Studies/Results: CT ABD PELVIS W/WO CM ONCOLOGY PANCREATIC PROTOCOL  Result Date:  05/11/2022 CLINICAL DATA:  Acute pancreatitis, follow-up exam EXAM: CT ABDOMEN AND PELVIS WITHOUT AND WITH CONTRAST TECHNIQUE: Multidetector CT imaging of the abdomen and pelvis was performed following the standard protocol before and following the bolus administration of intravenous contrast. RADIATION DOSE REDUCTION: This exam was performed according to the departmental dose-optimization program which includes automated exposure control, adjustment of the mA and/or kV according to patient size and/or use of iterative reconstruction technique. CONTRAST:  149mL OMNIPAQUE IOHEXOL 350 MG/ML SOLN COMPARISON:  05/07/2022 FINDINGS: Lower chest: Small left pleural effusion is noted with basilar atelectasis. This is new from the prior exam. Hepatobiliary: Fatty infiltration of the liver is noted. The gallbladder is well distended. Pancreas: The pancreas demonstrates significant peripancreatic inflammatory change increased when compared with the prior exam particularly along the anterior margin of the pancreas and extending inferiorly along the anterior aspect of Gerota's fascia on the left. There is again noted decreased enhancement in the pancreatic tail similar to that seen on the prior exam with less distinct margins the pancreatic tail consistent with pancreatic necrosis. Multiple delayed images fail to demonstrate any significant enhancement in this area consistent with pancreatic necrosis. No definitive pseudocyst is noted at this time. Spleen: Normal in size without focal abnormality. Adrenals/Urinary Tract: Adrenal glands are within normal limits. Kidneys are well visualized bilaterally within normal enhancement pattern. Normal delayed excretion is seen bilaterally. The bladder is well distended. Stomach/Bowel: The colon shows no obstructive or inflammatory changes. Inflammatory changes from the pancreatic phlegmon extend into the left lateral pericolic gutter. The appendix is within normal limits. Small bowel  shows no obstructive changes. Some mild hyperemia is noted  with third and fourth portions of the duodenum as well as the proximal jejunum likely reactive to the adjacent pancreatic phlegmon. Vascular/Lymphatic: Abdominal aorta is within normal limits. No significant lymphadenopathy is seen. Splenic vein is patent although somewhat attenuated by mass effect from the adjacent pancreatic phlegmon. SMV and portal vein are patent. Reproductive: Prostate is unremarkable. Other: No free fluid is noted in the pelvis.  No herniation is seen. Musculoskeletal: Bilateral pars defects are noted at L5. No anterolisthesis is seen. IMPRESSION: Changes consistent with acute pancreatitis with increase in the degree of peripancreatic phlegmon as described. No discrete pseudocyst is noted. Some compensatory inflammatory changes are noted in the small bowel and descending colon related to the localized changes. Decreased enhancement within the pancreatic tail similar to that seen on the prior exam consistent with pancreatic necrosis. No delayed enhancement is identified. New small left pleural effusion with left basilar atelectasis. Fatty liver. Electronically Signed   By: Inez Catalina M.D.   On: 05/11/2022 00:04    Medications: I have reviewed the patient's current medications. Prior to Admission:  Medications Prior to Admission  Medication Sig Dispense Refill Last Dose   atorvastatin (LIPITOR) 80 MG tablet Take 1 tablet (80 mg total) by mouth daily at 6 PM. 30 tablet 0 05/06/2022   fenofibrate 160 MG tablet Take 160 mg by mouth daily.   05/06/2022   insulin glargine (LANTUS) 100 UNIT/ML Solostar Pen Inject 10 Units into the skin daily. 15 mL 0 05/06/2022   metFORMIN (GLUCOPHAGE) 500 MG tablet Take 2 tablets in the AM and 1 tablet in the PM   05/06/2022   metoprolol succinate (TOPROL-XL) 50 MG 24 hr tablet Take 1 tablet (50 mg total) by mouth daily. 30 tablet 0 05/06/2022   pantoprazole (PROTONIX) 40 MG tablet Take 40 mg by mouth  daily.   05/06/2022   blood glucose meter kit and supplies KIT Dispense based on patient and insurance preference. Use up to four times daily as directed. (FOR ICD-9 250.00, 250.01). 1 each 0    blood glucose meter kit and supplies Dispense based on patient and insurance preference. Use up to four times daily as directed. (FOR ICD-10 E10.9, E11.9). 1 each 0    Scheduled:  atorvastatin  80 mg Oral q1800   Chlorhexidine Gluconate Cloth  6 each Topical Daily   enoxaparin (LOVENOX) injection  60 mg Subcutaneous Q24H   fenofibrate  160 mg Oral Daily   metoprolol succinate  50 mg Oral Daily   pantoprazole (PROTONIX) IV  40 mg Intravenous Q24H   Continuous:  dextrose 150 mL/hr at 05/11/22 1600   insulin 0.1 Units/kg/hr (05/11/22 1600)   lactated ringers Stopped (05/10/22 0716)   meropenem (MERREM) IV Stopped (05/11/22 1546)   [id number] **OR** acetaminophen, dextrose, HYDROmorphone (DILAUDID) injection, naLOXone (NARCAN)  injection, ondansetron **OR** ondansetron (ZOFRAN) IV, mouth rinse, oxyCODONE, polyethylene glycol  Assessment/Plan: 1) Acute recurrent pancreatitis due to hypertriglyceridemia; abnormal CT scan-his triglycerides were over 5000 on admission down to 726 today.  Per my discussion with Dr. Laverna Peace, we will allow him some clears and advance his diet as tolerated. I have advised the patient to avoid taking narcotics every 2 hours and maybe increase the interval to every 4 hours so we can advance his diet depending on how he is responding.  Continue fenofibrate and atorvastatin and dextrose along with insulin infusion. 2) HTN. 3) AODM. 4) Class II obesity.   LOS: 4 days   Juanita Craver 05/11/2022, 1:51 PM

## 2022-05-11 NOTE — Progress Notes (Addendum)
       Overnight   NAME: Douglas [name]. MRN: [id number] DOB : 1985/05/01    Date of Service   05/11/2022   HPI/Events of Note   Notified by RN for patient inability to maintain CBG over several checks in spite of D50 and rate adjustment to IVF.  Modified Insulin to "adjusted rate" of 99.9 kg calculation Returned D10 back to ordered dose. Triglycerides have reduced to 971mg /dL     Latest Reference Range & Units 05/10/22 03:13 05/10/22 18:11  Triglycerides <150 mg/dL 1,840 (H) 971 (H)  (H): Data is abnormally high    Interventions/ Plan   Insulin adjustment IVF continues Lab draw - pending  4.  Continue CBGs      Update After Adjusted rate insulin, CBGs maintain adequate level and:    Latest Reference Range & Units 05/11/22 04:46  Triglycerides <150 mg/dL 726 (H)  (H): Data is abnormally high     Gershon Cull BSN MSNA MSN ACNPC-AG Acute Care Nurse Practitioner [place]

## 2022-05-11 NOTE — Progress Notes (Signed)
PROGRESS NOTE  Douglas [name]. [id number] DOB: 1986/01/20 DOA: 05/06/2022 PCP: Tomasa Hose, NP   LOS: 4 days   Brief Narrative / Interim history: 37 year old male with DM2, obesity, HLD/hypertriglyceridemia, HTN, recurrent pancreatitis due to hypertriglyceridemia with peripancreatic fluid collection comes into the hospital with abdominal pain after eating heavy meal.  CT abdomen pelvis showed acute pancreatitis with incipient pancreatic necrosis.  He was admitted to the hospital and GI was consulted  Subjective / 24h Interval events: Feeling about the same this morning, continues to have abdominal soreness.  No nausea or vomiting.  Pain is controlled  Assesement and Plan: Principal Problem:   Acute pancreatitis Active Problems:   Hypertension   Hypertriglyceridemia   Type 2 diabetes mellitus without complication (HCC)   Class II obesity   AKI (acute kidney injury) ([place])   Principal problem Acute recurrent pancreatitis due to hypertriglyceridemia -repeat CT scan on 3/24 showed acute pancreatitis with peripancreatic phlegmon without pseudocyst.  There is a degree of pancreatic necrosis in the pancreatic tail similar to the prior CT.  Gastroenterology consulted, appreciate input.  Continue fenofibrate, atorvastatin, continue dextrose and insulin infusion -Has been placed on empiric antibiotics, continue -Continue fluids, n.p.o., pain control  Active problems Acute kidney injury -continue fluids, creatinine has now normalized  Hypokalemia -continue to monitor and replete as indicated  Hypertriglyceridemia -continue statin, fenofibrate, insulin infusion.  Triglycerides around 700 this morning  DM2 -repeat A1c pending  Obesity, class II -BMI 39, he would benefit from weight loss  Scheduled Meds:  atorvastatin  80 mg Oral q1800   Chlorhexidine Gluconate Cloth  6 each Topical Daily   enoxaparin (LOVENOX) injection  60 mg Subcutaneous Q24H   fenofibrate  160 mg  Oral Daily   metoprolol succinate  50 mg Oral Daily   pantoprazole (PROTONIX) IV  40 mg Intravenous Q24H   Continuous Infusions:  dextrose 150 mL/hr at 05/11/22 0800   insulin 0.1 Units/kg/hr (05/11/22 0800)   lactated ringers Stopped (05/10/22 0716)   meropenem (MERREM) IV 200 mL/hr at 05/11/22 0800   PRN Meds:.acetaminophen **OR** acetaminophen, dextrose, HYDROmorphone (DILAUDID) injection, naLOXone (NARCAN)  injection, ondansetron **OR** ondansetron (ZOFRAN) IV, mouth rinse, oxyCODONE, polyethylene glycol  Current Outpatient Medications  Medication Instructions   atorvastatin (LIPITOR) 80 mg, Oral, Daily-1800   blood glucose meter kit and supplies KIT Dispense based on patient and insurance preference. Use up to four times daily as directed. (FOR ICD-9 250.00, 250.01).   blood glucose meter kit and supplies Dispense based on patient and insurance preference. Use up to four times daily as directed. (FOR ICD-10 E10.9, E11.9).   fenofibrate 160 mg, Oral, Daily   insulin glargine (LANTUS) 10 Units, Subcutaneous, Daily   metFORMIN (GLUCOPHAGE) 500 MG tablet Take 2 tablets in the AM and 1 tablet in the PM    metoprolol succinate (TOPROL-XL) 50 mg, Oral, Daily   pantoprazole (PROTONIX) 40 mg, Oral, Daily    Diet Orders (From admission, onward)     Start     Ordered   05/10/22 1201  Diet NPO time specified  Diet effective now        05/10/22 1201            DVT prophylaxis:    Lab Results  Component Value Date   PLT 228 05/08/2022      Code Status: Full Code  Family Communication: no family at bedside   Status is: Inpatient  Remains inpatient appropriate because: severity of illness  Level of care:  Stepdown  Consultants:  GI  Objective: Vitals:   05/11/22 0300 05/11/22 0400 05/11/22 0700 05/11/22 0800  BP: (!) 150/85 130/68    Pulse: 93 91 87 85  Resp: 17 (!) 31 (!) 23 19  Temp:      TempSrc:      SpO2: 97% 96% 97% 97%  Weight:      Height:         Intake/Output Summary (Last 24 hours) at 05/11/2022 0942 Last data filed at 05/11/2022 0800 Gross per 24 hour  Intake 3995.17 ml  Output 3275 ml  Net 720.17 ml   Wt Readings from Last 3 Encounters:  05/07/22 131.1 kg  08/24/20 (!) 147.4 kg  08/25/19 117.9 kg    Examination:  Constitutional: NAD Eyes: no scleral icterus ENMT: Mucous membranes are moist.  Neck: normal, supple Respiratory: clear to auscultation bilaterally, no wheezing, no crackles. Normal respiratory effort. No accessory muscle use.  Cardiovascular: Regular rate and rhythm, no murmurs / rubs / gallops. No LE edema.  Abdomen: non distended, mild tenderness throughout, no guarding or rebound Musculoskeletal: no clubbing / cyanosis.   Data Reviewed: I have independently reviewed following labs and imaging studies   CBC Recent Labs  Lab 05/06/22 2118 05/07/22 0802 05/08/22 0456  WBC 12.0* 12.0* 9.8  HGB 16.9 16.4 14.4  HCT 47.7 46.1 44.0  PLT 246 215 228  MCV 83.2 85.7 87.8  MCH 29.5 30.5 28.7  MCHC 35.4 35.6 32.7  RDW 13.1 13.5 13.8    Recent Labs  Lab 05/06/22 2118 05/07/22 0802 05/08/22 0456 05/09/22 0858 05/09/22 1840 05/10/22 0313  NA 127* 133* 132* 129* 133* 130*  K 3.5 3.5 4.1 2.7* 3.7 3.6  CL 97* 105 103 98 96* 99  CO2 15* 16* 17* 23 23 21*  GLUCOSE 460* 212* 152* 112* 134* 147*  BUN 12 12 9  <5* <5* <5*  CREATININE 1.08 1.92* 1.34* 0.90 0.94 0.97  CALCIUM 9.5 8.6* 7.8* 8.1* 8.6* 8.5*  AST 42* 46* 52*  --   --   --   ALT 41 34 27  --   --   --   ALKPHOS 182* 96 66  --   --   --   BILITOT 0.7 0.7 1.5*  --   --   --   ALBUMIN 4.8 3.9 3.1*  --   --   --   MG  --  2.3 2.2  --   --   --     ------------------------------------------------------------------------------------------------------------------ Recent Labs    05/10/22 1811 05/11/22 0446  TRIG 971* 726*    Lab Results  Component Value Date   HGBA1C >15.5 (H) 08/29/2019    ------------------------------------------------------------------------------------------------------------------ No results for input(s): "TSH", "T4TOTAL", "T3FREE", "THYROIDAB" in the last 72 hours.  Invalid input(s): "FREET3"  Cardiac Enzymes No results for input(s): "CKMB", "TROPONINI", "MYOGLOBIN" in the last 168 hours.  Invalid input(s): "CK" ------------------------------------------------------------------------------------------------------------------ No results found for: "BNP"  CBG: Recent Labs  Lab 05/11/22 0417 05/11/22 0604 05/11/22 0712 05/11/22 0818 05/11/22 0930  GLUCAP 113* 102* 92 92 74    Recent Results (from the past 240 hour(s))  MRSA Next Gen by PCR, Nasal     Status: None   Collection Time: 05/07/22  7:46 AM   Specimen: Nasal Mucosa; Nasal Swab  Result Value Ref Range Status   MRSA by PCR Next Gen NOT DETECTED NOT DETECTED Final    Comment: (NOTE) The GeneXpert MRSA Assay (FDA approved for NASAL specimens only), is one  component of a comprehensive MRSA colonization surveillance program. It is not intended to diagnose MRSA infection nor to guide or monitor treatment for MRSA infections. Test performance is not FDA approved in patients less than 63 years old. Performed at [facility], [place] [address] [place], [place] [address]      Radiology Studies: CT ABD PELVIS W/WO CM ONCOLOGY PANCREATIC PROTOCOL  Result Date: 05/11/2022 CLINICAL DATA:  Acute pancreatitis, follow-up exam EXAM: CT ABDOMEN AND PELVIS WITHOUT AND WITH CONTRAST TECHNIQUE: Multidetector CT imaging of the abdomen and pelvis was performed following the standard protocol before and following the bolus administration of intravenous contrast. RADIATION DOSE REDUCTION: This exam was performed according to the departmental dose-optimization program which includes automated exposure control, adjustment of the mA and/or kV according to patient size and/or use of  iterative reconstruction technique. CONTRAST:  156mL OMNIPAQUE IOHEXOL 350 MG/ML SOLN COMPARISON:  05/07/2022 FINDINGS: Lower chest: Small left pleural effusion is noted with basilar atelectasis. This is new from the prior exam. Hepatobiliary: Fatty infiltration of the liver is noted. The gallbladder is well distended. Pancreas: The pancreas demonstrates significant peripancreatic inflammatory change increased when compared with the prior exam particularly along the anterior margin of the pancreas and extending inferiorly along the anterior aspect of Gerota's fascia on the left. There is again noted decreased enhancement in the pancreatic tail similar to that seen on the prior exam with less distinct margins the pancreatic tail consistent with pancreatic necrosis. Multiple delayed images fail to demonstrate any significant enhancement in this area consistent with pancreatic necrosis. No definitive pseudocyst is noted at this time. Spleen: Normal in size without focal abnormality. Adrenals/Urinary Tract: Adrenal glands are within normal limits. Kidneys are well visualized bilaterally within normal enhancement pattern. Normal delayed excretion is seen bilaterally. The bladder is well distended. Stomach/Bowel: The colon shows no obstructive or inflammatory changes. Inflammatory changes from the pancreatic phlegmon extend into the left lateral pericolic gutter. The appendix is within normal limits. Small bowel shows no obstructive changes. Some mild hyperemia is noted with third and fourth portions of the duodenum as well as the proximal jejunum likely reactive to the adjacent pancreatic phlegmon. Vascular/Lymphatic: Abdominal aorta is within normal limits. No significant lymphadenopathy is seen. Splenic vein is patent although somewhat attenuated by mass effect from the adjacent pancreatic phlegmon. SMV and portal vein are patent. Reproductive: Prostate is unremarkable. Other: No free fluid is noted in the pelvis.  No  herniation is seen. Musculoskeletal: Bilateral pars defects are noted at L5. No anterolisthesis is seen. IMPRESSION: Changes consistent with acute pancreatitis with increase in the degree of peripancreatic phlegmon as described. No discrete pseudocyst is noted. Some compensatory inflammatory changes are noted in the small bowel and descending colon related to the localized changes. Decreased enhancement within the pancreatic tail similar to that seen on the prior exam consistent with pancreatic necrosis. No delayed enhancement is identified. New small left pleural effusion with left basilar atelectasis. Fatty liver. Electronically Signed   By: Inez Catalina M.D.   On: 05/11/2022 00:04     Marzetta Board, MD, PhD [facility]  Between 7 am - 7 pm I am available, please contact me via Amion (for emergencies) or Securechat (non urgent messages)  Between 7 pm - 7 am I am not available, please contact night coverage MD/APP via Amion

## 2022-05-12 LAB — COMPREHENSIVE METABOLIC PANEL WITH GFR
ALT: 35 U/L (ref 0–44)
AST: 44 U/L — ABNORMAL HIGH (ref 15–41)
Albumin: 3.2 g/dL — ABNORMAL LOW (ref 3.5–5.0)
Alkaline Phosphatase: 68 U/L (ref 38–126)
Anion gap: 10 (ref 5–15)
BUN: <5 mg/dL — ABNORMAL LOW (ref 6–20)
CO2: 25 mmol/L (ref 22–32)
Calcium: 8.6 mg/dL — ABNORMAL LOW (ref 8.9–10.3)
Chloride: 97 mmol/L — ABNORMAL LOW (ref 98–111)
Creatinine, Ser: 0.88 mg/dL (ref 0.61–1.24)
GFR, Estimated: >60 mL/min
Glucose, Bld: 92 mg/dL (ref 70–99)
Potassium: 3.4 mmol/L — ABNORMAL LOW (ref 3.5–5.1)
Sodium: 132 mmol/L — ABNORMAL LOW (ref 135–145)
Total Bilirubin: 0.8 mg/dL (ref 0.3–1.2)
Total Protein: 7.1 g/dL (ref 6.5–8.1)

## 2022-05-12 LAB — CBC
HCT: 43.6 % (ref 39.0–52.0)
Hemoglobin: 13.6 g/dL (ref 13.0–17.0)
MCH: 28 pg (ref 26.0–34.0)
MCHC: 31.2 g/dL (ref 30.0–36.0)
MCV: 89.7 fL (ref 80.0–100.0)
Platelets: 201 10*3/uL (ref 150–400)
RBC: 4.86 MIL/uL (ref 4.22–5.81)
RDW: 14.9 % (ref 11.5–15.5)
WBC: 8.4 10*3/uL (ref 4.0–10.5)
nRBC: 0 % (ref 0.0–0.2)

## 2022-05-12 LAB — GLUCOSE, CAPILLARY
Glucose-Capillary: 108 mg/dL — ABNORMAL HIGH (ref 70–99)
Glucose-Capillary: 117 mg/dL — ABNORMAL HIGH (ref 70–99)
Glucose-Capillary: 129 mg/dL — ABNORMAL HIGH (ref 70–99)
Glucose-Capillary: 149 mg/dL — ABNORMAL HIGH (ref 70–99)
Glucose-Capillary: 170 mg/dL — ABNORMAL HIGH (ref 70–99)
Glucose-Capillary: 210 mg/dL — ABNORMAL HIGH (ref 70–99)
Glucose-Capillary: 72 mg/dL (ref 70–99)
Glucose-Capillary: 80 mg/dL (ref 70–99)
Glucose-Capillary: 85 mg/dL (ref 70–99)
Glucose-Capillary: 87 mg/dL (ref 70–99)

## 2022-05-12 LAB — TRIGLYCERIDES
Triglycerides: 460 mg/dL — ABNORMAL HIGH (ref <150)
Triglycerides: 492 mg/dL — ABNORMAL HIGH

## 2022-05-12 LAB — MAGNESIUM: Magnesium: 2.2 mg/dL (ref 1.7–2.4)

## 2022-05-12 LAB — LIPASE, BLOOD: Lipase: 70 U/L — ABNORMAL HIGH (ref 11–51)

## 2022-05-12 LAB — HEMOGLOBIN A1C
Hgb A1c MFr Bld: 14.6 % — ABNORMAL HIGH (ref 4.8–5.6)
Mean Plasma Glucose: 372 mg/dL

## 2022-05-12 MED ORDER — LACTATED RINGERS IV SOLN: INTRAVENOUS | Status: DC

## 2022-05-12 MED ORDER — INSULIN ASPART 100 UNIT/ML IJ SOLN
0.0000 [IU] | Freq: Every day | INTRAMUSCULAR | Status: DC
  Administered 2022-05-12: 2 [IU] via SUBCUTANEOUS

## 2022-05-12 MED ORDER — INSULIN ASPART 100 UNIT/ML IJ SOLN
0.0000 [IU] | Freq: Three times a day (TID) | INTRAMUSCULAR | Status: DC
  Administered 2022-05-12: 1 [IU] via SUBCUTANEOUS
  Administered 2022-05-13: 2 [IU] via SUBCUTANEOUS
  Administered 2022-05-13: 3 [IU] via SUBCUTANEOUS

## 2022-05-12 MED ORDER — POTASSIUM CHLORIDE CRYS ER 20 MEQ PO TBCR
40.0000 meq | EXTENDED_RELEASE_TABLET | Freq: Once | ORAL | Status: AC
  Administered 2022-05-12: 40 meq via ORAL
  Filled 2022-05-12: qty 2

## 2022-05-12 MED ORDER — INSULIN GLARGINE-YFGN 100 UNIT/ML ~~LOC~~ SOLN
10.0000 [IU] | Freq: Every day | SUBCUTANEOUS | Status: DC
  Administered 2022-05-12: 10 [IU] via SUBCUTANEOUS
  Filled 2022-05-12 (×2): qty 0.1

## 2022-05-12 MED ORDER — OXYCODONE HCL 5 MG PO TABS
10.0000 mg | ORAL_TABLET | ORAL | Status: DC | PRN
  Administered 2022-05-12 – 2022-05-13 (×5): 15 mg via ORAL
  Filled 2022-05-12 (×5): qty 3

## 2022-05-12 NOTE — Progress Notes (Signed)
Subjective: Feeling better.  No IV pain medications today.  Tolerating his diet.  Objective: Vital signs in last 24 hours: Temp:  [98.7 F (37.1 C)-100.6 F (38.1 C)] 99.8 F (37.7 C) (03/26 1159) Pulse Rate:  [80-97] 92 (03/26 0910) Resp:  [17-29] 17 (03/26 0910) BP: (97-142)/(46-89) 123/67 (03/26 0910) SpO2:  [93 %-100 %] 99 % (03/26 0910) Last BM Date : 05/06/22  Intake/Output from previous day: 03/25 0701 - 03/26 0700 In: 5161 [P.O.:438; I.V.:4341.4; IV Piggyback:381.6] Out: 3075 [Urine:3075] Intake/Output this shift: Total I/O In: 689.6 [I.V.:689.6] Out: 425 [Urine:425]  General appearance: alert and no distress Resp: clear to auscultation bilaterally Cardio: regular rate and rhythm, S1, S2 normal, no murmur, click, rub or gallop GI: tenderness with mild palpation Extremities: extremities normal, atraumatic, no cyanosis or edema  Lab Results: Recent Labs    05/12/22 0630  WBC 8.4  HGB 13.6  HCT 43.6  PLT 201   BMET Recent Labs    05/10/22 0313 05/11/22 0749 05/12/22 0630  NA 130* 132* 132*  K 3.6 3.4* 3.4*  CL 99 95* 97*  CO2 21* 21* 25  GLUCOSE 147* 80 92  BUN <5* <5* <5*  CREATININE 0.97 0.86 0.88  CALCIUM 8.5* 8.5* 8.6*   LFT Recent Labs    05/12/22 0630  PROT 7.1  ALBUMIN 3.2*  AST 44*  ALT 35  ALKPHOS 68  BILITOT 0.8   PT/INR No results for input(s): "LABPROT", "INR" in the last 72 hours. Hepatitis Panel No results for input(s): "HEPBSAG", "HCVAB", "HEPAIGM", "HEPBIGM" in the last 72 hours. C-Diff No results for input(s): "CDIFFTOX" in the last 72 hours. Fecal Lactopherrin No results for input(s): "FECLLACTOFRN" in the last 72 hours.  Studies/Results: No results found.  Medications: Scheduled:  atorvastatin  80 mg Oral q1800   Chlorhexidine Gluconate Cloth  6 each Topical Daily   enoxaparin (LOVENOX) injection  60 mg Subcutaneous Q24H   fenofibrate  160 mg Oral Daily   insulin aspart  0-5 Units Subcutaneous QHS   insulin  aspart  0-9 Units Subcutaneous TID WC   insulin glargine-yfgn  10 Units Subcutaneous Daily   metoprolol succinate  50 mg Oral Daily   pantoprazole (PROTONIX) IV  40 mg Intravenous Q24H   Continuous:  lactated ringers 75 mL/hr at 05/12/22 1113   meropenem (MERREM) IV Stopped (05/12/22 1354)    Assessment/Plan: 1) Hypertriglyceridemia induced acute pancreatitis. 2) Abdominal pain.   The patient is afebrile and he is progressing.  His WBC count is also improving.  Plan: 1) Continue with supportive care. 2) ? D/C home tomorrow.  LOS: 5 days   Kennedee Kitzmiller D 05/12/2022, 4:47 PM

## 2022-05-12 NOTE — Progress Notes (Signed)
PROGRESS NOTE  Douglas [name]. 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  CT abdomen pelvis showed acute pancreatitis with incipient pancreatic necrosis.  He was admitted to the hospital and GI was consulted  Subjective / 24h Interval events: Overall he is feeling better.  Was able to go off IV pain meds for 6 hours overnight.  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  There is a degree of pancreatic necrosis in the pancreatic tail similar to the prior CT.  Gastroenterology consulted, appreciate input.  Continue fenofibrate, atorvastatin, patient was obtained on dextrose and insulin infusion, with significant improvement in his triglycerides I will convert to subcutaneous insulin this morning  Active problems Acute kidney injury -continue fluids, creatinine has now normalized  Hypokalemia -continue to monitor and replete as indicated  Hypertriglyceridemia -continue statin, fenofibrate, triglycerides decreased now to 460.  DM2 -repeat A1c significantly elevated at 14.4, placed on subcutaneous insulin as well as sliding scale.  Will need insulin on discharge  Obesity, class II -BMI 39, he would benefit from weight loss  Scheduled Meds:  atorvastatin  80 mg Oral q1800   Chlorhexidine Gluconate Cloth  6 each Topical  Daily   enoxaparin (LOVENOX) injection  60 mg Subcutaneous Q24H   fenofibrate  160 mg Oral Daily   insulin aspart  0-5 Units Subcutaneous QHS   insulin aspart  0-9 Units Subcutaneous TID WC   insulin glargine-yfgn  10 Units Subcutaneous Daily   metoprolol succinate  50 mg Oral Daily   pantoprazole (PROTONIX) IV  40 mg Intravenous Q24H   Continuous Infusions:  lactated ringers     meropenem (MERREM) IV Stopped (05/12/22 0607)   PRN Meds:.acetaminophen **OR** acetaminophen, dextrose, HYDROmorphone (DILAUDID) injection, naLOXone (NARCAN)  injection, ondansetron **OR** ondansetron (ZOFRAN) IV, mouth rinse, oxyCODONE, polyethylene glycol  Current Outpatient Medications  Medication Instructions   atorvastatin (LIPITOR) 80 mg, Oral, Daily-1800   blood glucose meter kit and supplies KIT Dispense based on patient and insurance preference. Use up to four times daily as directed. (FOR ICD-9 250.00, 250.01).   blood glucose meter kit and supplies Dispense based on patient and insurance preference. Use up to four times daily as directed. (FOR ICD-10 E10.9, E11.9).   fenofibrate 160 mg, Oral, Daily   insulin glargine (LANTUS) 10 Units, Subcutaneous, Daily   metFORMIN (GLUCOPHAGE) 500 MG tablet Take 2 tablets in the AM and 1 tablet in the PM    metoprolol succinate (TOPROL-XL) 50 mg, Oral, Daily   pantoprazole (PROTONIX) 40 mg, Oral, Daily    Diet Orders (From admission, onward)     Start     Ordered   05/12/22 1051  Diet clear liquid Room service appropriate? Yes; Fluid consistency: Thin  Diet effective now       Question Answer Comment  Room service appropriate? Yes   Fluid consistency: Thin      05/12/22 1051  DVT prophylaxis:    Lab Results  Component Value Date   PLT 201 05/12/2022      Code Status: Full Code  Family Communication: no family at bedside, discussed with his mother over the phone  Status is: Inpatient  Remains inpatient appropriate because:  severity of illness  Level of care: Stepdown  Consultants:  GI  Objective: Vitals:   05/12/22 0700 05/12/22 0800 05/12/22 0900 05/12/22 0910  BP: 128/75 125/69 123/67 123/67  Pulse: 90 89 91 92  Resp:   17 17  Temp:  99.3 F (37.4 C)    TempSrc:  Oral    SpO2: 97% 100% 99% 99%  Weight:      Height:        Intake/Output Summary (Last 24 hours) at 05/12/2022 1051 Last data filed at 05/12/2022 [id number] Gross per 24 hour  Intake 3933.23 ml  Output 3075 ml  Net 858.23 ml    Wt Readings from Last 3 Encounters:  05/07/22 131.1 kg  08/24/20 (!) 147.4 kg  08/25/19 117.9 kg    Examination:  Constitutional: NAD Eyes: lids and conjunctivae normal, no scleral icterus ENMT: mmm Neck: normal, supple Respiratory: clear to auscultation bilaterally, no wheezing, no crackles. Normal respiratory effort.  Cardiovascular: Regular rate and rhythm, no murmurs / rubs / gallops. No LE edema. Abdomen: soft, mild tenderness without guarding or rebound Skin: no rashes Neurologic: no focal deficits, equal strength  Data Reviewed: I have independently reviewed following labs and imaging studies   CBC Recent Labs  Lab 05/06/22 2118 05/07/22 0802 05/08/22 0456 05/12/22 0630  WBC 12.0* 12.0* 9.8 8.4  HGB 16.9 16.4 14.4 13.6  HCT 47.7 46.1 44.0 43.6  PLT 246 215 228 201  MCV 83.2 85.7 87.8 89.7  MCH 29.5 30.5 28.7 28.0  MCHC 35.4 35.6 32.7 31.2  RDW 13.1 13.5 13.8 14.9     Recent Labs  Lab 05/06/22 2118 05/07/22 0802 05/08/22 0456 05/09/22 0858 05/09/22 1840 05/10/22 0313 05/11/22 0749 05/12/22 0630  NA 127* 133* 132* 129* 133* 130* 132* 132*  K 3.5 3.5 4.1 2.7* 3.7 3.6 3.4* 3.4*  CL 97* 105 103 98 96* 99 95* 97*  CO2 15* 16* 17* 23 23 21* 21* 25  GLUCOSE 460* 212* 152* 112* 134* 147* 80 92  BUN 12 12 9  <5* <5* <5* <5* <5*  CREATININE 1.08 1.92* 1.34* 0.90 0.94 0.97 0.86 0.88  CALCIUM 9.5 8.6* 7.8* 8.1* 8.6* 8.5* 8.5* 8.6*  AST 42* 46* 52*  --   --   --   --  44*  ALT 41  34 27  --   --   --   --  35  ALKPHOS 182* 96 66  --   --   --   --  68  BILITOT 0.7 0.7 1.5*  --   --   --   --  0.8  ALBUMIN 4.8 3.9 3.1*  --   --   --   --  3.2*  MG  --  2.3 2.2  --   --   --   --  2.2  HGBA1C  --   --  14.4*  --   --   --   --   --      ------------------------------------------------------------------------------------------------------------------ Recent Labs    05/11/22 1634 05/12/22 0630  TRIG 591* 460*     Lab Results  Component Value Date   HGBA1C 14.4 (H) 05/08/2022   ------------------------------------------------------------------------------------------------------------------ No results for input(s): "TSH", "  T4TOTAL", "T3FREE", "THYROIDAB" in the last 72 hours.  Invalid input(s): "FREET3"  Cardiac Enzymes No results for input(s): "CKMB", "TROPONINI", "MYOGLOBIN" in the last 168 hours.  Invalid input(s): "CK" ------------------------------------------------------------------------------------------------------------------ No results found for: "BNP"  CBG: Recent Labs  Lab 05/12/22 0432 05/12/22 0745 05/12/22 0907 05/12/22 0949 05/12/22 1045  GLUCAP 129* 80 72 87 108*     Recent Results (from the past 240 hour(s))  MRSA Next Gen by PCR, Nasal     Status: None   Collection Time: 05/07/22  7:46 AM   Specimen: Nasal Mucosa; Nasal Swab  Result Value Ref Range Status   MRSA by PCR Next Gen NOT DETECTED NOT DETECTED Final    Comment: (NOTE) The GeneXpert MRSA Assay (FDA approved for NASAL specimens only), is one component of a comprehensive MRSA colonization surveillance program. It is not intended to diagnose MRSA infection nor to guide or monitor treatment for MRSA infections. Test performance is not FDA approved in patients less than 47 years old. Performed at [facility], [place] [address], [place],  [address]      Radiology Studies: No results found.   Marzetta Board, MD, PhD [facility]  Between 7 am - 7 pm I am available, please contact me via Amion (for emergencies) or Securechat (non urgent messages)  Between 7 pm - 7 am I am not available, please contact night coverage MD/APP via Amion

## 2022-05-13 ENCOUNTER — Other Ambulatory Visit (HOSPITAL_COMMUNITY): Payer: Self-pay

## 2022-05-13 LAB — CBC
HCT: 37.7 % — ABNORMAL LOW (ref 39.0–52.0)
Hemoglobin: 11.9 g/dL — ABNORMAL LOW (ref 13.0–17.0)
MCH: 27.8 pg (ref 26.0–34.0)
MCHC: 31.6 g/dL (ref 30.0–36.0)
MCV: 88.1 fL (ref 80.0–100.0)
Platelets: 276 10*3/uL (ref 150–400)
RBC: 4.28 MIL/uL (ref 4.22–5.81)
RDW: 14.6 % (ref 11.5–15.5)
WBC: 8.1 10*3/uL (ref 4.0–10.5)
nRBC: 0 % (ref 0.0–0.2)

## 2022-05-13 LAB — TRIGLYCERIDES: Triglycerides: 520 mg/dL — ABNORMAL HIGH (ref <150)

## 2022-05-13 LAB — COMPREHENSIVE METABOLIC PANEL
ALT: 30 U/L (ref 0–44)
AST: 37 U/L (ref 15–41)
Albumin: 2.7 g/dL — ABNORMAL LOW (ref 3.5–5.0)
Alkaline Phosphatase: 74 U/L (ref 38–126)
Anion gap: 7 (ref 5–15)
BUN: 7 mg/dL (ref 6–20)
CO2: 25 mmol/L (ref 22–32)
Calcium: 8.6 mg/dL — ABNORMAL LOW (ref 8.9–10.3)
Chloride: 98 mmol/L (ref 98–111)
Creatinine, Ser: 0.9 mg/dL (ref 0.61–1.24)
GFR, Estimated: >60 mL/min (ref >60)
Glucose, Bld: 230 mg/dL — ABNORMAL HIGH (ref 70–99)
Potassium: 4.1 mmol/L (ref 3.5–5.1)
Sodium: 130 mmol/L — ABNORMAL LOW (ref 135–145)
Total Bilirubin: 0.9 mg/dL (ref 0.3–1.2)
Total Protein: 6.6 g/dL (ref 6.5–8.1)

## 2022-05-13 LAB — GLUCOSE, CAPILLARY
Glucose-Capillary: 215 mg/dL — ABNORMAL HIGH (ref 70–99)
Glucose-Capillary: 199 mg/dL — ABNORMAL HIGH (ref 70–99)

## 2022-05-13 LAB — MAGNESIUM: Magnesium: 2 mg/dL (ref 1.7–2.4)

## 2022-05-13 LAB — LIPASE, BLOOD: Lipase: 69 U/L — ABNORMAL HIGH (ref 11–51)

## 2022-05-13 MED ORDER — AMOXICILLIN-POT CLAVULANATE 875-125 MG PO TABS
1.0000 | ORAL_TABLET | Freq: Two times a day (BID) | ORAL | 0 refills | Status: AC
  Filled 2022-05-13: qty 10, 5d supply, fill #0

## 2022-05-13 MED ORDER — INSULIN GLARGINE 100 UNIT/ML SOLOSTAR PEN
15.0000 [IU] | PEN_INJECTOR | Freq: Every day | SUBCUTANEOUS | 0 refills | Status: DC
  Filled 2022-05-13: qty 6, 40d supply, fill #0

## 2022-05-13 MED ORDER — ATORVASTATIN CALCIUM 80 MG PO TABS
80.0000 mg | ORAL_TABLET | Freq: Every day | ORAL | 0 refills | Status: AC
  Filled 2022-05-13: qty 30, 30d supply, fill #0

## 2022-05-13 MED ORDER — METFORMIN HCL 500 MG PO TABS
500.0000 mg | ORAL_TABLET | Freq: Two times a day (BID) | ORAL | 0 refills | Status: DC
  Filled 2022-05-13: qty 120, 40d supply, fill #0

## 2022-05-13 MED ORDER — OXYCODONE HCL 15 MG PO TABS
15.0000 mg | ORAL_TABLET | ORAL | 0 refills | Status: AC | PRN
  Filled 2022-05-13: qty 30, 5d supply, fill #0

## 2022-05-13 MED ORDER — FREESTYLE LIBRE 3 SENSOR MISC: 1.0000 | 0 refills | Status: DC

## 2022-05-13 MED ORDER — INSULIN GLARGINE-YFGN 100 UNIT/ML ~~LOC~~ SOLN
15.0000 [IU] | Freq: Every day | SUBCUTANEOUS | Status: DC
  Administered 2022-05-13: 15 [IU] via SUBCUTANEOUS
  Filled 2022-05-13: qty 0.15

## 2022-05-13 MED ORDER — FENOFIBRATE 160 MG PO TABS
160.0000 mg | ORAL_TABLET | Freq: Every day | ORAL | 0 refills | Status: DC
  Filled 2022-05-13: qty 30, 30d supply, fill #0

## 2022-05-13 MED ORDER — TECHLITE PEN NEEDLES 32G X 4 MM MISC
0 refills | Status: DC
  Filled 2022-05-13: qty 100, 100d supply, fill #0

## 2022-05-13 NOTE — Progress Notes (Signed)
Patient received prescription medications directly from pharmacy. Discharge education given no needs at this time

## 2022-05-13 NOTE — Inpatient Diabetes Management (Deleted)
Inpatient Diabetes Program Recommendations  AACE/ADA: New Consensus Statement on Inpatient Glycemic Control (2015)  Target Ranges:  Prepandial:   less than 140 mg/dL      Peak postprandial:   less than 180 mg/dL (1-2 hours)      Critically ill patients:  140 - 180 mg/dL   Lab Results  Component Value Date   GLUCAP 215 (H) 05/13/2022   HGBA1C 14.6 (H) 05/11/2022    Review of Glycemic Control  Latest Reference Range & Units 05/12/22 16:34 05/12/22 21:04 05/13/22 07:19  Glucose-Capillary 70 - 99 mg/dL 149 (H) 210 (H) 215 (H)  (H): Data is abnormally high  Diabetes history: DM2 Outpatient Diabetes medications: Lantus 10 units QD, Metformin 1000 mg QAM & 500 mg QHS Current orders for Inpatient glycemic control: Novolog 0-9 units TID and 0-5 units QHS,   Inpatient Diabetes Program Recommendations:

## 2022-05-13 NOTE — Plan of Care (Signed)
  Problem: Education: Goal: Knowledge of General Education information will improve Description: Including pain rating scale, medication(s)/side effects and non-pharmacologic comfort measures Outcome: Progressing   Problem: Clinical Measurements: Goal: Ability to maintain clinical measurements within normal limits will improve Outcome: Progressing Goal: Diagnostic test results will improve Outcome: Progressing   Problem: Nutrition: Goal: Adequate nutrition will be maintained Outcome: Progressing   Problem: Coping: Goal: Level of anxiety will decrease Outcome: Progressing   Problem: Pain Managment: Goal: General experience of comfort will improve Outcome: Progressing   Problem: Safety: Goal: Ability to remain free from injury will improve Outcome: Progressing   Problem: Health Behavior/Discharge Planning: Goal: Ability to identify and utilize available resources and services will improve Outcome: Progressing

## 2022-05-13 NOTE — Discharge Summary (Addendum)
Physician Discharge Summary  Douglas [name]. UB:[id number] DOB: 1985/07/07 DOA: 05/06/2022  PCP: Tomasa Hose, NP  Admit date: 05/06/2022 Discharge date: 05/13/2022  Admitted From: home Disposition:  home  Recommendations for Outpatient Follow-up:  Follow up with PCP in 1-2 weeks Please obtain BMP/CBC in one week  Home Health: none Equipment/Devices: none  Discharge Condition: stable CODE STATUS: Full code Diet Orders (From admission, onward)     Start     Ordered   05/12/22 1332  Diet heart healthy/carb modified Room service appropriate? Yes with Assist; Fluid consistency: Thin  Diet effective now       Question Answer Comment  Diet-HS Snack? Nothing   Room service appropriate? Yes with Assist   Fluid consistency: Thin      05/12/22 1331            HPI: Per admitting MD, Douglas [name]. is a 37 y.o. male with medical history significant of type 2 diabetes, class II obesity, hyperlipidemia/hypertriglyceridemia, hypertension, right shoulder arthropathy, rectal bleeding, history of pancreatitis, history of peripancreatic fluid collection who presented to the emergency department complaints of abdominal pain after he ate a cheeseburger with fries 2 days ago.  He had an episode of emesis.He denied fever, chills, rhinorrhea, sore throat, wheezing or hemoptysis.  No chest pain, palpitations, diaphoresis, PND, orthopnea or pitting edema of the lower extremities.  No abdominal pain, nausea, emesis, diarrhea, constipation, melena or hematochezia.  No flank pain, dysuria, frequency or hematuria.  No polyuria, polydipsia, polyphagia or blurred vision.   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 Stabilizing pancreatitis in the setting of hypertriglyceridemia.  He was initially admitted to the ICU as he required continuous insulin dextrose infusion and supportive care with pain medications, antiemetics and n.p.o. status.  GI was consulted and followed patient while hospitalized.  He underwent a repeat CT scan on 3/24 showed acute pancreatitis with peripancreatic phlegmon without pseudocyst.  There is a degree of pancreatic necrosis in the pancreatic tail similar to the prior CT. he has been improving, triglyceride levels improved, his pain has been controlled on oral agents and he is tolerating a regular diet.  He will be discharged home in stable condition, will have close follow-up with PCP.  Due to concern for infection given high fevers he was maintained on antibiotics, and will be discharged to [facility] for 5 additional days to complete a 10-day course.   Active problems Acute kidney injury -creatinine normalized with fluid  Hypokalemia -potassium is improved with repletion  Hypertriglyceridemia -continue statin, fenofibrate, insulin on discharge DM2 -repeat A1c significantly elevated at 14.4, placed on insulin on discharge.  He is to resume his metformin Obesity, class II -BMI 39, he would benefit from weight loss  Sepsis ruled out   Discharge Instructions   Allergies as of 05/13/2022   No Known Allergies      Medication List     TAKE these medications    amoxicillin-clavulanate 875-125 MG tablet Commonly known as: AUGMENTIN Take 1 tablet by mouth 2 (two) times daily for 5 days.   atorvastatin 80 MG tablet Commonly known as: LIPITOR Take 1 tablet (80 mg total) by mouth daily at 6 PM.   blood  glucose meter kit and supplies Dispense based on patient and insurance preference. Use up to four times daily as directed. (FOR ICD-10 E10.9, E11.9).   blood glucose meter kit and supplies Kit Dispense based on patient and  insurance preference. Use up to four times daily as directed. (FOR ICD-9 250.00, 250.01).   fenofibrate 160 MG tablet Take 1 tablet (160 mg total) by mouth daily.   FreeStyle Libre 3 Sensor Misc 1 each by Does not apply route every 14 (fourteen) days. Place 1 sensor on the skin every 14 days. Use to check glucose continuously   Lantus SoloStar 100 UNIT/ML Solostar Pen Generic drug: insulin glargine Inject 15 Units into the skin daily. What changed: how much to take   metFORMIN 500 MG tablet Commonly known as: GLUCOPHAGE Take 1-2 tablets (500-1,000 mg total) by mouth 2 (two) times daily with a meal. Take 2 tablets in the AM and 1 tablet in the PM What changed:  how much to take how to take this when to take this   metoprolol succinate 50 MG 24 hr tablet Commonly known as: TOPROL-XL Take 1 tablet (50 mg total) by mouth daily.   oxyCODONE 15 MG immediate release tablet Commonly known as: ROXICODONE Take 1 tablet (15 mg total) by mouth every 4 (four) hours as needed for up to 7 days for moderate pain or severe pain.   pantoprazole 40 MG tablet Commonly known as: PROTONIX Take 40 mg by mouth daily.   TechLite Pen Needles 32G X 4 MM Misc Generic drug: Insulin Pen Needle Use as directed to inject insulin.        Follow-up Information     Fonnie Jarvis A, NP Follow up in 1 week(s).   Specialty: Nurse Practitioner Contact information: [place] [address] [phone]                 Consultations: GI  Procedures/Studies:  CT ABD PELVIS W/WO CM ONCOLOGY PANCREATIC PROTOCOL  Result Date: 05/11/2022 CLINICAL DATA:  Acute pancreatitis, follow-up exam EXAM: CT ABDOMEN AND PELVIS WITHOUT AND WITH CONTRAST TECHNIQUE: Multidetector CT imaging of the abdomen and pelvis was performed following the standard protocol before and following the bolus administration of intravenous contrast. RADIATION DOSE REDUCTION: This exam was performed  according to the departmental dose-optimization program which includes automated exposure control, adjustment of the mA and/or kV according to patient size and/or use of iterative reconstruction technique. CONTRAST:  123mL OMNIPAQUE IOHEXOL 350 MG/ML SOLN COMPARISON:  05/07/2022 FINDINGS: Lower chest: Small left pleural effusion is noted with basilar atelectasis. This is new from the prior exam. Hepatobiliary: Fatty infiltration of the liver is noted. The gallbladder is well distended. Pancreas: The pancreas demonstrates significant peripancreatic inflammatory change increased when compared with the prior exam particularly along the anterior margin of the pancreas and extending inferiorly along the anterior aspect of Gerota's fascia on the left. There is again noted decreased enhancement in the pancreatic tail similar to that seen on the prior exam with less distinct margins the pancreatic tail consistent with pancreatic necrosis. Multiple delayed images fail to demonstrate any significant enhancement in this area consistent with pancreatic necrosis. No definitive pseudocyst is noted at this time. Spleen: Normal in size without focal abnormality. Adrenals/Urinary Tract: Adrenal glands are within normal limits. Kidneys are well visualized bilaterally within normal enhancement pattern. Normal delayed excretion is seen bilaterally. The bladder is well distended. Stomach/Bowel: The colon shows no obstructive or inflammatory changes. Inflammatory changes from the  pancreatic phlegmon extend into the left lateral pericolic gutter. The appendix is within normal limits. Small bowel shows no obstructive changes. Some mild hyperemia is noted with third and fourth portions of the duodenum as well as the proximal jejunum likely reactive to the adjacent pancreatic phlegmon. Vascular/Lymphatic: Abdominal aorta is within normal limits. No significant lymphadenopathy is seen. Splenic vein is patent although somewhat attenuated by  mass effect from the adjacent pancreatic phlegmon. SMV and portal vein are patent. Reproductive: Prostate is unremarkable. Other: No free fluid is noted in the pelvis.  No herniation is seen. Musculoskeletal: Bilateral pars defects are noted at L5. No anterolisthesis is seen. IMPRESSION: Changes consistent with acute pancreatitis with increase in the degree of peripancreatic phlegmon as described. No discrete pseudocyst is noted. Some compensatory inflammatory changes are noted in the small bowel and descending colon related to the localized changes. Decreased enhancement within the pancreatic tail similar to that seen on the prior exam consistent with pancreatic necrosis. No delayed enhancement is identified. New small left pleural effusion with left basilar atelectasis. Fatty liver. Electronically Signed   By: Inez Catalina M.D.   On: 05/11/2022 00:04   CT Abdomen Pelvis W Contrast  Result Date: 05/07/2022 CLINICAL DATA:  Left lower quadrant abdominal pain EXAM: CT ABDOMEN AND PELVIS WITH CONTRAST TECHNIQUE: Multidetector CT imaging of the abdomen and pelvis was performed using the standard protocol following bolus administration of intravenous contrast. RADIATION DOSE REDUCTION: This exam was performed according to the departmental dose-optimization program which includes automated exposure control, adjustment of the mA and/or kV according to patient size and/or use of iterative reconstruction technique. CONTRAST:  193mL OMNIPAQUE IOHEXOL 350 MG/ML SOLN COMPARISON:  08/25/2019 FINDINGS: Lower chest: No acute abnormality. Hepatobiliary: Mild hepatic steatosis. No enhancing intrahepatic mass. No intra or extrahepatic biliary ductal dilation. Gallbladder unremarkable. Pancreas: There is extensive peripancreatic inflammatory stranding surrounding the body and tail the pancreas in keeping with changes of acute pancreatitis. No loculated peripancreatic fluid collections or evidence of peripancreatic necrosis. There  is, however, notable hypoenhancement of the pancreatic tail in keeping with early changes of pancreatic necrosis. The pancreatic duct is not dilated. Adjacent to the region of parenchymal hypoenhancement, the splenic vein is markedly narrowed but remains patent. Spleen: Unremarkable Adrenals/Urinary Tract: Adrenal glands are unremarkable. Kidneys are normal, without renal calculi, focal lesion, or hydronephrosis. Bladder is unremarkable. Stomach/Bowel: Stomach is within normal limits. Appendix appears normal. No evidence of bowel wall thickening, distention, or inflammatory changes. No free intraperitoneal gas or fluid. Vascular/Lymphatic: No significant vascular findings are present. No enlarged abdominal or pelvic lymph nodes. Reproductive: Prostate is unremarkable. Other: No abdominal wall hernia or abnormality. Musculoskeletal: Bilateral L5 pars defects are present without associated spondylolisthesis. Of the osseous structures are otherwise unremarkable. IMPRESSION: 1. 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 No loculated peripancreatic fluid collections or evidence of peripancreatic necrosis. Follow-up CT or MRI imaging in 3-5 days may be helpful to assess evolution of the area of threatened necrosis involving the tail of the pancreas. 2. Mild hepatic steatosis. 3. 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 Electronically Signed   By: Fidela Salisbury M.D.   On: 05/07/2022 02:12     Subjective: - no chest pain, shortness of breath, no abdominal pain, nausea or vomiting.   Discharge Exam: BP 133/76 (BP Location: Right Arm)   Pulse 80   Temp 98.5 F (36.9 C)   Resp 18   Ht 6' (1.829 m)   [facility]  131.1 kg   SpO2 98%   BMI 39.20 kg/m   General: Pt is alert, awake, not in acute distress Cardiovascular: RRR, S1/S2 +, no rubs, no gallops Respiratory: CTA bilaterally, no wheezing, no rhonchi Abdominal: Soft, NT, ND, bowel sounds  + Extremities: no edema, no cyanosis    The results of significant diagnostics from this hospitalization (including imaging, microbiology, ancillary and laboratory) are listed below for reference.     Microbiology: Recent Results (from the past 240 hour(s))  MRSA Next Gen by PCR, Nasal     Status: None   Collection Time: 05/07/22  7:46 AM   Specimen: Nasal Mucosa; Nasal Swab  Result Value Ref Range Status   MRSA by PCR Next Gen NOT DETECTED NOT DETECTED Final    Comment: (NOTE) The GeneXpert MRSA Assay (FDA approved for NASAL specimens only), is one component of a comprehensive MRSA colonization surveillance program. It is not intended to diagnose MRSA infection nor to guide or monitor treatment for MRSA infections. Test performance is not FDA approved in patients less than 24 years old. Performed at [facility], [place] [address] [place], [place] [address]      Labs: Basic Metabolic Panel: Recent Labs  Lab 05/07/22 0802 05/08/22 0456 05/09/22 0858 05/09/22 1840 05/10/22 0313 05/11/22 0749 05/12/22 0630 05/13/22 0413  NA 133* 132*   < > 133* 130* 132* 132* 130*  K 3.5 4.1   < > 3.7 3.6 3.4* 3.4* 4.1  CL 105 103   < > 96* 99 95* 97* 98  CO2 16* 17*   < > 23 21* 21* 25 25  GLUCOSE 212* 152*   < > 134* 147* 80 92 230*  BUN 12 9   < > <5* <5* <5* <5* 7  CREATININE 1.92* 1.34*   < > 0.94 0.97 0.86 0.88 0.90  CALCIUM 8.6* 7.8*   < > 8.6* 8.5* 8.5* 8.6* 8.6*  MG 2.3 2.2  --   --   --   --  2.2 2.0  PHOS 1.9* 2.5  --   --   --   --   --   --    < > = values in this interval not displayed.   Liver Function Tests: Recent Labs  Lab 05/06/22 2118 05/07/22 0802 05/08/22 0456 05/12/22 0630 05/13/22 0413  AST 42* 46* 52* 44* 37  ALT 41 34 27 35 30  ALKPHOS 182* 96 66 68 74  BILITOT 0.7 0.7 1.5* 0.8 0.9  PROT 8.5* 7.1 5.7* 7.1 6.6  ALBUMIN 4.8 3.9 3.1* 3.2* 2.7*   CBC: Recent Labs  Lab 05/06/22 2118 05/07/22 0802 05/08/22 0456 05/12/22 0630  05/13/22 0413  WBC 12.0* 12.0* 9.8 8.4 8.1  HGB 16.9 16.4 14.4 13.6 11.9*  HCT 47.7 46.1 44.0 43.6 37.7*  MCV 83.2 85.7 87.8 89.7 88.1  PLT 246 215 228 201 276   CBG: Recent Labs  Lab 05/12/22 1130 05/12/22 1634 05/12/22 2104 05/13/22 0719 05/13/22 1127  GLUCAP 85 149* 210* 215* 199*   Hgb A1c Recent Labs    05/11/22 0446  HGBA1C 14.6*   Lipid Profile Recent Labs    05/12/22 1701 05/13/22 0413  TRIG 492* 520*   Thyroid function studies No results for input(s): "TSH", "T4TOTAL", "T3FREE", "THYROIDAB" in the last 72 hours.  Invalid input(s): "FREET3" Urinalysis    Component Value Date/Time   COLORURINE YELLOW 05/06/2022 2118   APPEARANCEUR CLEAR 05/06/2022 2118   LABSPEC 1.038 (H) 05/06/2022 2118  PHURINE 5.5 05/06/2022 2118   GLUCOSEU >1,000 (A) 05/06/2022 2118   HGBUR SMALL (A) 05/06/2022 2118   BILIRUBINUR NEGATIVE 05/06/2022 2118   KETONESUR 15 (A) 05/06/2022 2118   PROTEINUR 30 (A) 05/06/2022 2118   NITRITE NEGATIVE 05/06/2022 2118   LEUKOCYTESUR NEGATIVE 05/06/2022 2118    FURTHER DISCHARGE INSTRUCTIONS:   Get Medicines reviewed and adjusted: Please take all your medications with you for your next visit with your Primary MD   Laboratory/radiological data: Please request your Primary MD to go over all hospital tests and procedure/radiological results at the follow up, please ask your Primary MD to get all Hospital records sent to his/her office.   In some cases, they will be blood work, cultures and biopsy results pending at the time of your discharge. Please request that your primary care M.D. goes through all the records of your hospital data and follows up on these results.   Also Note the following: If you experience worsening of your admission symptoms, develop shortness of breath, life threatening emergency, suicidal or homicidal thoughts you must seek medical attention immediately by calling 911 or calling your MD immediately  if symptoms less  severe.   You must read complete instructions/literature along with all the possible adverse reactions/side effects for all the Medicines you take and that have been prescribed to you. Take any new Medicines after you have completely understood and accpet all the possible adverse reactions/side effects.    Do not drive when taking Pain medications or sleeping medications (Benzodaizepines)   Do not take more than prescribed Pain, Sleep and Anxiety Medications. It is not advisable to combine anxiety,sleep and pain medications without talking with your primary care practitioner   Special Instructions: If you have smoked or chewed Tobacco  in the last 2 yrs please stop smoking, stop any regular Alcohol  and or any Recreational drug use.   Wear Seat belts while driving.   Please note: You were cared for by a hospitalist during your hospital stay. Once you are discharged, your primary care physician will handle any further medical issues. Please note that NO REFILLS for any discharge medications will be authorized once you are discharged, as it is imperative that you return to your primary care physician (or establish a relationship with a primary care physician if you do not have one) for your post hospital discharge needs so that they can reassess your need for medications and monitor your lab values.  Time coordinating discharge: 35 minutes  SIGNED:  Marzetta Board, MD, PhD 05/13/2022, 12:48 PM

## 2022-05-13 NOTE — Discharge Instructions (Signed)
Follow with Tomasa Hose, NP in 5-7 days  Please get a complete blood count and chemistry panel checked by your Primary MD at your next visit, and again as instructed by your Primary MD. Please get your medications reviewed and adjusted by your Primary MD.  Please request your Primary MD to go over all Hospital Tests and Procedure/Radiological results at the follow up, please get all Hospital records sent to your Prim MD by signing hospital release before you go home.  In some cases, there will be blood work, cultures and biopsy results pending at the time of your discharge. Please request that your primary care M.D. goes through all the records of your hospital data and follows up on these results.  If you had Pneumonia of Lung problems at the Hospital: Please get a 2 view Chest X ray done in 6-8 weeks after hospital discharge or sooner if instructed by your Primary MD.  If you have Congestive Heart Failure: Please call your Cardiologist or Primary MD anytime you have any of the following symptoms:  1) 3 pound weight gain in 24 hours or 5 pounds in 1 week  2) shortness of breath, with or without a dry hacking cough  3) swelling in the hands, feet or stomach  4) if you have to sleep on extra pillows at night in order to breathe  Follow cardiac low salt diet and 1.5 lit/day fluid restriction.  If you have diabetes Accuchecks 4 times/day, Once in AM empty stomach and then before each meal. Log in all results and show them to your primary doctor at your next visit. If any glucose reading is under 80 or above 300 call your primary MD immediately.  If you have Seizure/Convulsions/Epilepsy: Please do not drive, operate heavy machinery, participate in activities at heights or participate in high speed sports until you have seen by Primary MD or a Neurologist and advised to do so again. Per [facility] statutes, patients with seizures are not allowed to drive until they have been  seizure-free for six months.  Use caution when using heavy equipment or power tools. Avoid working on ladders or at heights. Take showers instead of baths. Ensure the water temperature is not too high on the home water heater. Do not go swimming alone. Do not lock yourself in a room alone (i.e. bathroom). When caring for infants or small children, sit down when holding, feeding, or changing them to minimize risk of injury to the child in the event you have a seizure. Maintain good sleep hygiene. Avoid alcohol.   If you had Gastrointestinal Bleeding: Please ask your Primary MD to check a complete blood count within one week of discharge or at your next visit. Your endoscopic/colonoscopic biopsies that are pending at the time of discharge, will also need to followed by your Primary MD.  Get Medicines reviewed and adjusted. Please take all your medications with you for your next visit with your Primary MD  Please request your Primary MD to go over all hospital tests and procedure/radiological results at the follow up, please ask your Primary MD to get all Hospital records sent to his/her office.  If you experience worsening of your admission symptoms, develop shortness of breath, life threatening emergency, suicidal or homicidal thoughts you must seek medical attention immediately by calling 911 or calling your MD immediately  if symptoms less severe.  You must read complete instructions/literature along with all the possible adverse reactions/side effects for all the Medicines you  take and that have been prescribed to you. Take any new Medicines after you have completely understood and accpet all the possible adverse reactions/side effects.   Do not drive or operate heavy machinery when taking Pain medications.   Do not take more than prescribed Pain, Sleep and Anxiety Medications  Special Instructions: If you have smoked or chewed Tobacco  in the last 2 yrs please stop smoking, stop any regular  Alcohol  and or any Recreational drug use.  Wear Seat belts while driving.  Please note You were cared for by a hospitalist during your hospital stay. If you have any questions about your discharge medications or the care you received while you were in the hospital after you are discharged, you can call the unit and asked to speak with the hospitalist on call if the hospitalist that took care of you is not available. Once you are discharged, your primary care physician will handle any further medical issues. Please note that NO REFILLS for any discharge medications will be authorized once you are discharged, as it is imperative that you return to your primary care physician (or establish a relationship with a primary care physician if you do not have one) for your aftercare needs so that they can reassess your need for medications and monitor your lab values.  You can reach the hospitalist office at phone [phone] or fax [phone]   If you do not have a primary care physician, you can call [phone] for a physician referral.  Activity: As tolerated with Full fall precautions use walker/cane & assistance as needed    Diet: low fat, low sodium, diabetic  Disposition Home

## 2022-07-16 ENCOUNTER — Encounter (HOSPITAL_BASED_OUTPATIENT_CLINIC_OR_DEPARTMENT_OTHER): Payer: Self-pay | Admitting: Emergency Medicine

## 2022-07-16 ENCOUNTER — Other Ambulatory Visit: Payer: Self-pay

## 2022-07-16 ENCOUNTER — Emergency Department (HOSPITAL_BASED_OUTPATIENT_CLINIC_OR_DEPARTMENT_OTHER)
Admission: EM | Admit: 2022-07-16 | Discharge: 2022-07-16 | Disposition: A | Discharge status: Home / Self Care | Payer: Medicaid Other | Attending: Emergency Medicine | Admitting: Emergency Medicine

## 2022-07-16 DIAGNOSIS — R197 Diarrhea, unspecified: Secondary | ICD-10-CM

## 2022-07-16 DIAGNOSIS — R110 Nausea: Secondary | ICD-10-CM

## 2022-07-16 DIAGNOSIS — E119 Type 2 diabetes mellitus without complications: Secondary | ICD-10-CM | POA: Insufficient documentation

## 2022-07-16 DIAGNOSIS — Z79899 Other long term (current) drug therapy: Secondary | ICD-10-CM | POA: Insufficient documentation

## 2022-07-16 DIAGNOSIS — R109 Unspecified abdominal pain: Secondary | ICD-10-CM | POA: Insufficient documentation

## 2022-07-16 DIAGNOSIS — Z794 Long term (current) use of insulin: Secondary | ICD-10-CM | POA: Diagnosis not present

## 2022-07-16 DIAGNOSIS — Z7984 Long term (current) use of oral hypoglycemic drugs: Secondary | ICD-10-CM | POA: Diagnosis not present

## 2022-07-16 LAB — URINALYSIS, ROUTINE W REFLEX MICROSCOPIC
Bacteria, UA: NONE SEEN
Bilirubin Urine: NEGATIVE
Glucose, UA: NEGATIVE mg/dL
Hgb urine dipstick: NEGATIVE
Ketones, ur: NEGATIVE mg/dL
Leukocytes,Ua: NEGATIVE
Nitrite: NEGATIVE
Protein, ur: 30 mg/dL — AB
Specific Gravity, Urine: 1.04 — ABNORMAL HIGH (ref 1.005–1.030)
pH: 5.5 (ref 5.0–8.0)

## 2022-07-16 LAB — COMPREHENSIVE METABOLIC PANEL
ALT: 26 U/L (ref 0–44)
AST: 25 U/L (ref 15–41)
Albumin: 4.6 g/dL (ref 3.5–5.0)
Alkaline Phosphatase: 85 U/L (ref 38–126)
Anion gap: 9 (ref 5–15)
BUN: 10 mg/dL (ref 6–20)
CO2: 22 mmol/L (ref 22–32)
Calcium: 9.5 mg/dL (ref 8.9–10.3)
Chloride: 105 mmol/L (ref 98–111)
Creatinine, Ser: 1.04 mg/dL (ref 0.61–1.24)
GFR, Estimated: >60 mL/min (ref >60)
Glucose, Bld: 141 mg/dL — ABNORMAL HIGH (ref 70–99)
Potassium: 3.5 mmol/L (ref 3.5–5.1)
Sodium: 136 mmol/L (ref 135–145)
Total Bilirubin: 0.6 mg/dL (ref 0.3–1.2)
Total Protein: 8.3 g/dL — ABNORMAL HIGH (ref 6.5–8.1)

## 2022-07-16 LAB — CBC
HCT: 43.6 % (ref 39.0–52.0)
Hemoglobin: 14.2 g/dL (ref 13.0–17.0)
MCH: 28 pg (ref 26.0–34.0)
MCHC: 32.6 g/dL (ref 30.0–36.0)
MCV: 86 fL (ref 80.0–100.0)
Platelets: 289 10*3/uL (ref 150–400)
RBC: 5.07 MIL/uL (ref 4.22–5.81)
RDW: 13.6 % (ref 11.5–15.5)
WBC: 7.5 10*3/uL (ref 4.0–10.5)
nRBC: 0 % (ref 0.0–0.2)

## 2022-07-16 LAB — LIPASE, BLOOD: Lipase: 21 U/L (ref 11–51)

## 2022-07-16 MED ORDER — ONDANSETRON 4 MG PO TBDP: ORAL_TABLET | ORAL | 0 refills | Status: DC

## 2022-07-16 MED ORDER — ONDANSETRON HCL 4 MG/2ML IJ SOLN
4.0000 mg | Freq: Once | INTRAMUSCULAR | Status: AC
  Administered 2022-07-16: 4 mg via INTRAVENOUS
  Filled 2022-07-16: qty 2

## 2022-07-16 NOTE — ED Provider Notes (Signed)
[place] EMERGENCY DEPARTMENT AT [facility] Provider Note   CSN: [id number] Arrival date & time: 07/16/22  9562     History  Chief Complaint  Patient presents with   Abdominal Pain    Douglas [name]. is a 37 y.o. male.  Patient presents with abdominal cramping and intermittent diarrhea.  Burp small like boiled eggs.  Patient had pancreatitis in April and had 10 pounds weight loss.  Patient has diabetes on insulin.  Currently no significant pain and this does not feel similar to his pancreatitis.  No recent travel.  Patient did eat different foods over holiday weekend.       Home Medications Prior to Admission medications   Medication Sig Start Date End Date Taking? Authorizing Provider  lisinopril (ZESTRIL) 10 MG tablet Take 10 mg by mouth daily. 06/08/22  Yes [provider]  ondansetron (ZOFRAN-ODT) 4 MG disintegrating tablet 4mg  ODT q6 hours prn nausea/vomit 07/16/22  Yes Blane Ohara, MD  atorvastatin (LIPITOR) 80 MG tablet Take 1 tablet (80 mg total) by mouth daily at 6 PM. 05/13/22   Gherghe, Daylene Katayama, MD  blood glucose meter kit and supplies KIT Dispense based on patient and insurance preference. Use up to four times daily as directed. (FOR ICD-9 250.00, 250.01). 09/01/19   Azucena Fallen, MD  blood glucose meter kit and supplies Dispense based on patient and insurance preference. Use up to four times daily as directed. (FOR ICD-10 E10.9, E11.9). 09/22/17   Mikhail, Nita Sells, DO  Continuous Blood Gluc Sensor (FREESTYLE LIBRE 3 SENSOR) MISC 1 each by Does not apply route every 14 (fourteen) days. Place 1 sensor on the skin every 14 days. Use to check glucose continuously 05/13/22   Leatha Gilding, MD  fenofibrate 160 MG tablet Take 1 tablet (160 mg total) by mouth daily. 05/13/22   Leatha Gilding, MD  insulin glargine (LANTUS) 100 UNIT/ML Solostar Pen Inject 15 Units into the skin daily. 05/13/22   Leatha Gilding, MD  Insulin Pen Needle  (TECHLITE PEN NEEDLES) 32G X 4 MM MISC Use as directed to inject insulin. 05/13/22   Leatha Gilding, MD  metFORMIN (GLUCOPHAGE) 500 MG tablet Take 1-2 tablets (500-1,000 mg total) by mouth 2 (two) times daily with a meal. Take 2 tablets in the AM and 1 tablet in the PM 05/13/22 06/22/22  Leatha Gilding, MD  metoprolol succinate (TOPROL-XL) 50 MG 24 hr tablet Take 1 tablet (50 mg total) by mouth daily. 09/02/19   Azucena Fallen, MD  pantoprazole (PROTONIX) 40 MG tablet Take 40 mg by mouth daily. 06/26/19   [provider]      Allergies    Patient has no known allergies.    Review of Systems   Review of Systems  Constitutional:  Negative for chills and fever.  HENT:  Negative for congestion.   Eyes:  Negative for visual disturbance.  Respiratory:  Negative for shortness of breath.   Cardiovascular:  Negative for chest pain.  Gastrointestinal:  Positive for diarrhea, nausea and vomiting. Negative for abdominal pain and blood in stool.  Genitourinary:  Negative for dysuria and flank pain.  Musculoskeletal:  Negative for back pain, neck pain and neck stiffness.  Skin:  Negative for rash.  Neurological:  Negative for light-headedness and headaches.    Physical Exam Updated Vital Signs BP (!) 145/97 (BP Location: Right Arm)   Pulse 80   Temp 97.8 F (36.6 C) (Oral)   Resp 17  Ht 6' (1.829 m)   Wt 122.5 kg   SpO2 97%   BMI 36.62 kg/m  Physical Exam Vitals and nursing note reviewed.  Constitutional:      General: He is not in acute distress.    Appearance: He is well-developed.  HENT:     Head: Normocephalic and atraumatic.     Mouth/Throat:     Mouth: Mucous membranes are moist.  Eyes:     General:        Right eye: No discharge.        Left eye: No discharge.     Conjunctiva/sclera: Conjunctivae normal.  Neck:     Trachea: No tracheal deviation.  Cardiovascular:     Rate and Rhythm: Normal rate and regular rhythm.     Heart sounds: No murmur  heard. Pulmonary:     Effort: Pulmonary effort is normal.     Breath sounds: Normal breath sounds.  Abdominal:     General: There is no distension.     Palpations: Abdomen is soft.     Tenderness: There is no abdominal tenderness. There is no guarding.  Musculoskeletal:     Cervical back: Normal range of motion and neck supple. No rigidity.  Skin:    General: Skin is warm.     Capillary Refill: Capillary refill takes less than 2 seconds.     Findings: No rash.  Neurological:     General: No focal deficit present.     Mental Status: He is alert.     Cranial Nerves: No cranial nerve deficit.  Psychiatric:        Mood and Affect: Mood normal.     ED Results / Procedures / Treatments   Labs (all labs ordered are listed, but only abnormal results are displayed) Labs Reviewed  COMPREHENSIVE METABOLIC PANEL - Abnormal; Notable for the following components:      Result Value   Glucose, Bld 141 (*)    Total Protein 8.3 (*)    All other components within normal limits  LIPASE, BLOOD  CBC  URINALYSIS, ROUTINE W REFLEX MICROSCOPIC    EKG None  Radiology No results found.  Procedures Procedures    Medications Ordered in ED Medications  ondansetron (ZOFRAN) injection 4 mg (4 mg Intravenous Given 07/16/22 0915)    ED Course/ Medical Decision Making/ A&P                             Medical Decision Making Amount and/or Complexity of Data Reviewed Labs: ordered.  Risk Prescription drug management.   Patient presents with mild abdominal cramping diarrhea and nausea.  Discussed likely toxin/viral mediated given no abdominal tenderness or pain at this time to suggest appendicitis/pancreatitis/gallbladder related.  Other differentials include diabetes related however glucose minimally elevated 140 no anion gap.  Afebrile in the ED.  Abdominal exam reassuring.  Zofran given in the ER, oral fluids given.  Patient can tolerate oral fluids without difficulty.  Discussed Zofran  as needed at home supportive care and reasons to return patient comfortable this plan.        Final Clinical Impression(s) / ED Diagnoses Final diagnoses:  Diarrhea, unspecified type  Nausea    Rx / DC Orders ED Discharge Orders          Ordered    ondansetron (ZOFRAN-ODT) 4 MG disintegrating tablet        07/16/22 0930  Blane Ohara, MD 07/16/22 [phone]

## 2022-07-16 NOTE — ED Triage Notes (Signed)
Pt came from home, POV. C/o abdominal pain since Sunday with diarrhea. States burps "smell like boiled eggs". Recently here for pancreatitis in April. 10 lb weight loss since last week as well.

## 2022-07-16 NOTE — Discharge Instructions (Signed)
Use Zofran every 6 hours needed for nausea and vomiting. Return for uncontrolled pain or new concerns. Use Tylenol every 4 hours and Motrin every 6 hours needed for pain or fever.

## 2022-09-03 ENCOUNTER — Ambulatory Visit: Payer: Medicaid Other | Attending: Internal Medicine | Admitting: Internal Medicine

## 2022-10-04 ENCOUNTER — Emergency Department (HOSPITAL_BASED_OUTPATIENT_CLINIC_OR_DEPARTMENT_OTHER)
Admission: EM | Admit: 2022-10-04 | Discharge: 2022-10-05 | Disposition: A | Discharge status: Home / Self Care | Payer: Medicaid Other | Attending: Emergency Medicine | Admitting: Emergency Medicine

## 2022-10-04 ENCOUNTER — Encounter (HOSPITAL_BASED_OUTPATIENT_CLINIC_OR_DEPARTMENT_OTHER): Payer: Self-pay

## 2022-10-04 DIAGNOSIS — Z794 Long term (current) use of insulin: Secondary | ICD-10-CM | POA: Insufficient documentation

## 2022-10-04 DIAGNOSIS — I10 Essential (primary) hypertension: Secondary | ICD-10-CM | POA: Diagnosis not present

## 2022-10-04 DIAGNOSIS — R519 Headache, unspecified: Secondary | ICD-10-CM | POA: Diagnosis present

## 2022-10-04 DIAGNOSIS — E119 Type 2 diabetes mellitus without complications: Secondary | ICD-10-CM | POA: Diagnosis not present

## 2022-10-04 DIAGNOSIS — U071 COVID-19: Secondary | ICD-10-CM

## 2022-10-04 DIAGNOSIS — Z79899 Other long term (current) drug therapy: Secondary | ICD-10-CM | POA: Diagnosis not present

## 2022-10-04 LAB — CBC WITH DIFFERENTIAL/PLATELET
Abs Immature Granulocytes: 0.01 10*3/uL (ref 0.00–0.07)
Basophils Absolute: 0 10*3/uL (ref 0.0–0.1)
Basophils Relative: 1 %
Eosinophils Absolute: 0.1 10*3/uL (ref 0.0–0.5)
Eosinophils Relative: 1 %
HCT: 40.4 % (ref 39.0–52.0)
Hemoglobin: 13.2 g/dL (ref 13.0–17.0)
Immature Granulocytes: 0 %
Lymphocytes Relative: 25 %
Lymphs Abs: 1.1 10*3/uL (ref 0.7–4.0)
MCH: 28 pg (ref 26.0–34.0)
MCHC: 32.7 g/dL (ref 30.0–36.0)
MCV: 85.8 fL (ref 80.0–100.0)
Monocytes Absolute: 0.8 10*3/uL (ref 0.1–1.0)
Monocytes Relative: 18 %
Neutro Abs: 2.3 10*3/uL (ref 1.7–7.7)
Neutrophils Relative %: 55 %
Platelets: 200 10*3/uL (ref 150–400)
RBC: 4.71 MIL/uL (ref 4.22–5.81)
RDW: 12.7 % (ref 11.5–15.5)
WBC: 4.3 10*3/uL (ref 4.0–10.5)
nRBC: 0 % (ref 0.0–0.2)

## 2022-10-04 LAB — URINALYSIS, ROUTINE W REFLEX MICROSCOPIC
Bacteria, UA: NONE SEEN
Bilirubin Urine: NEGATIVE
Glucose, UA: >1000 mg/dL — AB
Hgb urine dipstick: NEGATIVE
Ketones, ur: NEGATIVE mg/dL
Leukocytes,Ua: NEGATIVE
Nitrite: NEGATIVE
Protein, ur: NEGATIVE mg/dL
Specific Gravity, Urine: 1.04 — ABNORMAL HIGH (ref 1.005–1.030)
pH: 6 (ref 5.0–8.0)

## 2022-10-04 LAB — CBG MONITORING, ED: Glucose-Capillary: 307 mg/dL — ABNORMAL HIGH (ref 70–99)

## 2022-10-04 MED ORDER — SODIUM CHLORIDE 0.9 % IV BOLUS (SEPSIS)
1000.0000 mL | Freq: Once | INTRAVENOUS | Status: AC
  Administered 2022-10-04: 1000 mL via INTRAVENOUS

## 2022-10-04 MED ORDER — ONDANSETRON 4 MG PO TBDP
4.0000 mg | ORAL_TABLET | Freq: Once | ORAL | Status: AC
  Administered 2022-10-04: 4 mg via ORAL
  Filled 2022-10-04: qty 1

## 2022-10-04 MED ORDER — SODIUM CHLORIDE 0.9 % IV SOLN
1000.0000 mL | INTRAVENOUS | Status: DC
  Administered 2022-10-05: 1000 mL via INTRAVENOUS

## 2022-10-04 NOTE — ED Triage Notes (Signed)
Pt states he hasn't felt well since returning from mission trip to [place]- at home covid test was positive. Bodyaches, HA, nausea, diarrhea, chills. Reports concern for blood sugar r/t inability to tolerate PO, "just haven't felt well."  Nyquil approx ago

## 2022-10-05 LAB — COMPREHENSIVE METABOLIC PANEL
ALT: 22 U/L (ref 0–44)
AST: 22 U/L (ref 15–41)
Albumin: 3.9 g/dL (ref 3.5–5.0)
Alkaline Phosphatase: 76 U/L (ref 38–126)
Anion gap: 7 (ref 5–15)
BUN: 10 mg/dL (ref 6–20)
CO2: 24 mmol/L (ref 22–32)
Calcium: 8.9 mg/dL (ref 8.9–10.3)
Chloride: 103 mmol/L (ref 98–111)
Creatinine, Ser: 0.98 mg/dL (ref 0.61–1.24)
GFR, Estimated: >60 mL/min (ref >60)
Glucose, Bld: 335 mg/dL — ABNORMAL HIGH (ref 70–99)
Potassium: 3.5 mmol/L (ref 3.5–5.1)
Sodium: 134 mmol/L — ABNORMAL LOW (ref 135–145)
Total Bilirubin: 0.3 mg/dL (ref 0.3–1.2)
Total Protein: 6.9 g/dL (ref 6.5–8.1)

## 2022-10-05 LAB — RESP PANEL BY RT-PCR (RSV, FLU A&B, COVID)  RVPGX2
Influenza A by PCR: NEGATIVE
Influenza B by PCR: NEGATIVE
Resp Syncytial Virus by PCR: NEGATIVE
SARS Coronavirus 2 by RT PCR: POSITIVE — AB

## 2022-10-05 MED ORDER — MOLNUPIRAVIR 200 MG PO CAPS: 4.0000 | ORAL_CAPSULE | Freq: Two times a day (BID) | ORAL | 0 refills | Status: AC

## 2022-10-05 NOTE — ED Provider Notes (Signed)
[place] EMERGENCY DEPARTMENT AT [facility] Provider Note  CSN: [id number] Arrival date & time: 10/04/22 2250  Chief Complaint(s) Covid Positive  HPI [name]. is a 37 y.o. male with a past medical history listed below including hypertension, hyperlipidemia, hypertriglyceridemia related pancreatitis and type 2 diabetes who presents to the emergency department for generalized malaise for the past 2 days.  He reports recent travel to [place] and returning yesterday.  Reports that he had a positive at-home COVID test.  Taken over-the-counter medication.  Endorsing myalgias, headache, nausea and diarrhea.  No emesis.  No coughing.  No chest pain.  No abdominal pain.  No other physical complaints.  Did report that he has not taken his metformin due to feeling ill but did report taking his insulin today.  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   Abnormal ECG 09/05/2019   Atrial fibrillation with RVR (HCC) 08/26/2019   Type 2 diabetes mellitus without complication (HCC) 08/25/2019   Hyperlipidemia 08/25/2019   History of pancreatitis 11/28/2017   Hypertriglyceridemia 11/28/2017   Peripancreatic fluid collection 11/28/2017   Rectal bleeding 11/28/2017   Acute pancreatitis 09/16/2017   Abdominal pain 09/16/2017   Leukocytosis 09/16/2017   Hyponatremia 09/16/2017   Abnormal LFTs 09/16/2017   Obesity, Class III, BMI 40-49.9 (morbid obesity) (HCC) 09/16/2017   Hypertension 09/16/2017   Pancreatitis 09/16/2017    Acute pancreatitis without infection or necrosis 09/16/2017   Metabolic syndrome 01/01/2017   Home Medication(s) Prior to Admission medications   Medication Sig Start Date End Date Taking? Authorizing Provider  molnupiravir EUA (LAGEVRIO) 200 MG CAPS capsule Take 4 capsules (800 mg total) by mouth 2 (two) times daily for 5 days. 10/05/22 10/10/22 Yes Christopher Hink, Amadeo Garnet, MD  atorvastatin (LIPITOR) 80 MG tablet Take 1 tablet (80 mg total) by mouth daily at 6 PM. 05/13/22   Gherghe, Daylene Katayama, MD  blood glucose meter kit and supplies KIT Dispense based on patient and insurance preference. Use up to four times daily as directed. (FOR ICD-9 250.00, 250.01). 09/01/19   Azucena Fallen, MD  blood glucose meter kit and supplies Dispense based on patient and insurance preference. Use up to four times daily as directed. (FOR ICD-10 E10.9, E11.9). 09/22/17   Mikhail, Nita Sells, DO  Continuous Blood Gluc Sensor (FREESTYLE LIBRE 3 SENSOR) MISC 1 each by Does not apply route every 14 (fourteen) days. Place 1 sensor on the skin every 14 days. Use to check glucose continuously 05/13/22   Leatha Gilding, MD  fenofibrate 160 MG tablet Take 1 tablet (160 mg total) by mouth daily. 05/13/22   Leatha Gilding, MD  insulin glargine (LANTUS) 100 UNIT/ML Solostar Pen Inject 15 Units into the skin daily. 05/13/22   Leatha Gilding, MD  Insulin Pen Needle (TECHLITE PEN NEEDLES) 32G X 4 MM MISC Use as directed to inject insulin. 05/13/22   Leatha Gilding, MD  lisinopril (ZESTRIL) 10 MG tablet Take 10 mg by mouth daily. 06/08/22   [provider]  metFORMIN (  GLUCOPHAGE) 500 MG tablet Take 1-2 tablets (500-1,000 mg total) by mouth 2 (two) times daily with a meal. Take 2 tablets in the AM and 1 tablet in the PM 05/13/22 06/22/22  Leatha Gilding, MD  metoprolol succinate (TOPROL-XL) 50 MG 24 hr tablet Take 1 tablet (50 mg total) by mouth daily. 09/02/19   Azucena Fallen, MD  ondansetron (ZOFRAN-ODT) 4 MG  disintegrating tablet 4mg  ODT q6 hours prn nausea/vomit 07/16/22   Blane Ohara, MD  pantoprazole (PROTONIX) 40 MG tablet Take 40 mg by mouth daily. 06/26/19   [provider]                                                                                                                                    Allergies Patient has no known allergies.  Review of Systems Review of Systems As noted in HPI  Physical Exam Vital Signs  I have reviewed the triage vital signs BP (!) 126/95   Pulse 77   Temp 99.3 F (37.4 C)   Resp 16   SpO2 93%   Physical Exam Vitals reviewed.  Constitutional:      General: He is not in acute distress.    Appearance: He is well-developed. He is not diaphoretic.  HENT:     Head: Normocephalic and atraumatic.     Nose: Nose normal.  Eyes:     General: No scleral icterus.       Right eye: No discharge.        Left eye: No discharge.     Conjunctiva/sclera: Conjunctivae normal.     Pupils: Pupils are equal, round, and reactive to light.  Cardiovascular:     Rate and Rhythm: Normal rate and regular rhythm.     Heart sounds: No murmur heard.    No friction rub. No gallop.  Pulmonary:     Effort: Pulmonary effort is normal. No respiratory distress.     Breath sounds: No stridor.  Abdominal:     General: There is no distension.     Palpations: Abdomen is soft.     Tenderness: There is no abdominal tenderness.  Musculoskeletal:        General: No tenderness.     Cervical back: Normal range of motion and neck supple.  Skin:    General: Skin is warm and dry.     Findings: No erythema or rash.  Neurological:     Mental Status: He is alert and oriented to person, place, and time.     ED Results and Treatments Labs (all labs ordered are listed, but only abnormal results are displayed) Labs Reviewed  RESP PANEL BY RT-PCR (RSV, FLU A&B, COVID)  [id number] - Abnormal; Notable for the following components:      Result Value   SARS Coronavirus 2  by RT PCR POSITIVE (*)    All other components within normal limits  COMPREHENSIVE METABOLIC PANEL -  Abnormal; Notable for the following components:   Sodium 134 (*)    Glucose, Bld 335 (*)    All other components within normal limits  URINALYSIS, ROUTINE W REFLEX MICROSCOPIC - Abnormal; Notable for the following components:   Specific Gravity, Urine 1.040 (*)    Glucose, UA >1,000 (*)    All other components within normal limits  CBG MONITORING, ED - Abnormal; Notable for the following components:   Glucose-Capillary 307 (*)    All other components within normal limits  CBC WITH DIFFERENTIAL/PLATELET                                                                                                                         EKG  EKG Interpretation Date/Time:    Ventricular Rate:    PR Interval:    QRS Duration:    QT Interval:    QTC Calculation:   R Axis:      Text Interpretation:         Radiology No results found.  Medications Ordered in ED Medications  sodium chloride 0.9 % bolus 1,000 mL (0 mLs Intravenous Stopped 10/05/22 0043)    Followed by  0.9 %  sodium chloride infusion (0 mLs Intravenous Stopped 10/05/22 0121)  ondansetron (ZOFRAN-ODT) disintegrating tablet 4 mg (4 mg Oral Given 10/04/22 2322)   Procedures Procedures  (including critical care time) Medical Decision Making / ED Course   Medical Decision Making Amount and/or Complexity of Data Reviewed Labs: ordered. Decision-making details documented in ED Course.  Risk Prescription drug management.     Clinical Course as of 10/05/22 0146  Sun Oct 04, 2022  2326 Generalized malaise.  + at home COVID test. H/o DM.  CBG 307  Will get labs to assess for electrolyte/metabolic derangements.  [PC]  Mon Oct 05, 2022  0112 CBC without leukocytosis or anemia Metabolic panel without significant electrolyte derangement or renal sufficiency.  Hyperglycemia without DKA. COVID-positive.  Patient provided  with IV fluids. Will DC with molnupiravir given Lipitor use. [PC]    Clinical Course User Index [PC] Maxen Rowland, Amadeo Garnet, MD    Final Clinical Impression(s) / ED Diagnoses Final diagnoses:  COVID-19 virus infection   The patient appears reasonably screened and/or stabilized for discharge and I doubt any other medical condition or other [facility] requiring further screening, evaluation, or treatment in the ED at this time. I have discussed the findings, Dx and Tx plan with the patient/family who expressed understanding and agree(s) with the plan. Discharge instructions discussed at length. The patient/family was given strict return precautions who verbalized understanding of the instructions. No further questions at time of discharge.  Disposition: Discharge  Condition: Good  ED Discharge Orders          Ordered    molnupiravir EUA (LAGEVRIO) 200 MG CAPS capsule  2 times daily        10/05/22 0113             Follow Up: Izora Ribas,  Lina Sar, NP [address] [place] [address] [phone]  Call  to schedule an appointment for close follow up    This chart was dictated using voice recognition software.  Despite best efforts to proofread,  errors can occur which can change the documentation meaning.    Nira Conn, MD 10/05/22 [phone]

## 2023-07-27 ENCOUNTER — Other Ambulatory Visit: Payer: Self-pay

## 2023-07-27 ENCOUNTER — Encounter (HOSPITAL_BASED_OUTPATIENT_CLINIC_OR_DEPARTMENT_OTHER): Payer: Self-pay

## 2023-07-27 ENCOUNTER — Observation Stay (HOSPITAL_BASED_OUTPATIENT_CLINIC_OR_DEPARTMENT_OTHER)
Admission: EM | Admit: 2023-07-27 | Discharge: 2023-07-29 | Disposition: A | Discharge status: Home / Self Care | Attending: Internal Medicine | Admitting: Internal Medicine

## 2023-07-27 DIAGNOSIS — E1010 Type 1 diabetes mellitus with ketoacidosis without coma: Secondary | ICD-10-CM | POA: Diagnosis present

## 2023-07-27 DIAGNOSIS — E871 Hypo-osmolality and hyponatremia: Secondary | ICD-10-CM | POA: Diagnosis not present

## 2023-07-27 DIAGNOSIS — E66811 Obesity, class 1: Secondary | ICD-10-CM | POA: Insufficient documentation

## 2023-07-27 DIAGNOSIS — E441 Mild protein-calorie malnutrition: Secondary | ICD-10-CM | POA: Diagnosis not present

## 2023-07-27 DIAGNOSIS — Z79899 Other long term (current) drug therapy: Secondary | ICD-10-CM | POA: Diagnosis not present

## 2023-07-27 DIAGNOSIS — F1090 Alcohol use, unspecified, uncomplicated: Secondary | ICD-10-CM | POA: Insufficient documentation

## 2023-07-27 DIAGNOSIS — E1165 Type 2 diabetes mellitus with hyperglycemia: Secondary | ICD-10-CM | POA: Diagnosis not present

## 2023-07-27 DIAGNOSIS — I10 Essential (primary) hypertension: Secondary | ICD-10-CM | POA: Insufficient documentation

## 2023-07-27 DIAGNOSIS — E1110 Type 2 diabetes mellitus with ketoacidosis without coma: Principal | ICD-10-CM | POA: Insufficient documentation

## 2023-07-27 DIAGNOSIS — E785 Hyperlipidemia, unspecified: Secondary | ICD-10-CM | POA: Insufficient documentation

## 2023-07-27 DIAGNOSIS — Z6835 Body mass index (BMI) 35.0-35.9, adult: Secondary | ICD-10-CM | POA: Insufficient documentation

## 2023-07-27 DIAGNOSIS — Z87891 Personal history of nicotine dependence: Secondary | ICD-10-CM | POA: Diagnosis not present

## 2023-07-27 DIAGNOSIS — R7989 Other specified abnormal findings of blood chemistry: Secondary | ICD-10-CM | POA: Diagnosis present

## 2023-07-27 DIAGNOSIS — R739 Hyperglycemia, unspecified: Secondary | ICD-10-CM | POA: Diagnosis present

## 2023-07-27 LAB — URINALYSIS, ROUTINE W REFLEX MICROSCOPIC
Bacteria, UA: NONE SEEN
Bilirubin Urine: NEGATIVE
Glucose, UA: >1000 mg/dL — AB
Hgb urine dipstick: NEGATIVE
Ketones, ur: 40 mg/dL — AB
Leukocytes,Ua: NEGATIVE
Nitrite: NEGATIVE
Specific Gravity, Urine: 1.04 — ABNORMAL HIGH (ref 1.005–1.030)
pH: 6.5 (ref 5.0–8.0)

## 2023-07-27 LAB — I-STAT VENOUS BLOOD GAS, ED
Acid-base deficit: 2 mmol/L (ref 0.0–2.0)
Bicarbonate: 24.4 mmol/L (ref 20.0–28.0)
Calcium, Ion: 1.29 mmol/L (ref 1.15–1.40)
HCT: 45 % (ref 39.0–52.0)
Hemoglobin: 15.3 g/dL (ref 13.0–17.0)
O2 Saturation: 78 %
Potassium: 4.2 mmol/L (ref 3.5–5.1)
Sodium: 128 mmol/L — ABNORMAL LOW (ref 135–145)
TCO2: 26 mmol/L (ref 22–32)
pCO2, Ven: 45.9 mmHg (ref 44–60)
pH, Ven: 7.333 (ref 7.25–7.43)
pO2, Ven: 46 mmHg — ABNORMAL HIGH (ref 32–45)

## 2023-07-27 LAB — CBC
HCT: 42.6 % (ref 39.0–52.0)
Hemoglobin: 15.6 g/dL (ref 13.0–17.0)
MCH: 30.9 pg (ref 26.0–34.0)
MCHC: 36.6 g/dL — ABNORMAL HIGH (ref 30.0–36.0)
MCV: 84.4 fL (ref 80.0–100.0)
Platelets: 228 10*3/uL (ref 150–400)
RBC: 5.05 MIL/uL (ref 4.22–5.81)
RDW: 12.3 % (ref 11.5–15.5)
WBC: 6.1 10*3/uL (ref 4.0–10.5)
nRBC: 0 % (ref 0.0–0.2)

## 2023-07-27 LAB — BASIC METABOLIC PANEL WITH GFR
Anion gap: 18 — ABNORMAL HIGH (ref 5–15)
BUN: 11 mg/dL (ref 6–20)
CO2: 17 mmol/L — ABNORMAL LOW (ref 22–32)
Calcium: 9.6 mg/dL (ref 8.9–10.3)
Chloride: 89 mmol/L — ABNORMAL LOW (ref 98–111)
Creatinine, Ser: 1.17 mg/dL (ref 0.61–1.24)
GFR, Estimated: >60 mL/min (ref >60)
Glucose, Bld: 358 mg/dL — ABNORMAL HIGH (ref 70–99)
Potassium: 4.9 mmol/L (ref 3.5–5.1)
Sodium: 124 mmol/L — ABNORMAL LOW (ref 135–145)

## 2023-07-27 LAB — CBG MONITORING, ED
Glucose-Capillary: 355 mg/dL — ABNORMAL HIGH (ref 70–99)
Glucose-Capillary: 373 mg/dL — ABNORMAL HIGH (ref 70–99)

## 2023-07-27 MED ORDER — INSULIN REGULAR(HUMAN) IN NACL 100-0.9 UT/100ML-% IV SOLN
INTRAVENOUS | Status: DC
  Administered 2023-07-28: 15 [IU]/h via INTRAVENOUS
  Filled 2023-07-27: qty 100

## 2023-07-27 MED ORDER — LACTATED RINGERS IV SOLN: INTRAVENOUS | Status: AC

## 2023-07-27 MED ORDER — LACTATED RINGERS IV BOLUS
20.0000 mL/kg | Freq: Once | INTRAVENOUS | Status: AC
  Administered 2023-07-27: 2404 mL via INTRAVENOUS

## 2023-07-27 MED ORDER — DEXTROSE IN LACTATED RINGERS 5 % IV SOLN: INTRAVENOUS | Status: AC

## 2023-07-27 MED ORDER — POTASSIUM CHLORIDE 10 MEQ/100ML IV SOLN
10.0000 meq | INTRAVENOUS | Status: AC
  Administered 2023-07-28 (×2): 10 meq via INTRAVENOUS
  Filled 2023-07-27: qty 100

## 2023-07-27 MED ORDER — DEXTROSE 50 % IV SOLN: 0.0000 mL | INTRAVENOUS | Status: DC | PRN

## 2023-07-27 NOTE — ED Provider Notes (Signed)
 [place] EMERGENCY DEPARTMENT AT [facility] Provider Note  CSN: [id number] Arrival date & time: 07/27/23 1949  Chief Complaint(s) Hyperglycemia  HPI Douglas [name]. is a 38 y.o. male here for elevated cbgs. Has not taken his meds in 1 week.    The history is provided by the patient.    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   Abnormal ECG 09/05/2019   Atrial fibrillation with RVR (HCC) 08/26/2019   Type 2 diabetes mellitus without complication (HCC) 08/25/2019   Hyperlipidemia 08/25/2019   History of pancreatitis 11/28/2017   Hypertriglyceridemia 11/28/2017   Peripancreatic fluid collection 11/28/2017   Rectal bleeding 11/28/2017   Acute pancreatitis 09/16/2017   Abdominal pain 09/16/2017   Leukocytosis 09/16/2017   Hyponatremia 09/16/2017   Abnormal LFTs 09/16/2017   Obesity, Class III, BMI 40-49.9 (morbid obesity) 09/16/2017   Hypertension 09/16/2017   Pancreatitis 09/16/2017   Acute pancreatitis without infection or necrosis 09/16/2017   Metabolic syndrome 01/01/2017   Home Medication(s) Prior to Admission medications   Medication Sig Start Date End Date Taking? Authorizing Provider  atorvastatin  (LIPITOR) 80 MG tablet Take 1 tablet (80 mg total) by mouth daily at 6 PM. 05/13/22   Gherghe, Costin M, MD  blood glucose meter kit and supplies KIT Dispense based on patient and insurance preference. Use up to four times daily as directed. (FOR ICD-9 250.00, 250.01). 09/01/19   Haydee Lipa, MD  blood glucose  meter kit and supplies Dispense based on patient and insurance preference. Use up to four times daily as directed. (FOR ICD-10 E10.9, E11.9). 09/22/17   Mikhail, Maryann, DO  Continuous Blood Gluc Sensor (FREESTYLE LIBRE 3 SENSOR) MISC 1 each by Does not apply route every 14 (fourteen) days. Place 1 sensor on the skin every 14 days. Use to check glucose continuously 05/13/22   Osborn Blaze, MD  fenofibrate  160 MG tablet Take 1 tablet (160 mg total) by mouth daily. 05/13/22   Gherghe, Costin M, MD  insulin  glargine (LANTUS ) 100 UNIT/ML Solostar Pen Inject 15 Units into the skin daily. 05/13/22   Osborn Blaze, MD  Insulin  Pen Needle (TECHLITE PEN NEEDLES) 32G X 4 MM MISC Use as directed to inject insulin . 05/13/22   Gherghe, Costin M, MD  lisinopril (ZESTRIL) 10 MG tablet Take 10 mg by mouth daily. 06/08/22   [provider]  metFORMIN  (GLUCOPHAGE ) 500 MG tablet Take 1-2 tablets (500-1,000 mg total) by mouth 2 (two) times daily with a meal. Take 2 tablets in the AM and 1 tablet in the PM 05/13/22 06/22/22  Gherghe, Costin M, MD  metoprolol  succinate (TOPROL -XL) 50 MG 24 hr tablet Take 1 tablet (50 mg total) by mouth daily. 09/02/19   Haydee Lipa, MD  ondansetron  (ZOFRAN -ODT) 4 MG disintegrating tablet 4mg  ODT q6 hours prn nausea/vomit 07/16/22   Zavitz, Joshua, MD  pantoprazole  (PROTONIX ) 40 MG tablet Take 40 mg by mouth daily. 06/26/19   [provider]  Allergies Patient has no known allergies.  Review of Systems Review of Systems As noted in HPI  Physical Exam Vital Signs  I have reviewed the triage vital signs BP 128/88 (BP Location: Right Arm)   Pulse 98   Temp 98 F (36.7 C) (Temporal)   Resp 20   Ht 6' (1.829 m)   Wt 120.2 kg   SpO2 96%   BMI 35.94 kg/m   Physical Exam Vitals reviewed.  Constitutional:      General: He is not  in acute distress.    Appearance: He is well-developed. He is not diaphoretic.  HENT:     Head: Normocephalic and atraumatic.     Right Ear: External ear normal.     Left Ear: External ear normal.     Nose: Nose normal.     Mouth/Throat:     Mouth: Mucous membranes are moist.  Eyes:     General: No scleral icterus.    Conjunctiva/sclera: Conjunctivae normal.  Neck:     Trachea: Phonation normal.  Cardiovascular:     Rate and Rhythm: Normal rate and regular rhythm.  Pulmonary:     Effort: Pulmonary effort is normal. No respiratory distress.     Breath sounds: No stridor.  Abdominal:     General: There is no distension.     Tenderness: There is abdominal tenderness.  Musculoskeletal:        General: Normal range of motion.     Cervical back: Normal range of motion.  Neurological:     Mental Status: He is alert and oriented to person, place, and time.  Psychiatric:        Behavior: Behavior normal.     ED Results and Treatments Labs (all labs ordered are listed, but only abnormal results are displayed) Labs Reviewed  BASIC METABOLIC PANEL WITH GFR - Abnormal; Notable for the following components:      Result Value   Sodium 124 (*)    Chloride 89 (*)    CO2 17 (*)    Glucose, Bld 358 (*)    Anion gap 18 (*)    All other components within normal limits  CBC - Abnormal; Notable for the following components:   MCHC 36.6 (*)    All other components within normal limits  URINALYSIS, ROUTINE W REFLEX MICROSCOPIC - Abnormal; Notable for the following components:   Specific Gravity, Urine 1.040 (*)    Glucose, UA >1,000 (*)    Ketones, ur 40 (*)    Protein, ur TRACE (*)    All other components within normal limits  CBG MONITORING, ED - Abnormal; Notable for the following components:   Glucose-Capillary 355 (*)    All other components within normal limits  CBG MONITORING, ED - Abnormal; Notable for the following components:   Glucose-Capillary 373 (*)    All other  components within normal limits  I-STAT VENOUS BLOOD GAS, ED - Abnormal; Notable for the following components:   pO2, Ven 46 (*)    Sodium 128 (*)    All other components within normal limits  BETA-HYDROXYBUTYRIC ACID  BETA-HYDROXYBUTYRIC ACID  BETA-HYDROXYBUTYRIC ACID  BETA-HYDROXYBUTYRIC ACID  I-STAT VENOUS BLOOD GAS, ED  EKG  EKG Interpretation Date/Time:    Ventricular Rate:    PR Interval:    QRS Duration:    QT Interval:    QTC Calculation:   R Axis:      Text Interpretation:         Radiology No results found.  Medications Ordered in ED Medications  insulin  regular, human (MYXREDLIN ) 100 units/ 100 mL infusion (has no administration in time range)  lactated ringers  infusion (has no administration in time range)  dextrose  5 % in lactated ringers  infusion (has no administration in time range)  dextrose  50 % solution 0-50 mL (has no administration in time range)  potassium chloride  10 mEq in 100 mL IVPB (has no administration in time range)  lactated ringers  bolus 2,404 mL (2,404 mLs Intravenous New Bag/Given 07/27/23 2322)   Procedures .Critical Care  Performed by: Lindle Rhea, MD Authorized by: Lindle Rhea, MD   Critical care provider statement:    Critical care time (minutes):  32   Critical care was necessary to treat or prevent imminent or life-threatening deterioration of the following conditions:  Metabolic crisis   Critical care was time spent personally by me on the following activities:  Development of treatment plan with patient or surrogate, discussions with consultants, evaluation of patient's response to treatment, examination of patient, ordering and review of laboratory studies, ordering and review of radiographic studies, ordering and performing treatments and interventions, pulse oximetry, re-evaluation of  patient's condition and review of old charts   Care discussed with: admitting provider     (including critical care time) Medical Decision Making / ED Course   Medical Decision Making Amount and/or Complexity of Data Reviewed Labs: ordered. Decision-making details documented in ED Course.  Risk Prescription drug management. Decision regarding hospitalization.    Patient presents with hyperglycemia. Differential diagnosis considered.  Workup is consistent with early DKA with a bicarb of 17, anion gap of 18 ketones in the urine.  No evidence of UTI.  Likely from noncompliance.  Patient started on IV fluid bolus, insulin  drip.    Final Clinical Impression(s) / ED Diagnoses Final diagnoses:  Diabetic ketoacidosis without coma associated with type 2 diabetes mellitus (HCC)    This chart was dictated using voice recognition software.  Despite best efforts to proofread,  errors can occur which can change the documentation meaning.    Lindle Rhea, MD 07/27/23 [phone]

## 2023-07-27 NOTE — ED Triage Notes (Signed)
 Pt presents, type 2 diabetic, c/o CGM reading 'HIGH' for x4 days. Pt endorses dry mouth. Denies abd pain/blurred vision. Pt ran out of Lantus  and is waiting on prescription to go through at pharmacy. Prior hx pancreatitis x3   CBG 355 in triage

## 2023-07-28 ENCOUNTER — Observation Stay (HOSPITAL_BASED_OUTPATIENT_CLINIC_OR_DEPARTMENT_OTHER)

## 2023-07-28 DIAGNOSIS — E1010 Type 1 diabetes mellitus with ketoacidosis without coma: Secondary | ICD-10-CM | POA: Diagnosis present

## 2023-07-28 DIAGNOSIS — E1011 Type 1 diabetes mellitus with ketoacidosis with coma: Secondary | ICD-10-CM | POA: Diagnosis not present

## 2023-07-28 DIAGNOSIS — E441 Mild protein-calorie malnutrition: Secondary | ICD-10-CM | POA: Diagnosis present

## 2023-07-28 DIAGNOSIS — E871 Hypo-osmolality and hyponatremia: Secondary | ICD-10-CM | POA: Diagnosis not present

## 2023-07-28 DIAGNOSIS — E1110 Type 2 diabetes mellitus with ketoacidosis without coma: Secondary | ICD-10-CM | POA: Diagnosis present

## 2023-07-28 DIAGNOSIS — R7989 Other specified abnormal findings of blood chemistry: Secondary | ICD-10-CM | POA: Diagnosis present

## 2023-07-28 DIAGNOSIS — E66811 Obesity, class 1: Secondary | ICD-10-CM | POA: Diagnosis present

## 2023-07-28 DIAGNOSIS — R9431 Abnormal electrocardiogram [ECG] [EKG]: Secondary | ICD-10-CM

## 2023-07-28 DIAGNOSIS — R739 Hyperglycemia, unspecified: Secondary | ICD-10-CM | POA: Diagnosis not present

## 2023-07-28 DIAGNOSIS — Z87891 Personal history of nicotine dependence: Secondary | ICD-10-CM | POA: Diagnosis not present

## 2023-07-28 DIAGNOSIS — Z7401 Bed confinement status: Secondary | ICD-10-CM | POA: Diagnosis not present

## 2023-07-28 DIAGNOSIS — F1090 Alcohol use, unspecified, uncomplicated: Secondary | ICD-10-CM | POA: Diagnosis not present

## 2023-07-28 LAB — GLUCOSE, CAPILLARY
Glucose-Capillary: 173 mg/dL — ABNORMAL HIGH (ref 70–99)
Glucose-Capillary: 119 mg/dL — ABNORMAL HIGH (ref 70–99)
Glucose-Capillary: 118 mg/dL — ABNORMAL HIGH (ref 70–99)
Glucose-Capillary: 127 mg/dL — ABNORMAL HIGH (ref 70–99)
Glucose-Capillary: 136 mg/dL — ABNORMAL HIGH (ref 70–99)
Glucose-Capillary: 191 mg/dL — ABNORMAL HIGH (ref 70–99)
Glucose-Capillary: 192 mg/dL — ABNORMAL HIGH (ref 70–99)
Glucose-Capillary: 188 mg/dL — ABNORMAL HIGH (ref 70–99)
Glucose-Capillary: 161 mg/dL — ABNORMAL HIGH (ref 70–99)
Glucose-Capillary: 159 mg/dL — ABNORMAL HIGH (ref 70–99)
Glucose-Capillary: 184 mg/dL — ABNORMAL HIGH (ref 70–99)
Glucose-Capillary: 145 mg/dL — ABNORMAL HIGH (ref 70–99)
Glucose-Capillary: 171 mg/dL — ABNORMAL HIGH (ref 70–99)
Glucose-Capillary: 181 mg/dL — ABNORMAL HIGH (ref 70–99)
Glucose-Capillary: 218 mg/dL — ABNORMAL HIGH (ref 70–99)
Glucose-Capillary: 227 mg/dL — ABNORMAL HIGH (ref 70–99)

## 2023-07-28 LAB — BASIC METABOLIC PANEL WITH GFR
Anion gap: 10 (ref 5–15)
Anion gap: 11 (ref 5–15)
Anion gap: 10 (ref 5–15)
Anion gap: 8 (ref 5–15)
Anion gap: 10 (ref 5–15)
BUN: 11 mg/dL (ref 6–20)
BUN: 10 mg/dL (ref 6–20)
BUN: 9 mg/dL (ref 6–20)
BUN: 10 mg/dL (ref 6–20)
BUN: 8 mg/dL (ref 6–20)
CO2: 21 mmol/L — ABNORMAL LOW (ref 22–32)
CO2: 18 mmol/L — ABNORMAL LOW (ref 22–32)
CO2: 18 mmol/L — ABNORMAL LOW (ref 22–32)
CO2: 22 mmol/L (ref 22–32)
CO2: 21 mmol/L — ABNORMAL LOW (ref 22–32)
Calcium: 9 mg/dL (ref 8.9–10.3)
Calcium: 9 mg/dL (ref 8.9–10.3)
Calcium: 9.1 mg/dL (ref 8.9–10.3)
Calcium: 8.9 mg/dL (ref 8.9–10.3)
Calcium: 8.9 mg/dL (ref 8.9–10.3)
Chloride: 95 mmol/L — ABNORMAL LOW (ref 98–111)
Chloride: 107 mmol/L (ref 98–111)
Chloride: 107 mmol/L (ref 98–111)
Chloride: 101 mmol/L (ref 98–111)
Chloride: 104 mmol/L (ref 98–111)
Creatinine, Ser: <0.3 mg/dL — ABNORMAL LOW (ref 0.61–1.24)
Creatinine, Ser: 0.8 mg/dL (ref 0.61–1.24)
Creatinine, Ser: 0.73 mg/dL (ref 0.61–1.24)
Creatinine, Ser: 0.7 mg/dL (ref 0.61–1.24)
Creatinine, Ser: 0.76 mg/dL (ref 0.61–1.24)
GFR, Estimated: >60 mL/min (ref >60)
GFR, Estimated: >60 mL/min (ref >60)
GFR, Estimated: >60 mL/min (ref >60)
GFR, Estimated: >60 mL/min (ref >60)
Glucose, Bld: 256 mg/dL — ABNORMAL HIGH (ref 70–99)
Glucose, Bld: 154 mg/dL — ABNORMAL HIGH (ref 70–99)
Glucose, Bld: 173 mg/dL — ABNORMAL HIGH (ref 70–99)
Glucose, Bld: 230 mg/dL — ABNORMAL HIGH (ref 70–99)
Glucose, Bld: 172 mg/dL — ABNORMAL HIGH (ref 70–99)
Potassium: 4 mmol/L (ref 3.5–5.1)
Potassium: 3.8 mmol/L (ref 3.5–5.1)
Potassium: 3.5 mmol/L (ref 3.5–5.1)
Potassium: 3.5 mmol/L (ref 3.5–5.1)
Potassium: 3.6 mmol/L (ref 3.5–5.1)
Sodium: 126 mmol/L — ABNORMAL LOW (ref 135–145)
Sodium: 136 mmol/L (ref 135–145)
Sodium: 135 mmol/L (ref 135–145)
Sodium: 131 mmol/L — ABNORMAL LOW (ref 135–145)
Sodium: 135 mmol/L (ref 135–145)

## 2023-07-28 LAB — ECHOCARDIOGRAM COMPLETE
AR max vel: 2.07 cm2
AV Area VTI: 2.49 cm2
AV Area mean vel: 2.27 cm2
AV Mean grad: 4 mmHg
AV Peak grad: 7.1 mmHg
Ao pk vel: 1.33 m/s
Area-P 1/2: 3.14 cm2
Height: 72 in
S' Lateral: 3.4 cm
Weight: 4095.26 [oz_av]

## 2023-07-28 LAB — BETA-HYDROXYBUTYRIC ACID
Beta-Hydroxybutyric Acid: 2.03 mmol/L — ABNORMAL HIGH (ref 0.05–0.27)
Beta-Hydroxybutyric Acid: 1.22 mmol/L — ABNORMAL HIGH (ref 0.05–0.27)
Beta-Hydroxybutyric Acid: 0.79 mmol/L — ABNORMAL HIGH (ref 0.05–0.27)
Beta-Hydroxybutyric Acid: 0.36 mmol/L — ABNORMAL HIGH (ref 0.05–0.27)
Beta-Hydroxybutyric Acid: 0.35 mmol/L — ABNORMAL HIGH (ref 0.05–0.27)
Beta-Hydroxybutyric Acid: 0.19 mmol/L (ref 0.05–0.27)

## 2023-07-28 LAB — HEPATIC FUNCTION PANEL
ALT: 38 U/L (ref 0–44)
AST: 33 U/L (ref 15–41)
Albumin: 3.3 g/dL — ABNORMAL LOW (ref 3.5–5.0)
Alkaline Phosphatase: 94 U/L (ref 38–126)
Bilirubin, Direct: 0.4 mg/dL — ABNORMAL HIGH (ref 0.0–0.2)
Indirect Bilirubin: 0.7 mg/dL (ref 0.3–0.9)
Total Bilirubin: 1.1 mg/dL (ref 0.0–1.2)
Total Protein: 6.3 g/dL — ABNORMAL LOW (ref 6.5–8.1)

## 2023-07-28 LAB — CBC
HCT: 42 % (ref 39.0–52.0)
Hemoglobin: 14.3 g/dL (ref 13.0–17.0)
MCH: 30.2 pg (ref 26.0–34.0)
MCHC: 34 g/dL (ref 30.0–36.0)
MCV: 88.6 fL (ref 80.0–100.0)
Platelets: 206 10*3/uL (ref 150–400)
RBC: 4.74 MIL/uL (ref 4.22–5.81)
RDW: 12.4 % (ref 11.5–15.5)
WBC: 4.9 10*3/uL (ref 4.0–10.5)
nRBC: 0 % (ref 0.0–0.2)

## 2023-07-28 LAB — CBG MONITORING, ED
Glucose-Capillary: 173 mg/dL — ABNORMAL HIGH (ref 70–99)
Glucose-Capillary: 177 mg/dL — ABNORMAL HIGH (ref 70–99)
Glucose-Capillary: 209 mg/dL — ABNORMAL HIGH (ref 70–99)
Glucose-Capillary: 277 mg/dL — ABNORMAL HIGH (ref 70–99)

## 2023-07-28 LAB — MAGNESIUM: Magnesium: 2.1 mg/dL (ref 1.7–2.4)

## 2023-07-28 LAB — PHOSPHORUS: Phosphorus: 4 mg/dL (ref 2.5–4.6)

## 2023-07-28 LAB — LIPASE, BLOOD: Lipase: 26 U/L (ref 11–51)

## 2023-07-28 LAB — MRSA NEXT GEN BY PCR, NASAL: MRSA by PCR Next Gen: NOT DETECTED

## 2023-07-28 MED ORDER — ORAL CARE MOUTH RINSE: 15.0000 mL | OROMUCOSAL | Status: DC | PRN

## 2023-07-28 MED ORDER — ONDANSETRON HCL 4 MG PO TABS: 4.0000 mg | ORAL_TABLET | Freq: Four times a day (QID) | ORAL | Status: DC | PRN

## 2023-07-28 MED ORDER — INSULIN ASPART 100 UNIT/ML IJ SOLN
0.0000 [IU] | Freq: Three times a day (TID) | INTRAMUSCULAR | Status: DC
  Administered 2023-07-29: 5 [IU] via SUBCUTANEOUS

## 2023-07-28 MED ORDER — ENOXAPARIN SODIUM 60 MG/0.6ML IJ SOSY
60.0000 mg | PREFILLED_SYRINGE | INTRAMUSCULAR | Status: DC
  Administered 2023-07-28: 60 mg via SUBCUTANEOUS
  Filled 2023-07-28: qty 0.6

## 2023-07-28 MED ORDER — PERFLUTREN LIPID MICROSPHERE
1.0000 mL | INTRAVENOUS | Status: AC | PRN
  Administered 2023-07-28: 2 mL via INTRAVENOUS

## 2023-07-28 MED ORDER — LISINOPRIL 10 MG PO TABS
10.0000 mg | ORAL_TABLET | Freq: Every day | ORAL | Status: DC
  Administered 2023-07-28: 10 mg via ORAL
  Filled 2023-07-28: qty 1

## 2023-07-28 MED ORDER — ONDANSETRON HCL 4 MG/2ML IJ SOLN: 4.0000 mg | Freq: Four times a day (QID) | INTRAMUSCULAR | Status: DC | PRN

## 2023-07-28 MED ORDER — POLYETHYLENE GLYCOL 3350 17 G PO PACK: 17.0000 g | PACK | Freq: Every day | ORAL | Status: DC | PRN

## 2023-07-28 MED ORDER — MELATONIN 3 MG PO TABS: 6.0000 mg | ORAL_TABLET | Freq: Every evening | ORAL | Status: DC | PRN

## 2023-07-28 MED ORDER — INSULIN GLARGINE-YFGN 100 UNIT/ML ~~LOC~~ SOLN: 15.0000 [IU] | Freq: Every day | SUBCUTANEOUS | Status: DC

## 2023-07-28 MED ORDER — ALBUTEROL SULFATE (2.5 MG/3ML) 0.083% IN NEBU: 2.5000 mg | INHALATION_SOLUTION | RESPIRATORY_TRACT | Status: DC | PRN

## 2023-07-28 MED ORDER — INSULIN ASPART 100 UNIT/ML IJ SOLN
3.0000 [IU] | Freq: Once | INTRAMUSCULAR | Status: AC
  Administered 2023-07-28: 3 [IU] via SUBCUTANEOUS

## 2023-07-28 MED ORDER — POTASSIUM CHLORIDE CRYS ER 20 MEQ PO TBCR
40.0000 meq | EXTENDED_RELEASE_TABLET | Freq: Once | ORAL | Status: AC
  Administered 2023-07-28: 40 meq via ORAL
  Filled 2023-07-28: qty 2

## 2023-07-28 MED ORDER — INSULIN GLARGINE-YFGN 100 UNIT/ML ~~LOC~~ SOLN
15.0000 [IU] | Freq: Every day | SUBCUTANEOUS | Status: DC
  Administered 2023-07-28: 15 [IU] via SUBCUTANEOUS
  Filled 2023-07-28 (×2): qty 0.15

## 2023-07-28 MED ORDER — CHLORHEXIDINE GLUCONATE CLOTH 2 % EX PADS
6.0000 | MEDICATED_PAD | Freq: Every day | CUTANEOUS | Status: DC
  Filled 2023-07-28: qty 6

## 2023-07-28 MED ORDER — ACETAMINOPHEN 500 MG PO TABS
1000.0000 mg | ORAL_TABLET | Freq: Four times a day (QID) | ORAL | Status: DC | PRN
  Administered 2023-07-28: 1000 mg via ORAL
  Filled 2023-07-28: qty 2

## 2023-07-28 NOTE — Progress Notes (Signed)
   07/28/23 1042  TOC Brief Assessment  Insurance and Status Reviewed  Patient has primary care physician Yes  Home environment has been reviewed Lives in an apartment  Prior level of function: Independent  Prior/Current Home Services No current home services  Social Drivers of Health Review SDOH reviewed no interventions necessary  Readmission risk has been reviewed Yes (N/A)  Transition of care needs no transition of care needs at this time

## 2023-07-28 NOTE — Plan of Care (Signed)

## 2023-07-28 NOTE — ED Notes (Signed)
 Report given to Engineer, mining at [facility]. Pt updated that he will be transported by [facility] to [facility], given pt information card and verbalized understanding.

## 2023-07-28 NOTE — ED Notes (Signed)
 Lab called, delay with BMP bc his sample is Lipemic

## 2023-07-28 NOTE — Inpatient Diabetes Management (Signed)
 Inpatient Diabetes Program Recommendations  AACE/ADA: New Consensus Statement on Inpatient Glycemic Control (2015)  Target Ranges:  Prepandial:   less than 140 mg/dL      Peak postprandial:   less than 180 mg/dL (1-2 hours)      Critically ill patients:  140 - 180 mg/dL   Lab Results  Component Value Date   GLUCAP 184 (H) 07/28/2023   HGBA1C 14.6 (H) 05/11/2022    Review of Glycemic Control  Diabetes history: DM2 Outpatient Diabetes medications: Lantus  40 units daily, metformin  1500 mg BID Current orders for Inpatient glycemic control: IV insulin  per EndoTool  Inpatient Diabetes Program Recommendations:    Semglee  15 units BID (give first dose 1-2 hours prior to discontinuation of drip)  Novolog  0-15 TID with meals and 0-5 HS  Will likely need meal coverage insulin .  Please order updated HgbA1C to assess glycemic control prior to admission  Spoke with pt at bedside regarding his diabetes control at home. No updated HgbA1C - last one 14.6% over a year ago. Pt states he does not take his Lantus  on many days. Tries to make healthy choices with his diet and gets exercise on most days. Has CGM for monitoring his blood sugars. Discussed glucose and A1C goals. Discussed importance of checking CBGs and maintaining good CBG control to prevent long-term and short-term complications. Explained how hyperglycemia leads to damage within blood vessels which lead to the common complications seen with uncontrolled diabetes. Stressed to the patient the importance of improving glycemic control to prevent further complications from uncontrolled diabetes. Discussed impact of nutrition, exercise, stress, sickness, and medications on diabetes control.  Discussed carbohydrates, carbohydrate goals per day and meal, along with portion sizes.   Pt states he will get back on track with taking care of himself, follow-up with PCP and take insulin  and DM meds as prescribed. Answered all questions.  Thank  you. Joni Net, RD, LDN, CDCES Inpatient Diabetes Coordinator [phone]

## 2023-07-28 NOTE — H&P (Signed)
 History and Physical    Patient: Douglas [name]. UEA:[id number] DOB: [birth year] DOA: 07/27/2023 DOS: the patient was seen and examined on 07/28/2023 PCP: Tristan Furlough, NP  Patient coming from: Home  Chief Complaint:  Chief Complaint  Patient presents with   Hyperglycemia   HPI: Douglas Dekker Verga. is a 38 y.o. male with medical history significant of class I obesity, type 2 diabetes, hyperlipidemia, hypertension, pancreatitis, history of peripancreatic fluid collection, history of normal LFTs, paroxysmal atrial fibrillation who presented to the emergency department complaints of hyperglycemia for the past 4 days after he ran out of glargine insulin .  He has been waiting for his prescription to go through his pharmacy.  He has polyuria and polydipsia, but no blurred vision.  He denied fever, chills, rhinorrhea, sore throat, wheezing or hemoptysis.  No chest pain, palpitations, diaphoresis, PND, orthopnea or pitting edema of the lower extremities.  No abdominal pain, nausea, emesis, diarrhea, constipation, melena or hematochezia.  No flank pain, dysuria, frequency or hematuria.   Lab work: Urinalysis with a specific gravity 1.040, glucose greater than 1000 and ketones of 40 mg/dL.  There was trace protein.  CBC showed white count 6.1, hemoglobin 15.6 g/dL platelets 478.  Venous blood gas showed a PO2 of 46 mmHg, was otherwise within normal range after sodium correction.  Beta hydroxybutyric acid 2.03 mmol/L.   ED course: Initial vital signs were temperature 98 F, pulse 98, respiration 20, BP 128/88 mmHg O2 sat 96% on room air.  He is given IV fluids, potassium supplementation and was started on an insulin  infusion.  Review of Systems: As mentioned in the history of present illness. All other systems reviewed and are negative.  Past Medical History:  Diagnosis Date   Diabetes (HCC)    Hypercholesteremia    Hypertension    Pancreatitis 08/2017   Past Surgical History:   Procedure Laterality Date   SHOULDER SURGERY Right    July 2023   TONSILLECTOMY     WISDOM TOOTH EXTRACTION     Social History:  reports that he quit smoking about 20 years ago. His smoking use included cigarettes. He has never used smokeless tobacco. He reports current alcohol use. He reports that he does not use drugs.  No Known Allergies  Family History  Problem Relation Age of Onset   Hypercholesterolemia Mother    Hypertension Mother    Diabetes Father    Colon cancer Neg Hx    Esophageal cancer Neg Hx    Inflammatory bowel disease Neg Hx    Liver disease Neg Hx    Pancreatic cancer Neg Hx    Rectal cancer Neg Hx    Stomach cancer Neg Hx     Prior to Admission medications   Medication Sig Start Date End Date Taking? Authorizing Provider  atorvastatin  (LIPITOR) 80 MG tablet Take 1 tablet (80 mg total) by mouth daily at 6 PM. 05/13/22   Gherghe, Costin M, MD  fenofibrate  160 MG tablet Take 1 tablet (160 mg total) by mouth daily. 05/13/22   Gherghe, Costin M, MD  insulin  glargine (LANTUS ) 100 UNIT/ML Solostar Pen Inject 15 Units into the skin daily. 05/13/22   Gherghe, Costin M, MD  lisinopril (ZESTRIL) 10 MG tablet Take 10 mg by mouth daily. 06/08/22   [provider]  metFORMIN  (GLUCOPHAGE ) 500 MG tablet Take 1-2 tablets (500-1,000 mg total) by mouth 2 (two) times daily with a meal. Take 2 tablets in the AM and 1 tablet in the  PM 05/13/22 06/22/22  Gherghe, Costin M, MD  metoprolol  succinate (TOPROL -XL) 50 MG 24 hr tablet Take 1 tablet (50 mg total) by mouth daily. 09/02/19   Haydee Lipa, MD  ondansetron  (ZOFRAN -ODT) 4 MG disintegrating tablet 4mg  ODT q6 hours prn nausea/vomit 07/16/22   Zavitz, Joshua, MD  pantoprazole  (PROTONIX ) 40 MG tablet Take 40 mg by mouth daily. 06/26/19   [provider]    Physical Exam: Vitals:   07/28/23 0500 07/28/23 0600 07/28/23 0700 07/28/23 0730  BP: (!) 132/94 133/83 108/61   Pulse: 72 69 69 69  Resp: (!) 22 17 18 20    Temp:      TempSrc:      SpO2: 99% 98% 96% 98%  Weight:      Height:       Physical Exam Vitals and nursing note reviewed.  Constitutional:      Appearance: Normal appearance. He is ill-appearing.  HENT:     Head: Normocephalic.     Nose: No rhinorrhea.     Mouth/Throat:     Mouth: Mucous membranes are dry.  Eyes:     General: No scleral icterus.    Pupils: Pupils are equal, round, and reactive to light.  Cardiovascular:     Rate and Rhythm: Normal rate and regular rhythm.  Pulmonary:     Effort: Pulmonary effort is normal.     Breath sounds: Normal breath sounds. No wheezing, rhonchi or rales.  Abdominal:     General: Bowel sounds are normal. There is no distension.     Palpations: Abdomen is soft.     Tenderness: There is no abdominal tenderness. There is no guarding.  Musculoskeletal:     Cervical back: Neck supple.     Right lower leg: No edema.     Left lower leg: No edema.  Skin:    General: Skin is warm and dry.  Neurological:     General: No focal deficit present.     Mental Status: He is alert and oriented to person, place, and time.  Psychiatric:        Mood and Affect: Mood normal.        Behavior: Behavior normal.    Data Reviewed:  Results are pending, will review when available. 08/26/2019 ECHO IMPRESSIONS:   1. Abnormal septal motion . Left ventricular ejection fraction, by  estimation, is 50 to 55%. The left ventricle has low normal function. The  left ventricle has no regional wall motion abnormalities. There is mild  left ventricular hypertrophy. Left  ventricular diastolic parameters are consistent with Grade I diastolic  dysfunction (impaired relaxation).   2. Right ventricular systolic function is normal. The right ventricular  size is normal.   3. The mitral valve is normal in structure. No evidence of mitral valve  regurgitation. No evidence of mitral stenosis.   4. The aortic valve is tricuspid. Aortic valve regurgitation is not   visualized. No aortic stenosis is present.   5. The inferior vena cava is normal in size with greater than 50%  respiratory variability, suggesting right atrial pressure of 3 mmHg.   EKG: Vent. rate 69 BPM PR interval 168 ms QRS duration 120 ms QT/QTcB 390/417 ms P-R-T axes 36 71 -26 Normal sinus rhythm Non-specific intra-ventricular conduction delay T wave abnormality, consider inferior ischemia T wave abnormality, consider anterior ischemia Abnormal ECG  Assessment and Plan: Principal Problem:   DKA, type 1 (HCC) Observation/stepdown. Keep NPO. Continue IV fluids. Continue insulin  infusion. Monitor CBG  closely. BMP every 4 hours. BHA every4 hours. Replace electrolytes as needed. Consult diabetes coordinator. Transition to SQ insulin  per Endo tool.  Active Problems:   Pseudohyponatremia Secondary to hyperglycemia.   Continue DKA treatment.    Hypertension Continue lisinopril 10 mg p.o. daily.    Hyperlipidemia Continue atorvastatin  80 mg p.o. daily.    Class 1 obesity Current BMI  kg/m. Would benefit from lifestyle modifications. Follow-up closely with PCP and/or bariatric clinic.    Mild protein malnutrition (HCC) In the setting of acute illness. According to patient, no proteinuria. Advised to follow closely urine microalbumin results. Follow-up albumin level in a.m.     Advance Care Planning:   Code Status: Full Code   Consults:   Family Communication:   Severity of Illness: The appropriate patient status for this patient is OBSERVATION. Observation status is judged to be reasonable and necessary in order to provide the required intensity of service to ensure the patient's safety. The patient's presenting symptoms, physical exam findings, and initial radiographic and laboratory data in the context of their medical condition is felt to place them at decreased risk for further clinical deterioration. Furthermore, it is anticipated that the patient  will be medically stable for discharge from the hospital within 2 midnights of admission.   Author: Danice Dural, MD 07/28/2023 7:50 AM  For on call review [email or web address].   This document was prepared using Dragon voice recognition software and may contain some unintended transcription errors.

## 2023-07-28 NOTE — ED Notes (Signed)
CBG 277  

## 2023-07-29 ENCOUNTER — Encounter (HOSPITAL_COMMUNITY): Payer: Self-pay

## 2023-07-29 DIAGNOSIS — E1010 Type 1 diabetes mellitus with ketoacidosis without coma: Secondary | ICD-10-CM | POA: Diagnosis not present

## 2023-07-29 LAB — CBC
HCT: 41.7 % (ref 39.0–52.0)
Hemoglobin: 14 g/dL (ref 13.0–17.0)
MCH: 29.8 pg (ref 26.0–34.0)
MCHC: 33.6 g/dL (ref 30.0–36.0)
MCV: 88.7 fL (ref 80.0–100.0)
Platelets: 175 10*3/uL (ref 150–400)
RBC: 4.7 MIL/uL (ref 4.22–5.81)
RDW: 12.5 % (ref 11.5–15.5)
WBC: 4.4 10*3/uL (ref 4.0–10.5)
nRBC: 0 % (ref 0.0–0.2)

## 2023-07-29 LAB — HIV ANTIBODY (ROUTINE TESTING W REFLEX): HIV Screen 4th Generation wRfx: NONREACTIVE

## 2023-07-29 LAB — COMPREHENSIVE METABOLIC PANEL WITH GFR
ALT: 33 U/L (ref 0–44)
AST: 29 U/L (ref 15–41)
Albumin: 3.2 g/dL — ABNORMAL LOW (ref 3.5–5.0)
Alkaline Phosphatase: 66 U/L (ref 38–126)
Anion gap: 17 — ABNORMAL HIGH (ref 5–15)
BUN: 10 mg/dL (ref 6–20)
CO2: 14 mmol/L — ABNORMAL LOW (ref 22–32)
Calcium: 9.3 mg/dL (ref 8.9–10.3)
Chloride: 105 mmol/L (ref 98–111)
Creatinine, Ser: 0.75 mg/dL (ref 0.61–1.24)
GFR, Estimated: >60 mL/min (ref >60)
Glucose, Bld: 241 mg/dL — ABNORMAL HIGH (ref 70–99)
Potassium: 4.1 mmol/L (ref 3.5–5.1)
Sodium: 136 mmol/L (ref 135–145)
Total Bilirubin: 0.7 mg/dL (ref 0.0–1.2)
Total Protein: 6.4 g/dL — ABNORMAL LOW (ref 6.5–8.1)

## 2023-07-29 LAB — GLUCOSE, CAPILLARY: Glucose-Capillary: 240 mg/dL — ABNORMAL HIGH (ref 70–99)

## 2023-07-29 LAB — HEMOGLOBIN A1C
Hgb A1c MFr Bld: >15.5 % — ABNORMAL HIGH (ref 4.8–5.6)
Mean Plasma Glucose: >398 mg/dL

## 2023-07-29 MED ORDER — METFORMIN HCL 500 MG PO TABS: 1500.0000 mg | ORAL_TABLET | Freq: Every day | ORAL | Status: AC

## 2023-07-29 MED ORDER — ONDANSETRON HCL 4 MG PO TABS: 4.0000 mg | ORAL_TABLET | Freq: Four times a day (QID) | ORAL | 0 refills | Status: AC | PRN

## 2023-07-29 MED ORDER — INSULIN GLARGINE 100 UNIT/ML SOLOSTAR PEN: 30.0000 [IU] | PEN_INJECTOR | Freq: Every day | SUBCUTANEOUS | Status: AC

## 2023-07-29 NOTE — Plan of Care (Signed)
  Problem: Education: Goal: Knowledge of General Education information will improve Description: Including pain rating scale, medication(s)/side effects and non-pharmacologic comfort measures Outcome: Progressing   Problem: Nutrition: Goal: Adequate nutrition will be maintained Outcome: Progressing   Problem: Coping: Goal: Level of anxiety will decrease Outcome: Progressing   Problem: Elimination: Goal: Will not experience complications related to urinary retention Outcome: Progressing   Problem: Pain Managment: Goal: General experience of comfort will improve and/or be controlled Outcome: Progressing   Problem: Safety: Goal: Ability to remain free from injury will improve Outcome: Progressing   Problem: Coping: Goal: Ability to adjust to condition or change in health will improve Outcome: Progressing

## 2023-07-29 NOTE — Discharge Summary (Signed)
 Physician Discharge Summary  Douglas [name]. ZOX:[id number] DOB: 05/29/1985 DOA: 07/27/2023  PCP: Tristan Furlough, NP  Admit date: 07/27/2023 Discharge date: 07/29/2023  Admitted From: Home Disposition: Home  Recommendations for Outpatient Follow-up:  Follow up with PCP in 1 week with repeat CBC/BMP Outpatient follow-up with endocrinologist Follow up in ED if symptoms worsen or new appear   Home Health: No Equipment/Devices: None  Discharge Condition: Stable CODE STATUS: Full Diet recommendation: Carb modified  Brief/Interim Summary: 38 year old male with history of diabetes mellitus type 2, hyperlipidemia, hypertension, pancreatitis, paroxysmal A-fib presented with hyperglycemia after running out of insulin  4 days prior to presentation.  On presentation, he was found to be in DKA and was started on IV fluids and insulin  drip.  During the hospitalization, his condition has improved.  He has already been transitioned to long-acting subcutaneous insulin  from insulin  drip.  He is tolerating diet and feels okay debonded.  He may be discharged home today and he will continue his long-acting home insulin  regimen.  Outpatient follow-up with PCP and endocrinology  Discharge Diagnoses:   DKA in a patient with diabetes mellitus type II uncontrolled -On presentation, he was found to be in DKA and was started on IV fluids and insulin  drip.  During the hospitalization, his condition has improved.  He has already been transitioned to long-acting subcutaneous insulin  from insulin  drip.  He is tolerating diet and feels okay debonded.  He may be discharged home today and he will continue his long-acting home insulin  regimen.  Outpatient follow-up with PCP and endocrinology - He states that he has prescription for long-acting insulin  ready to be picked up today from his pharmacy  Pseudohyponatremia - Sodium normal today  Hyperlipidemia--continue statin  Hypertension - Blood pressure on  the lower side.  Hold lisinopril on discharge  Obesity class I -Outpatient follow-up  Mild protein calorie malnutrition - Encourage oral intake.  Outpatient follow-up.   Discharge Instructions  Discharge Instructions     Diet Carb Modified   Complete by: As directed    Increase activity slowly   Complete by: As directed       Allergies as of 07/29/2023   No Known Allergies      Medication List     STOP taking these medications    lisinopril 10 MG tablet Commonly known as: ZESTRIL       TAKE these medications    atorvastatin  80 MG tablet Commonly known as: LIPITOR Take 1 tablet (80 mg total) by mouth daily at 6 PM.   insulin  glargine 100 UNIT/ML Solostar Pen Commonly known as: LANTUS  Inject 30-40 Units into the skin daily.   metFORMIN  500 MG tablet Commonly known as: GLUCOPHAGE  Take 3 tablets (1,500 mg total) by mouth daily.   ondansetron  4 MG tablet Commonly known as: ZOFRAN  Take 1 tablet (4 mg total) by mouth every 6 (six) hours as needed for nausea.        Follow-up Information     Tristan Furlough, NP. Schedule an appointment as soon as possible for a visit in 1 week(s).   Specialty: Nurse Practitioner Contact information: [address] [place] [address] [phone]                No Known Allergies  Consultations: None   Procedures/Studies: ECHOCARDIOGRAM COMPLETE Result Date: 07/28/2023    ECHOCARDIOGRAM REPORT   Patient Name:   Douglas [name]. Date of Exam: 07/28/2023 Medical Rec #:  [id number]  Height:       72.0 in Accession #:    [id number]              Weight:       256.0 lb Date of Birth:  [birth year]               BSA:          2.366 m Patient Age:    38 years                BP:           149/111 mmHg Patient Gender: M                       HR:           74 bpm. Exam Location:  Inpatient Procedure: 2D Echo, Cardiac Doppler and Color Doppler (Both Spectral and Color            Flow  Doppler were utilized during procedure). Indications:    Abnormal ECG R94.31  History:        Patient has prior history of Echocardiogram examinations, most                 recent 08/26/2019. Abnormal ECG; Risk Factors:Hypertension and                 Diabetes.  Sonographer:    Astrid Blamer Referring Phys: [id number] DAVID MANUEL ORTIZ IMPRESSIONS  1. Left ventricular ejection fraction, by estimation, is 50 to 55%. The left ventricle has low normal function. The left ventricle has no regional wall motion abnormalities. There is mild eccentric left ventricular hypertrophy. Left ventricular diastolic parameters were normal.  2. Right ventricular systolic function is normal. The right ventricular size is normal. There is normal pulmonary artery systolic pressure.  3. The mitral valve is normal in structure. Trivial mitral valve regurgitation. No evidence of mitral stenosis.  4. The aortic valve has an indeterminant number of cusps. There is mild calcification of the aortic valve. Aortic valve regurgitation is not visualized. Aortic valve sclerosis is present, with no evidence of aortic valve stenosis.  5. The inferior vena cava is normal in size with greater than 50% respiratory variability, suggesting right atrial pressure of 3 mmHg. Comparison(s): No significant change from prior study. Conclusion(s)/Recommendation(s): Otherwise normal echocardiogram, with minor abnormalities described in the report. FINDINGS  Left Ventricle: Left ventricular ejection fraction, by estimation, is 50 to 55%. The left ventricle has low normal function. The left ventricle has no regional wall motion abnormalities. Definity contrast agent was given IV to delineate the left ventricular endocardial borders. The left ventricular internal cavity size was normal in size. There is mild eccentric left ventricular hypertrophy. Left ventricular diastolic parameters were normal. Right Ventricle: The right ventricular size is normal. No increase in  right ventricular wall thickness. Right ventricular systolic function is normal. There is normal pulmonary artery systolic pressure. The tricuspid regurgitant velocity is 2.35 m/s, and  with an assumed right atrial pressure of 3 mmHg, the estimated right ventricular systolic pressure is 25.1 mmHg. Left Atrium: Left atrial size was normal in size. Right Atrium: Right atrial size was normal in size. Pericardium: There is no evidence of pericardial effusion. Mitral Valve: The mitral valve is normal in structure. Trivial mitral valve regurgitation. No evidence of mitral valve stenosis. Tricuspid Valve: The tricuspid valve is normal in structure. Tricuspid valve regurgitation is trivial. No evidence of tricuspid stenosis. Aortic Valve: The  aortic valve has an indeterminant number of cusps. There is mild calcification of the aortic valve. Aortic valve regurgitation is not visualized. Aortic valve sclerosis is present, with no evidence of aortic valve stenosis. Aortic valve  mean gradient measures 4.0 mmHg. Aortic valve peak gradient measures 7.1 mmHg. Aortic valve area, by VTI measures 2.49 cm. Pulmonic Valve: The pulmonic valve was not well visualized. Pulmonic valve regurgitation is not visualized. No evidence of pulmonic stenosis. Aorta: The aortic root and ascending aorta are structurally normal, with no evidence of dilitation. Venous: The inferior vena cava is normal in size with greater than 50% respiratory variability, suggesting right atrial pressure of 3 mmHg. IAS/Shunts: The atrial septum is grossly normal.  LEFT VENTRICLE PLAX 2D LVIDd:         4.60 cm   Diastology LVIDs:         3.40 cm   LV e' medial:    7.83 cm/s LV PW:         1.20 cm   LV E/e' medial:  12.3 LV IVS:        1.10 cm   LV e' lateral:   11.90 cm/s LVOT diam:     2.00 cm   LV E/e' lateral: 8.1 LV SV:         54 LV SV Index:   23 LVOT Area:     3.14 cm  RIGHT VENTRICLE RV S prime:     15.30 cm/s TAPSE (M-mode): 2.4 cm LEFT ATRIUM              Index        RIGHT ATRIUM           Index LA Vol (A2C):   56.7 ml 23.96 ml/m  RA Area:     19.10 cm LA Vol (A4C):   53.9 ml 22.78 ml/m  RA Volume:   57.00 ml  24.09 ml/m LA Biplane Vol: 55.1 ml 23.29 ml/m  AORTIC VALVE AV Area (Vmax):    2.07 cm AV Area (Vmean):   2.27 cm AV Area (VTI):     2.49 cm AV Vmax:           133.00 cm/s AV Vmean:          97.000 cm/s AV VTI:            0.217 m AV Peak Grad:      7.1 mmHg AV Mean Grad:      4.0 mmHg LVOT Vmax:         87.50 cm/s LVOT Vmean:        70.000 cm/s LVOT VTI:          0.172 m LVOT/AV VTI ratio: 0.79  AORTA Ao Root diam: 3.30 cm MITRAL VALVE               TRICUSPID VALVE MV Area (PHT): 3.14 cm    TR Peak grad:   22.1 mmHg MV E velocity: 96.00 cm/s  TR Vmax:        235.00 cm/s MV A velocity: 51.80 cm/s MV E/A ratio:  1.85        SHUNTS                            Systemic VTI:  0.17 m                            Systemic Diam:  2.00 cm Sheryle Donning MD Electronically signed by Sheryle Donning MD Signature Date/Time: 07/28/2023/7:38:55 PM    Final       Subjective: Patient seen and examined at bedside.  Feels much better and wants to go home today.  Tolerating diet.  Denies worsening abdominal pain.  Discharge Exam: Vitals:   07/29/23 0800 07/29/23 0900  BP: 116/63 119/67  Pulse: 75 78  Resp: 17 20  Temp:    SpO2: 98% 96%    General: Pt is alert, awake, not in acute distress.  On room air. Cardiovascular: rate controlled, S1/S2 + Respiratory: bilateral decreased breath sounds at bases Abdominal: Soft, obese, NT, ND, bowel sounds + Extremities: no edema, no cyanosis    The results of significant diagnostics from this hospitalization (including imaging, microbiology, ancillary and laboratory) are listed below for reference.     Microbiology: Recent Results (from the past 240 hours)  MRSA Next Gen by PCR, Nasal     Status: None   Collection Time: 07/28/23  4:39 AM   Specimen: Nasal Mucosa; Nasal Swab  Result Value Ref  Range Status   MRSA by PCR Next Gen NOT DETECTED NOT DETECTED Final    Comment: (NOTE) The GeneXpert MRSA Assay (FDA approved for NASAL specimens only), is one component of a comprehensive MRSA colonization surveillance program. It is not intended to diagnose MRSA infection nor to guide or monitor treatment for MRSA infections. Test performance is not FDA approved in patients less than 85 years old. Performed at [facility], [address], [place], [place] [address]      Labs: BNP (last 3 results) No results for input(s): BNP in the last 8760 hours. Basic Metabolic Panel: Recent Labs  Lab 07/28/23 0046 07/28/23 0854 07/28/23 0901 07/28/23 1304 07/28/23 1708 07/28/23 2041  NA 126* 136  --  135 135 131*  K 4.0 3.8  --  3.5 3.6 3.5  CL 95* 107  --  107 104 101  CO2 21* 18*  --  18* 21* 22  GLUCOSE 256* 154*  --  173* 172* 230*  BUN 11 10  --  9 8 10   CREATININE <0.30* 0.80  --  0.73 0.76 0.70  CALCIUM  9.0 9.0  --  9.1 8.9 8.9  MG  --   --  2.1  --   --   --   PHOS  --   --  4.0  --   --   --    Liver Function Tests: Recent Labs  Lab 07/28/23 0901  AST 33  ALT 38  ALKPHOS 94  BILITOT 1.1  PROT 6.3*  ALBUMIN 3.3*   Recent Labs  Lab 07/28/23 0901  LIPASE 26   No results for input(s): AMMONIA in the last 168 hours. CBC: Recent Labs  Lab 07/27/23 2003 07/27/23 2321 07/28/23 0644 07/29/23 0302  WBC 6.1  --  4.9 4.4  HGB 15.6 15.3 14.3 14.0  HCT 42.6 45.0 42.0 41.7  MCV 84.4  --  88.6 88.7  PLT 228  --  206 175   Cardiac Enzymes: No results for input(s): CKTOTAL, CKMB, CKMBINDEX, TROPONINI in the last 168 hours. BNP: Invalid input(s): POCBNP CBG: Recent Labs  Lab 07/28/23 1742 07/28/23 1847 07/28/23 1950 07/28/23 2141 07/29/23 0736  GLUCAP 171* 181* 218* 227* 240*   D-Dimer No results for input(s): DDIMER in the last 72 hours. Hgb A1c No results for input(s): HGBA1C in the last 72 hours. Lipid Profile No  results for  input(s): CHOL, HDL, LDLCALC, TRIG, CHOLHDL, LDLDIRECT in the last 72 hours. Thyroid function studies No results for input(s): TSH, T4TOTAL, T3FREE, THYROIDAB in the last 72 hours.  Invalid input(s): FREET3 Anemia work up No results for input(s): VITAMINB12, FOLATE, FERRITIN, TIBC, IRON, RETICCTPCT in the last 72 hours. Urinalysis    Component Value Date/Time   COLORURINE YELLOW 07/27/2023 2002   APPEARANCEUR CLEAR 07/27/2023 2002   LABSPEC 1.040 (H) 07/27/2023 2002   PHURINE 6.5 07/27/2023 2002   GLUCOSEU >1,000 (A) 07/27/2023 2002   HGBUR NEGATIVE 07/27/2023 2002   BILIRUBINUR NEGATIVE 07/27/2023 2002   KETONESUR 40 (A) 07/27/2023 2002   PROTEINUR TRACE (A) 07/27/2023 2002   NITRITE NEGATIVE 07/27/2023 2002   LEUKOCYTESUR NEGATIVE 07/27/2023 2002   Sepsis Labs Recent Labs  Lab 07/27/23 2003 07/28/23 0644 07/29/23 0302  WBC 6.1 4.9 4.4   Microbiology Recent Results (from the past 240 hours)  MRSA Next Gen by PCR, Nasal     Status: None   Collection Time: 07/28/23  4:39 AM   Specimen: Nasal Mucosa; Nasal Swab  Result Value Ref Range Status   MRSA by PCR Next Gen NOT DETECTED NOT DETECTED Final    Comment: (NOTE) The GeneXpert MRSA Assay (FDA approved for NASAL specimens only), is one component of a comprehensive MRSA colonization surveillance program. It is not intended to diagnose MRSA infection nor to guide or monitor treatment for MRSA infections. Test performance is not FDA approved in patients less than 32 years old. Performed at [facility], [address] [place], [place] [address]      Time coordinating discharge: 35 minutes  SIGNED:   Audria Leather, MD  [facility] 07/29/2023, 9:12 AM

## 2023-09-06 NOTE — Progress Notes (Signed)
 Pt was given prevnar-20 vaccination 0.5mL IM RD. Pt was instructed to wait 15 minutes prior to leaving in case of any adverse reactions

## 2023-09-06 NOTE — Progress Notes (Signed)
 Patient presents with  . Annual Exam    CPE/Fasting BS: 235    Pt is here for annual CPE.  He is fasting  Denies any acute concerns at this time  Smoking status: none Alcohol: none Recreational drugs: none Concern for STDs: none Dentist: UTD UTD with eye exam  Recent ED visits: none New medications since last visit: none   No FH CRC.  Denies blood in stool or dark, tarry stools  FH sudden cardiac death before age of 9: none   Additional complaints beyond the scope of the Wellness Exam include:   T2DM, uncontrolled  He was in hospital since our last visit for DKA.  Has established with Dr Beryl, endocrinologist On metformin  2000mg  daily, humalog 10 units TID and lantus  44units daily   Diet: low carb/low sugar diet  Exercise: regular  Denies PPPs, peripheral numbness/tingling and visual disturbance Eye exam UTD Lab Results  Component Value Date   Hemoglobin A1c 14.9 (H) 06/07/2023    Hyperlipidemia Taking medications as prescribed. Reports compliance 7/7 days. Denies muscle pain, weakness, fatigue more than usual. Lab Results  Component Value Date   Cholesterol, Total 353 (H) 06/07/2023   Lab Results  Component Value Date   HDL 62 06/07/2023   Lab Results  Component Value Date   LDL 196 (H) 06/07/2023   LDL Calc Comment Comment 06/07/2023   Lab Results  Component Value Date   Triglycerides 459 (H) 06/07/2023   No results found for: [facility] Lab Results  Component Value Date   ALT (SGPT) 36 06/07/2023   AST 23 06/07/2023   Alkaline Phosphatase 230 (H) 06/07/2023   Total Bilirubin 0.3 06/07/2023          Patient Active Problem List   Diagnosis Date Noted  . History of acute pancreatitis 12/01/2022  . RIGHT shoulder arthroscopy with proximal biceps tenodesis and subacromial decompression-08/14/2021 08/14/2021  . Superior glenoid labrum lesion of right shoulder 08/02/2021  . Impingement syndrome of right shoulder 07/31/2021  . Biceps tendinitis on  right 07/31/2021  . Anemia 01/17/2020  . 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 Paroxysmal atrial fibrillation (*) 09/05/2019  . Abnormal ECG 09/05/2019  . Type 2 diabetes mellitus without complication, with long-term current use of insulin  (*) 09/29/2017  . Metabolic syndrome 01/01/2017  . Elevated LFTs 11/02/2016  . Hypertension associated with type 2 diabetes mellitus (*) 10/29/2016  . Mixed hyperlipidemia 10/29/2016  . Chest pain 07/15/2011    Formatting of this note might be different from the original. [facility]  [facility] - sharp right-sided chest pain that awoke him at 1 AM this morning with associated feeling of anxiety and diaphoresis. He also reports associated paresthesias into his left arm   Chest x-ray was unremarkable EKG no evidence of acute ischemia or ectopy.  Laboratory - normal cbc, bmp , esr  16, CRP of 1.8, initial CK was 745 at that trended downward with IV  fluid and he has had 3 sets of negative cardiac enzymes. Nuclear stress test on 07/14/11 was normal , LVEF 57%  She was given oxycodone  /acetaminophen  5-325 - #8   Chest pain is better.  His metoprolol  bottle was lost by the hospital - will refill  He will make appointment with cardiologist   . Headache 01/14/2009  . Murmur 11/02/1995   Allergies[1]  Medications Taking[2]  Past Medical History:  Diagnosis Date  . Diabetes mellitus (*)   . Hyperlipidemia   .  Hypertension   . Migraines    Past Surgical History:  Procedure Laterality Date  . Flexible sigmoidoscopy    . Shoulder arthroscopy Right 08/14/2021   RIGHT shoulder arthroscopy with proximal biceps tenodesis and subacromial decompression  . Sigmoidoscopy  2015?   [place]  . Tonsillectomy and adenoidectomy Bilateral 2015  . Wisdom tooth extraction  2012   Social History[3] Family History  Problem Relation Age of Onset  . Hyperlipidemia Mother   . Hypertension  Mother   . Diabetes Father   . Tumor Sister   . Coronary artery disease Sister   . Colon cancer Neg Hx   . Colon polyps Neg Hx    Immunization History  Administered Date(s) Administered  . DTP 05/29/1986, 08/01/1986, 10/26/1986, 12/09/1987, 08/11/1991  . Hep B Immune Globulin 08/29/1997, 09/27/1997, 03/26/1998  . Hib, Unspecified Formulation 12/09/1987  . Influenza Flulaval Quad 0.40ml 10/29/2016, 11/24/2019  . Influenza Nasal, Unspecified formulation 05/05/2013  . Influenza Quad 0.5ml single-dose vial(Fluzone) 03/10/2012, 11/03/2012  . Influenza Tri 12/22/2010  . Influenza, injectable, MDCK, preservative free, quadrivalent(FlucelvaxPF) 11/29/2020  . MMR 08/24/1987, 09/20/1995  . Meningococcal ACWY vaccine, unspecified formulation 11/04/2004  . OPV 06/13/1986, 08/13/1986, 10/26/1986, 12/09/1987, 08/11/1991  . PPD Test 12/07/1988, 07/28/1991, 09/20/1995  . Pfizer-BioNTech COVID-19 Monovalent Vaccine mRNA 05/08/2019, 06/05/2019  . Rabies Immune Globulin 09/26/1999  . Rabies vaccine, unspecified formulation 09/26/1999, 09/29/1999, 10/03/1999, 10/10/1999, 10/24/1999  . Td (adult), 2 Lf tetanus toxoid, preservative free, adsorbed (TDVAX) 07/18/1998  . Tdap 03/10/2012, 01/16/2013   Lab Results  Component Value Date   WBC 5.1 06/08/2022   Hemoglobin 13.1 06/08/2022   Hematocrit 39.0 06/08/2022   MCV 84 06/08/2022   Platelet Count 299 06/08/2022   Lab Results  Component Value Date   Creatinine 0.93 06/07/2023   BUN 16 06/07/2023   Sodium 133 (L) 06/07/2023   Potassium 4.0 06/07/2023   Chloride 99 06/07/2023   CO2 19 (L) 06/07/2023   Lab Results  Component Value Date   ALT (SGPT) 36 06/07/2023   AST 23 06/07/2023   Alkaline Phosphatase 230 (H) 06/07/2023   Total Bilirubin 0.3 06/07/2023   Lab Results  Component Value Date   Hemoglobin A1c 14.9 (H) 06/07/2023   No components found for: [facility] Lab Results  Component Value Date   TSH 3.590 11/24/2019    Review of  Systems is complete and negative except as noted.  BP 120/82 (BP Location: Right Upper Arm, Patient Position: Sitting)   Pulse 80   Resp 16   Ht 6' (1.829 m)   Wt 274 lb 12.8 oz (124.6 kg)   SpO2 97%   BMI 37.27 kg/m  General:  Well Developed, Well Nourished, No distress.  Very pleasant 37 AAM in NAD Head, Eyes, Ears, Nose, Throat: Normocephalic, atraumatic, PERRLA, Conjunctiva normal, Bilateral EAC and TM normal, Nares normal, Oropharynx moist and clear.   Neck:  Supple with normal range of motion, No lymphadenopathy.    Cardiovascular:  S1S2, regular, rate, and rhythm without murmurs, gallops, or rubs Lungs:  Clear to auscultation with normal effort Abdomen:  Bowel sounds active, soft, non-tender, non-distended, no hepatosplenomegaly or masses.   Skin:  No Focal Rashes.   Extremities:  No clubbing, cyanosis, or edema.   Neurologic:  No focal deficits, normal DTRs.  A&O x4 GU: pt declined need for exam  Prostate deferred Hem deferred  Diabetic foot exam:  Left: Monofilament test: Sensation normal  Pulses: normal and present  Skin: Normal and no erythema, no cyanosis or pallor  Other findings: none Right: Monofilament test: Sensation normal  Pulses: normal and present  Skin: Normal and no erythema, no cyanosis or pallor   Other findings: none Exam performed with shoes and socks removed.     Assessment 1. Annual physical exam   2. Screening for deficiency anemia   3. Uncontrolled type 2 diabetes mellitus with hyperglycemia (*)   4. Mixed hyperlipidemia   5. Hypertension associated with type 2 diabetes mellitus (*)   6. Need for vaccination      Plan 1-2, 6 Health maintenance/health promotion issues appropriate to age reviewed. Immunizations- Risks and benefits of the Pneumococcal vaccine have been discussed with the patient/care giver.  Patient/care giver concerns were answered to their satisfaction.  Signs and symptoms of adverse effects and when to seek medical  attention if adverse effects occur were discussed with the patient/care giver. Declines Tdap Recommend healthy diet and regular exercise. Recommend multivitamin daily. Cardiovascular screening with blood pressure and blood work (CMP, Lipid panel). Diabetes screening with fasting glucose in those patients who are not diabetic. Depression Screening/Awareness.    Fasting blood work pending: CBC, CMP, Lipid panel and A1c Recommend monthly self testicular exams PSA annually starting at age 74 Colonoscopy screening at age 69  3 Labs pending Continue med regimen Diabetes Goals: Sugar between 70-110. A1C less then 7% Eat properly with low carborhydrate diet. Exercise 3-4 times weekly for 45 mins.  Eye exam yearly to check the small vessels for diabetes complications. Check feet every morning and night for wounds.  Blood pressure goal <130/80 LDL (bad cholesterol) goal 70-100 HDL (good cholesterol) goal >45 Triglycerides (fatty cholesterol) < 150 consistently take your medications  4-5 Hypertension/HLD Continue medications as prescribed. 81 mg ASA daily No added salt/ heart healthy diet. Discussed ways to improve medication adherence. Encouraged/maintain heart healthy diet and active lifestyle with regular exercise program within physical limitations.  Goal is 30 5/7 days.   Follow up in about 6 months (around 03/08/2024) for F/U with labs. Orders Placed This Encounter  Procedures  . Pneumococcal conjugate vaccine 20-valent  . Lipid Panel  . Hemoglobin A1c  . Comprehensive Metabolic Panel  . CBC And Differential     Patient's Medications       * Accurate as of September 06, 2023  3:48 PM. Reflects encounter med changes as of last refresh          Continued Medications      Instructions  acetaminophen  500 mg tablet Commonly known as: TYLENOL   500 mg, Every 6 hours as needed   atorvastatin  80 mg tablet Commonly known as: LIPITOR  80 mg, Oral, At bedtime   BD PEN  NEEDLE NANO 2ND GEN 32G X 4 MM Misc Generic drug: Insulin  Pen Needle  1 each, Does not apply, 4 times a day   carvedilol 10 mg 24 hr capsule Commonly known as: COREG CR  10 mg, Oral, Daily   DEXCOM G7 SENSOR Misc  1 each, Does not apply, Every 10 days   fenofibrate  160 mg tablet Commonly known as: TRIGLIDE   160 mg, Oral, Daily   HUMALOG KWIKPEN 200 UNIT/ML injection Generic drug: U-200 insulin  lispro (HUMALOG KWIKPEN)  10 Units, Subcutaneous, With meals   icosapent  ethyl 1 g capsule Commonly known as: VASCEPA   2 g, Oral, 2 times a day   LANTUS  SOLOSTAR 100 UNIT/ML Sopn  44 Units, Subcutaneous, Daily   lisinopril  10 mg tablet Commonly known as: PRINIVIL ,ZESTRIL   10 mg, Oral, Daily   metFORMIN  ER  500 mg 24 hr tablet Commonly known as: GLUCOPHAGE -XR  2,000 mg, Oral, With breakfast                [1] Allergies Allergen Reactions  . Pollen Extract Itching  [2] Outpatient Medications Marked as Taking for the 09/06/23 encounter (Annual Physical) with Lyle Jenkins Setters, NP  Medication Sig Dispense Refill  . acetaminophen  (TYLENOL ) 500 mg tablet Take one tablet (500 mg dose) by mouth every 6 (six) hours as needed for Pain.    . atorvastatin  (LIPITOR) 80 mg tablet Take one tablet (80 mg dose) by mouth at bedtime. 30 tablet 5  . carvedilol (COREG CR) 10 mg 24 hr capsule Take one capsule (10 mg dose) by mouth daily. 90 capsule 1  . Continuous Glucose Sensor (DEXCOM G7 SENSOR) MISC 1 each by Does not apply route every 10 days. 3 each 5  . fenofibrate  (TRIGLIDE ) 160 mg tablet Take one tablet (160 mg dose) by mouth daily. 30 tablet 5  . icosapent  ethyl (VASCEPA ) 1 g capsule Take two capsules (2 g dose) by mouth 2 (two) times daily. 120 capsule 5  . Insulin  Glargine (LANTUS  SOLOSTAR) 100 UNIT/ML SOPN Inject forty four Units into the skin daily. 5 each 5  . Insulin  Pen Needle (BD PEN NEEDLE NANO 2ND GEN) 32G X 4 MM MISC one each by Does not apply route 4 (four) times daily.  150 each 5  . lisinopril  (PRINIVIL ,ZESTRIL ) 10 mg tablet Take one tablet (10 mg dose) by mouth daily. 30 tablet 5  . metFORMIN  ER (GLUCOPHAGE -XR) 500 mg 24 hr tablet Take four tablets (2,000 mg dose) by mouth with breakfast. 120 tablet 5  . U-200 insulin  lispro, HUMALOG KWIKPEN, (HUMALOG KWIKPEN) 200 Units/mL injection Inject ten Units into the skin 3 (three) times daily with meals. 6 mL 5  [3] Social History Socioeconomic History  . Marital status: Legally Separated  Occupational History  . Occupation: employed   Tobacco Use  . Smoking status: Never  . Smokeless tobacco: Never  Vaping Use  . Vaping status: Never Used  Substance and Sexual Activity  . Alcohol use: Not Currently    Comment: socially  . Drug use: No  *Some images could not be shown.

## 2023-11-25 ENCOUNTER — Encounter: Payer: Self-pay | Admitting: *Deleted

## 2024-01-15 ENCOUNTER — Ambulatory Visit (INDEPENDENT_AMBULATORY_CARE_PROVIDER_SITE_OTHER)

## 2024-01-15 ENCOUNTER — Encounter (HOSPITAL_COMMUNITY): Payer: Self-pay

## 2024-01-15 ENCOUNTER — Ambulatory Visit (HOSPITAL_COMMUNITY): Admission: EM | Admit: 2024-01-15 | Discharge: 2024-01-15 | Disposition: A | Discharge status: Home / Self Care

## 2024-01-15 DIAGNOSIS — M25572 Pain in left ankle and joints of left foot: Secondary | ICD-10-CM

## 2024-01-15 DIAGNOSIS — S93402A Sprain of unspecified ligament of left ankle, initial encounter: Secondary | ICD-10-CM | POA: Diagnosis not present

## 2024-01-15 MED ORDER — IBUPROFEN 800 MG PO TABS
ORAL_TABLET | ORAL | Status: AC
Start: 2024-01-15 — End: 2024-01-15
  Filled 2024-01-15: qty 1

## 2024-01-15 MED ORDER — IBUPROFEN 800 MG PO TABS
800.0000 mg | ORAL_TABLET | Freq: Once | ORAL | Status: AC
  Administered 2024-01-15: 800 mg via ORAL

## 2024-01-15 MED ORDER — IBUPROFEN 600 MG PO TABS: 600.0000 mg | ORAL_TABLET | Freq: Four times a day (QID) | ORAL | 0 refills | Status: AC | PRN

## 2024-01-15 NOTE — ED Provider Notes (Signed)
 [facility]    CSN: [id number] Arrival date & time: 01/15/24  1732      History   Chief Complaint Chief Complaint  Patient presents with   Ankle Injury    HPI Douglas [name]. is a 38 y.o. male.   Douglas [name] presents today with complaint of left ankle pain and swelling that began today.  He reports that he was playing flag football when he slipped and his ankle inverted.  He reports that he is now having pain across the anterior ankle as well as the lateral malleolus.  He reports swelling over the lateral malleolus.  He states that the pain is 7 out of 10, and he is unable to describe the quality.  It is worse when bearing weight, and he is having a difficult time walking.  He also reports that all of his toes feel numb and tingly.  After the injury he applied ice x 2 and purchased an ankle brace which she has been wearing.  He has not taken any medication for his pain.  The history is provided by the patient.  Ankle Injury This is a new problem.    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   Abnormal ECG 09/05/2019   Atrial fibrillation with RVR (HCC) 08/26/2019   Type 2 diabetes mellitus without complication 08/25/2019   Hyperlipidemia 08/25/2019   History of pancreatitis 11/28/2017   Hypertriglyceridemia 11/28/2017   Peripancreatic fluid collection 11/28/2017   Rectal bleeding 11/28/2017   Acute pancreatitis  09/16/2017   Abdominal pain 09/16/2017   Leukocytosis 09/16/2017   Hyponatremia 09/16/2017   Abnormal LFTs 09/16/2017   Obesity, Class III, BMI 40-49.9 (morbid obesity) (HCC) 09/16/2017   Hypertension 09/16/2017   Pancreatitis 09/16/2017   Acute pancreatitis without infection or necrosis 09/16/2017   Metabolic syndrome 01/01/2017    Past Surgical History:  Procedure Laterality Date   SHOULDER SURGERY Right    July 2023   TONSILLECTOMY     WISDOM TOOTH EXTRACTION         Home Medications    Prior to Admission medications   Medication Sig Start Date End Date Taking? Authorizing Provider  ibuprofen  (ADVIL ) 600 MG tablet Take 1 tablet (600 mg total) by mouth every 6 (six) hours as needed. 01/15/24  Yes Leatrice Vernell HERO, NP  atorvastatin  (LIPITOR) 80 MG tablet Take 1 tablet (80 mg total) by mouth daily at 6 PM. 05/13/22   Gherghe, Costin M, MD  insulin  glargine (LANTUS ) 100 UNIT/ML Solostar Pen Inject 30-40 Units into the skin daily. 07/29/23   Cheryle Page, MD  metFORMIN  (GLUCOPHAGE ) 500 MG tablet Take 3 tablets (1,500 mg total) by mouth daily. 07/29/23 08/28/23  Cheryle Page, MD  ondansetron  (ZOFRAN ) 4 MG tablet Take 1 tablet (4 mg total) by mouth every 6 (six) hours as needed for nausea. 07/29/23   Cheryle Page, MD    Family History Family History  Problem  Relation Age of Onset   Hypercholesterolemia Mother    Hypertension Mother    Diabetes Father    Colon cancer Neg Hx    Esophageal cancer Neg Hx    Inflammatory bowel disease Neg Hx    Liver disease Neg Hx    Pancreatic cancer Neg Hx    Rectal cancer Neg Hx    Stomach cancer Neg Hx     Social History Social History   Tobacco Use   Smoking status: Former    Current packs/day: 0.00    Types: Cigarettes    Quit date: 02/17/2003    Years since quitting: 20.9   Smokeless tobacco: Never   Tobacco comments:    teenage years  Vaping Use   Vaping status: Never Used  Substance Use Topics   Alcohol use: Yes     Comment: a drink 2-3 x a year   Drug use: No     Allergies   Patient has no known allergies.   Review of Systems Review of Systems  Constitutional: Negative.   Musculoskeletal:  Positive for arthralgias, gait problem and joint swelling.  Skin: Negative.   Neurological:  Positive for numbness. Negative for weakness.  Hematological:  Does not bruise/bleed easily.     Physical Exam Triage Vital Signs ED Triage Vitals  Encounter Vitals Group     BP 01/15/24 1800 135/88     Girls Systolic BP Percentile --      Girls Diastolic BP Percentile --      Boys Systolic BP Percentile --      Boys Diastolic BP Percentile --      Pulse Rate 01/15/24 1800 99     Resp 01/15/24 1800 16     Temp 01/15/24 1800 98.6 F (37 C)     Temp Source 01/15/24 1800 Oral     SpO2 01/15/24 1800 93 %     Weight --      Height --      Head Circumference --      Peak Flow --      Pain Score 01/15/24 1759 7     Pain Loc --      Pain Education --      Exclude from Growth Chart --    No data found.  Updated Vital Signs BP 135/88 (BP Location: Left Arm)   Pulse 99   Temp 98.6 F (37 C) (Oral)   Resp 16   SpO2 93%   Visual Acuity Right Eye Distance:   Left Eye Distance:   Bilateral Distance:    Right Eye Near:   Left Eye Near:    Bilateral Near:     Physical Exam Vitals and nursing note reviewed.  Constitutional:      General: He is not in acute distress.    Appearance: Normal appearance. He is normal weight. He is not toxic-appearing.  HENT:     Mouth/Throat:     Tonsils: 1+ on the right. 1+ on the left.  Eyes:     Conjunctiva/sclera: Conjunctivae normal.  Cardiovascular:     Rate and Rhythm: Normal rate and regular rhythm.     Heart sounds: Normal heart sounds.  Pulmonary:     Effort: Pulmonary effort is normal.     Breath sounds: Normal breath sounds and air entry.  Musculoskeletal:     Lumbar back: Decreased range of motion: Guarded due to pain. Negative right straight leg  raise test and negative left straight leg raise test.  Left ankle: Swelling (Over lateral malleolus) present. No deformity, ecchymosis or lacerations. Tenderness present over the lateral malleolus and ATF ligament. No medial malleolus tenderness. Normal range of motion. Normal pulse.     Left Achilles Tendon: Normal.  Lymphadenopathy:     Cervical:     Right cervical: No posterior cervical adenopathy.    Left cervical: No posterior cervical adenopathy.  Skin:    General: Skin is warm and dry.     Findings: No bruising.  Neurological:     Mental Status: He is alert and oriented to person, place, and time.  Psychiatric:        Mood and Affect: Mood normal.        Behavior: Behavior normal.      UC Treatments / Results  Labs (all labs ordered are listed, but only abnormal results are displayed) Labs Reviewed - No data to display  EKG   Radiology DG Ankle Complete Left Result Date: 01/15/2024 CLINICAL DATA:  Twisted ankle playing football.  Lateral-sided pain. EXAM: LEFT ANKLE COMPLETE - 3+ VIEW COMPARISON:  None Available. FINDINGS: No fracture or bone lesion. Ankle mortise is normally spaced and aligned. Mild lateral soft tissue swelling. IMPRESSION: No fracture or dislocation Electronically Signed   By: Alm Parkins M.D.   On: 01/15/2024 18:43    Procedures Procedures (including critical care time)  Medications Ordered in UC Medications  ibuprofen  (ADVIL ) tablet 800 mg (800 mg Oral Given 01/15/24 1827)    Initial Impression / Assessment and Plan / UC Course  I have reviewed the triage vital signs and the nursing notes.  Pertinent labs & imaging results that were available during my care of the patient were reviewed by me and considered in my medical decision making (see chart for details).     Anterior and lateral ankle pain after rolling ankle during football.  X-ray was negative for fracture.  Patient will continue symptomatic management with rest, ice, elevation,  and ibuprofen  600 mg every 6 hours.  A cam boot was provided for stability and comfort.  As pain subsides he may weight-bear as tolerated.  He will continue to monitor symptoms at home.  If worsening of symptoms or no improvement within 2 weeks he will follow-up with orthopedics for further evaluation. Final Clinical Impressions(s) / UC Diagnoses   Final diagnoses:  Acute left ankle pain  Sprain of left ankle, unspecified ligament, initial encounter     Discharge Instructions        What is an Ankle Sprain? An ankle sprain occurs when the ligaments that support the ankle are stretched or torn, usually due to twisting or rolling the ankle.  Treatment Instructions: - Rest: Limit activities that cause pain or put stress on your ankle. Avoid running, jumping, or walking long distances until pain and swelling improve. - Ice: Apply an ice pack to the injured ankle for 15-20 minutes every 2-3 hours during the first 48-72 hours. Always place a cloth between the ice and your skin to prevent frostbite. - Elevation: Keep your ankle elevated above the level of your heart as much as possible, especially in the first few days. This helps reduce swelling. - Ibuprofen : Take ibuprofen  as directed for pain and swelling, unless you have a contraindication. Take with food to avoid stomach upset. - Weight Bearing as Tolerated (WBAT): You may put weight on your ankle as tolerated. Use crutches if needed for comfort. - CAM Boot: Wear the CAM (Controlled Ankle Motion) boot as instructed to support  and protect your ankle while it heals. Only remove the boot for hygiene or as directed.  When to Follow Up with Orthopedics: - Most mild to moderate ankle sprains can be managed conservatively and do not require routine orthopedic follow-up.  - Follow up with orthopedics if:   - You are unable to bear weight after several days   - Pain or swelling is not improving after 1-2 weeks   - You have persistent instability  or difficulty walking   - There is concern for a more severe injury (e.g., severe pain, deformity, or inability to move the ankle)  When to Seek Immediate Care: - Severe pain or swelling - Numbness, tingling, or inability to move your toes - Blue or pale toes - Signs of infection (fever, redness, warmth, drainage)  General Tips: - Avoid activities that may re-injure your ankle until fully healed. - Begin gentle range-of-motion exercises as pain allows. - Keep the boot clean and dry.  Summary: Ankle sprains usually heal well with rest, ice, elevation, anti-inflammatory medication, and support with a CAM boot. You may bear weight as tolerated. Follow up with orthopedics if you are not improving after 1-2 weeks, or sooner if you have concerns about the severity of your injury.      ED Prescriptions     Medication Sig Dispense Auth. Provider   ibuprofen  (ADVIL ) 600 MG tablet Take 1 tablet (600 mg total) by mouth every 6 (six) hours as needed. 30 tablet Leatrice Vernell HERO, NP      PDMP not reviewed this encounter.   Leatrice Vernell HERO, NP 01/15/24 1929

## 2024-01-15 NOTE — ED Triage Notes (Signed)
 Patient reports that he was playing flag football on wet grass today and rolled his left ankle. Patient has swelling and pain. Patient states his toes are tingling.  Patient has been icing his left ankle and wearing a left ankle brace.

## 2024-01-15 NOTE — Discharge Instructions (Addendum)
   What is an Ankle Sprain? An ankle sprain occurs when the ligaments that support the ankle are stretched or torn, usually due to twisting or rolling the ankle.  Treatment Instructions: - Rest: Limit activities that cause pain or put stress on your ankle. Avoid running, jumping, or walking long distances until pain and swelling improve. - Ice: Apply an ice pack to the injured ankle for 15-20 minutes every 2-3 hours during the first 48-72 hours. Always place a cloth between the ice and your skin to prevent frostbite. - Elevation: Keep your ankle elevated above the level of your heart as much as possible, especially in the first few days. This helps reduce swelling. - Ibuprofen : Take ibuprofen  as directed for pain and swelling, unless you have a contraindication. Take with food to avoid stomach upset. - Weight Bearing as Tolerated (WBAT): You may put weight on your ankle as tolerated. Use crutches if needed for comfort. - CAM Boot: Wear the CAM (Controlled Ankle Motion) boot as instructed to support and protect your ankle while it heals. Only remove the boot for hygiene or as directed.  When to Follow Up with Orthopedics: - Most mild to moderate ankle sprains can be managed conservatively and do not require routine orthopedic follow-up.  - Follow up with orthopedics if:   - You are unable to bear weight after several days   - Pain or swelling is not improving after 1-2 weeks   - You have persistent instability or difficulty walking   - There is concern for a more severe injury (e.g., severe pain, deformity, or inability to move the ankle)  When to Seek Immediate Care: - Severe pain or swelling - Numbness, tingling, or inability to move your toes - Blue or pale toes - Signs of infection (fever, redness, warmth, drainage)  General Tips: - Avoid activities that may re-injure your ankle until fully healed. - Begin gentle range-of-motion exercises as pain allows. - Keep the boot clean and  dry.  Summary: Ankle sprains usually heal well with rest, ice, elevation, anti-inflammatory medication, and support with a CAM boot. You may bear weight as tolerated. Follow up with orthopedics if you are not improving after 1-2 weeks, or sooner if you have concerns about the severity of your injury.

## 2024-01-21 ENCOUNTER — Emergency Department (HOSPITAL_BASED_OUTPATIENT_CLINIC_OR_DEPARTMENT_OTHER)

## 2024-01-21 ENCOUNTER — Emergency Department (HOSPITAL_BASED_OUTPATIENT_CLINIC_OR_DEPARTMENT_OTHER)
Admission: EM | Admit: 2024-01-21 | Discharge: 2024-01-21 | Disposition: A | Discharge status: Home / Self Care | Attending: Emergency Medicine | Admitting: Emergency Medicine

## 2024-01-21 ENCOUNTER — Other Ambulatory Visit: Payer: Self-pay

## 2024-01-21 DIAGNOSIS — M79662 Pain in left lower leg: Secondary | ICD-10-CM | POA: Insufficient documentation

## 2024-01-21 DIAGNOSIS — W010XXA Fall on same level from slipping, tripping and stumbling without subsequent striking against object, initial encounter: Secondary | ICD-10-CM | POA: Insufficient documentation

## 2024-01-21 DIAGNOSIS — M79605 Pain in left leg: Secondary | ICD-10-CM

## 2024-01-21 DIAGNOSIS — W19XXXD Unspecified fall, subsequent encounter: Secondary | ICD-10-CM

## 2024-01-21 DIAGNOSIS — Y9361 Activity, american tackle football: Secondary | ICD-10-CM | POA: Insufficient documentation

## 2024-01-21 MED ORDER — KETOROLAC TROMETHAMINE 30 MG/ML IJ SOLN
30.0000 mg | Freq: Once | INTRAMUSCULAR | Status: AC
  Administered 2024-01-21: 30 mg via INTRAMUSCULAR
  Filled 2024-01-21: qty 1

## 2024-01-21 MED ORDER — OXYCODONE-ACETAMINOPHEN 5-325 MG PO TABS: 1.0000 | ORAL_TABLET | Freq: Four times a day (QID) | ORAL | 0 refills | Status: AC | PRN

## 2024-01-21 NOTE — ED Provider Notes (Signed)
 [place] [facility] AT [facility] Provider Note   CSN: [id number] Arrival date & time: 01/21/24  2034     Patient presents with: Ankle Pain   Douglas [name]. is a 38 y.o. male left leg pain.  Slipped and fell while playing flag football last Saturday.  Seen at urgent care had an x-ray of his ankle performed which did not show any significant abnormality.  He was placed in a walking boot and told to take ibuprofen .  He states he is having worsening pain which goes up the posterior aspect from his Achilles tendon into his posterior calf.  He feels like there is some swelling in his leg.  No numbness or weakness.  Been trying to stay off the leg.  Taking ibuprofen  and Tylenol  at home without relief.   HPI     Prior to Admission medications   Medication Sig Start Date End Date Taking? Authorizing Provider  oxyCODONE -acetaminophen  (PERCOCET/ROXICET) 5-325 MG tablet Take 1 tablet by mouth every 6 (six) hours as needed for severe pain (pain score 7-10). 01/21/24  Yes Andrew Blasius A, PA-C  atorvastatin  (LIPITOR) 80 MG tablet Take 1 tablet (80 mg total) by mouth daily at 6 PM. 05/13/22   Gherghe, Costin M, MD  ibuprofen  (ADVIL ) 600 MG tablet Take 1 tablet (600 mg total) by mouth every 6 (six) hours as needed. 01/15/24   Leatrice Vernell HERO, NP  insulin  glargine (LANTUS ) 100 UNIT/ML Solostar Pen Inject 30-40 Units into the skin daily. 07/29/23   Cheryle Page, MD  metFORMIN  (GLUCOPHAGE ) 500 MG tablet Take 3 tablets (1,500 mg total) by mouth daily. 07/29/23 08/28/23  Cheryle Page, MD  ondansetron  (ZOFRAN ) 4 MG tablet Take 1 tablet (4 mg total) by mouth every 6 (six) hours as needed for nausea. 07/29/23   Cheryle Page, MD    Allergies: Patient has no known allergies.    Review of Systems  Constitutional: Negative.   HENT: Negative.    Respiratory: Negative.    Cardiovascular: Negative.   Gastrointestinal: Negative.   Genitourinary: Negative.    Musculoskeletal:        Left lower extremity pain  Skin: Negative.   Neurological: Negative.   All other systems reviewed and are negative.   Updated Vital Signs BP 130/89   Pulse 100   Temp 97.8 F (36.6 C) (Temporal)   Resp 19   Ht 6' (1.829 m)   SpO2 99%   BMI 34.71 kg/m   Physical Exam Vitals and nursing note reviewed.  Constitutional:      General: He is not in acute distress.    Appearance: He is well-developed. He is not ill-appearing or diaphoretic.  HENT:     Head: Atraumatic.  Eyes:     Pupils: Pupils are equal, round, and reactive to light.  Cardiovascular:     Rate and Rhythm: Normal rate and regular rhythm.     Pulses: Normal pulses.          Radial pulses are 2+ on the right side and 2+ on the left side.     Heart sounds: Normal heart sounds.  Pulmonary:     Effort: Pulmonary effort is normal. No respiratory distress.  Abdominal:     General: There is no distension.     Palpations: Abdomen is soft.  Musculoskeletal:        General: Normal range of motion.     Cervical back: Normal range of motion and neck supple.     Comments:  Soft tissue swelling posterior calf, left lateral malleolus.  Difficulty with plantarflexion dorsiflexion left lower extremity.  Nontender foot.  Calf soft, diffuse posterior calf tenderness.  No erythema or warmth.  Mild tenderness proximal fibular head on left.  Nontender knee, femur tenderness over left Achilles tendon without palpable defect  Skin:    General: Skin is warm and dry.     Capillary Refill: Capillary refill takes less than 2 seconds.     Comments: Soft tissue swelling left lateral malleolus at left ankle  Neurological:     General: No focal deficit present.     Mental Status: He is alert and oriented to person, place, and time.     Cranial Nerves: Cranial nerves 2-12 are intact.     Sensory: Sensation is intact.     Comments: Limp     (all labs ordered are listed, but only abnormal results are  displayed) Labs Reviewed - No data to display  EKG: None  Radiology: DG Tibia/Fibula Left Result Date: 01/21/2024 EXAM: XR TIBIA AND FIBULA, LEFT, 01/21/2024 10:50:00 PM COMPARISON: X-ray left ankle 01/15/2024. CLINICAL HISTORY: pain, injury FINDINGS: BONES AND JOINTS: No acute fracture. No malalignment. SOFT TISSUES: Mild soft tissue edema in the distal lower leg. No retained radiopaque foreign body. IMPRESSION: 1. No acute fracture or dislocation. 2. No retained radiopaque foreign body. 3. Mild soft tissue edema in the distal lower leg. Electronically signed by: Morgane Naveau MD 01/21/2024 11:01 PM EST RP Workstation: [id number]     Procedures   Medications Ordered in the ED  ketorolac  (TORADOL ) 30 MG/ML injection 30 mg (30 mg Intramuscular Given 01/21/24 259)    38 year old here for evaluation of left lower extremity pain after inversion injury playing football last week.  Initially seen at urgent care had x-ray of ankle performed thought likely sprain/strain was given walking boot and told to take ibuprofen .  States he continues to have pain now more so located over his Achilles tendon into his posterior calf.  He has been using a walking boot however has been trying to stay off the leg.  Here he has no obvious infectious symptoms.  He is neurovascularly intact.  Does have some tenderness to his proximal fibular head.  Nontender foot.  Will plan on x-ray tib-fib.  Pain management here.  Imaging personally viewed interpreted:  X-ray left tib-fib shows no fracture, dislocation however does show some mild soft tissue edema distal lower leg  Discussed results with patient.  Prefers to not be in permanent splint and would like to keep the walking boot.  Discussed crutches, trying to stay off the leg given possible Achilles tendon injury.  Ultrasound not available today there is also concern for possible DVT given trying to stay off the leg given his diffuse pain to his calf.  Shared decision  making for Lovenox , will hold off at this time.  He has appointment scheduled for ultrasound tomorrow here in the ED.  He will return for new or worsening symptoms.  Low suspicion for ischemia, bacterial infectious process, septic joint, gout, hemarthrosis, necrotizing infection, PAD/claudication, occult fracture, dislocation.                                     Medical Decision Making Amount and/or Complexity of Data Reviewed External Data Reviewed: labs, radiology and notes. Radiology: ordered and independent interpretation performed. Decision-making details documented in ED Course.  Risk OTC drugs. Prescription  drug management. Decision regarding hospitalization. Diagnosis or treatment significantly limited by social determinants of health.       Final diagnoses:  Pain of left lower extremity  Fall, subsequent encounter    ED Discharge Orders          Ordered    US  Venous Img Upper Uni Left        01/21/24 2230    oxyCODONE -acetaminophen  (PERCOCET/ROXICET) 5-325 MG tablet  Every 6 hours PRN        01/21/24 2318               Sencere Symonette A, PA-C 01/21/24 2324    Tonia Chew, MD 01/22/24 0020

## 2024-01-21 NOTE — ED Triage Notes (Signed)
 Pt POV reporting persistent L ankle pain after slipping and falling while playing flag football last Saturday, seen at [facility] after incident, xray neg, dx sprain, wearing boot and taking pain meds without improvement of pain.

## 2024-01-21 NOTE — Discharge Instructions (Signed)
 It was a pleasure taking care of you here today  As we discussed you have an appointment tomorrow for ultrasound to rule out a blood clot.  I have written you for a short course of pain medicine.  Try not to walk on the leg.  Make sure to follow-up with orthopedics if your ultrasound does not show a blood clot.  Do not drive or operate heavy machinery while taking the narcotic pain medicine as it may make you sleepy.  Follow-up outpatient, return for new or worsening symptoms

## 2024-01-22 ENCOUNTER — Emergency Department (HOSPITAL_BASED_OUTPATIENT_CLINIC_OR_DEPARTMENT_OTHER): Admit: 2024-01-22 | Discharge: 2024-01-22 | Attending: Emergency Medicine | Admitting: Emergency Medicine

## 2024-01-22 ENCOUNTER — Other Ambulatory Visit (HOSPITAL_BASED_OUTPATIENT_CLINIC_OR_DEPARTMENT_OTHER): Payer: Self-pay | Admitting: Physician Assistant

## 2024-01-22 DIAGNOSIS — M79605 Pain in left leg: Secondary | ICD-10-CM

## 2024-01-22 DIAGNOSIS — W19XXXD Unspecified fall, subsequent encounter: Secondary | ICD-10-CM

## 2024-01-22 NOTE — ED Provider Notes (Signed)
 Patient here for DVT ultrasound results.  Patient was seen yesterday in the emergency department.  Results will be attached below.  I notified patient of results.  Patient expressed full understanding.      Narrative & Impression  CLINICAL DATA:  Left leg pain.   EXAM: LEFT LOWER EXTREMITY VENOUS DOPPLER ULTRASOUND   TECHNIQUE: Gray-scale sonography with graded compression, as well as color Doppler and duplex ultrasound were performed to evaluate the lower extremity deep venous systems from the level of the common femoral vein and including the common femoral, femoral, profunda femoral, popliteal and calf veins including the posterior tibial, peroneal and gastrocnemius veins when visible. Spectral Doppler was utilized to evaluate flow at rest and with distal augmentation maneuvers in the common femoral, femoral and popliteal veins.   COMPARISON:  None Available.   FINDINGS: Contralateral Common Femoral Vein: Respiratory phasicity is normal and symmetric with the symptomatic side. No evidence of thrombus. Normal compressibility.   Common Femoral Vein: No evidence of thrombus. Normal compressibility, respiratory phasicity and response to augmentation.   Saphenofemoral Junction: No evidence of thrombus. Normal compressibility and flow on color Doppler imaging.   Profunda Femoral Vein: No evidence of thrombus. Normal compressibility and flow on color Doppler imaging.   Femoral Vein: No evidence of thrombus. Normal compressibility, respiratory phasicity and response to augmentation.   Popliteal Vein: No evidence of thrombus. Normal compressibility, respiratory phasicity and response to augmentation.   Calf Veins: No evidence of thrombus. Normal compressibility and flow on color Doppler imaging.   Other Findings:  Left GSV demonstrates normal compressibility.   IMPRESSION: Negative for deep venous thrombosis in left lower extremity.     Electronically Signed   By: Juliene Balder M.D.   On: 01/22/2024 12:28     Theotis Cameron HERO, PA-C 01/22/24 1244    Charlyn Sora, MD 01/23/24 [phone]

## 2024-01-27 ENCOUNTER — Encounter: Admitting: *Deleted

## 2024-01-27 DIAGNOSIS — Z006 Encounter for examination for normal comparison and control in clinical research program: Secondary | ICD-10-CM

## 2024-01-27 NOTE — Research (Addendum)
 Inclusion Yes No  Males, or nonpregnant (who do not plan to become pregnant) non lactating females, who are >=37 years of age at screening. [x]  []   Established diagnosis of SHTG and prior documented evidence (medical history) of fasting TG levels of >=880 mg/dL (>=89 mmol/L). 1840 05-10-22      492 05-12-2022 [x]   []   Fasting TG levels >=1,000 mg/dL (>=88.6 mmol/L) collected during the screening period, despite adherence to diet and lipid- and TG-lowering medications (fibrate with or without omega-3 fatty acid, unless documented as intolerant as determined by the Investigator). _________ []          Waiting on results                                                    []   Documented evidence of at least 2 prior AP events  not attributed to other etiologies (eg, gallstones, alcohol), with at least 1 occurring within the last 12 months prior to screening.  [x]  []   Fasting LDL-C <=130 mg/dL (<=6.62 mmol/L) at screening._______ [] Waiting on results  []   Screening HbA1c <=9.0%. If a participant with diabetes is considered a screen failure based on the HbA1c criteria, the Investigator can optimize the anti-diabetic regimen and then rescreen the participant. _______ [] Waiting on results  []   [x]   If a participant has diabetes:   a. Must have no events of diabetic ketoacidosis, diabetic decompensation/hyperosmolar hyperglycemic nonketotic coma, recurrent infections, or hospitalization related to poor glycemic control within 24 weeks of the screening period (Screening visit).  b. Subjects with insulin -dependent diabetes must be on a stable insulin  regimen with no changes in basal insulin  of more than 10 units for at least 12 weeks from Day 1   [x]  []   Willing to follow diet counseling and maintain a stable low-fat diet  [x]  []   Participants must be on standard of care lipid and TG-lowering medications (fibrate with or without omega-3 fatty acid) per local guidelines (unless documented as intolerant, or a  treatment failure as determined by the Investigator, including an inability to safely administer or re-administer a specific drug because of fear, preference, genetic, clinical, or metabolic considerations, or a previous adverse reaction associated with, attributed to, or caused by specific drug is intolerance) prior to collection of qualifying TG levels.   If the participant has a medical history of clinical atherosclerotic cardiovascular disease (ASCVD) or elevated 10-year ASCVD risk (eg, >=7.5% per American Heart AHA/ACC risk calculator for participants >64 years of age or Framingham risk score calculator for participants under the age of 21), then the participant must be on appropriate lipid-lowering therapy as per local standard of care (ie, including moderate- to high-intensity statin, as indicated) unless documented as intolerant to statin therapy, prior to collection of qualifying TG levels. Plozasiran Injection Protocol   Intolerance to statin therapy is defined as one or more adverse effects associated with statin therapy which resolves or improves with dose reduction or discontinuation and can be classified as a complete inability to tolerate any dose of a statin or partial intolerance with inability to tolerate the dose necessary to achieve the patient-specific therapeutic objective. To classify a patient as having statin intolerance, a minimum of two statins should have been attempted, including at least one at the lowest approved daily dosage.  [x]  []    Participants using any of  the following medications must be on a stable regimen for the specified duration prior to collection of screening laboratory tests and for the duration of study participation (adjustment of the regimen is allowed if clinically indicated during the study):   Medication  Time on Stable Regimen Prior to Collection of Screening Visit Laboratory Tests   Lipid-lowering therapies (including statins)  >=4 weeks    Beta-blockers, thiazide diuretics  >=4 weeks   Anticoagulation therapy  >=4 weeks   Thyroid hormone replacement therapy  >=4 weeks   Testosterone replacement therapy  >=4 weeks   Oral estrogens, tamoxifen, raloxifene  >=4 weeks   Highly active antiretroviral therapy (HAART) for HIV  >=4 weeks   Proprotein convertase subtilisin/kexin type 9 (PCSK9) inhibitors  >=4 weeks   Diabetes mellitus medications  >=4 weeks   Fibrates  >=6 weeks   Retinoids  >=8 weeks   Atypical antipsychotics  >=12 weeks   GLP-1-RAs  >=12 weeks   Immunosuppressants  >=24 weeks   Inclisiran  >=24 weeks    [x]  []       Male participants of childbearing potential must agree to use a highly effective method of contraception during the study and for at least 90 days following the end of the study (EOS) or the last dose of IMP, whichever is later. Male participants must agree to use a condom  during the study and for at least 90 days following the EOS or the last dose of IMP, whichever is later. Participants must not donate sperm or eggs during the study and for at least 90 days following the EOS or last dose of IMP, whichever is later.  Male participants of childbearing potential on hormonal contraceptives must be stable on the medication for >1 menstrual cycles prior to Day 1 (V1).  [x]  []   Ability to understand and comply with study procedures and willing to provide written informed consent before performance of any study-specific procedures.  [x]  []   NOTE: All laboratory tests used for inclusion criteria may be repeated once and the repeat value may be used for determination of eligibility. Local laboratory testing may be permitted in limited circumstances and only with prior Sponsor approval.         EXCLUSION Yes No  Use of any hepatocyte-targeted siRNA that targets lipids and/or triglycerides within 365 days before Day 1 (except inclisiran, which is permitted). Administration of investigational drug and  inclisiran must be separated by at least 4 weeks.  []  [x]   Use of any other hepatocyte-targeted siRNA or antisense oligonucleotide molecule within 60 days or within 5 half-lives before Day 1 based on plasma PK, whichever is longer.  []  [x]   Acute pancreatitis <= 4 weeks prior to Randomization/Day 1.  []  [x]   Body mass index (BMI) >45 kg/m2.  []  [x]   Any planned bariatric surgery or similar procedures to induce weight loss during the period starting at consent through EOS.  []  [x]   History of major surgery within 12 weeks of Day 1 (V1) or planned major surgery during the study.  []  [x]   Planned coronary intervention (such as stent placement or heart bypass) during the study.  []  [x]    History of arterial revascularization within 16 weeks of screening.  []  [x]   History of acute coronary syndrome event within 24 weeks of screening.  []  [x]   Recent ASCVD event (eg, myocardial infarction or stroke) within 24 weeks of screening.  []  [x]   Recent unstable or symptomatic cardiac arrhythmia (including any associated medication changes) within 90  days of screening. Individuals with stable well-controlled atrial arrhythmias will be allowed to participate in the study.  []  [x]   History of pacemaker or automatic implantable cardioverter defibrillators implant within 30 days before screening.  []  [x]   [place]  Heart Association Class III-IV heart failure or last known ejection fraction of <30%.  []  [x]   Uncontrolled hypertension (seated systolic blood pressure >160 mmHg and/or diastolic blood pressure >100 mmHg) at screening; participant may be rescreened once hypertension is controlled. []  [x]   Known clinically significant blood dyscrasia.  []  [x]   Clinically significant abnormality in prothrombin time (PT), activated partial thromboplastin time (aPTT), or INR.  []  [x]   Untreated or inadequately treated hypothyroidism (thyroid stimulating hormone [TSH] >2.0 ULN) or hyperthyroidism; controlled thyroid disease  (permitted) requires normal TSH and stable therapy for at least 4 weeks.  []  [x]   Current diagnosis of nephrotic syndrome.  []  [x]   Chronic kidney disease, defined by an estimated glomerular filtration rate (eGFR) <20 mL/min/1.73 m2 using the Modification of Diet in Renal Disease (MDRD) equation  []  [x]   Spot urine protein/spot urine creatinine ratio >3 grams per day.  []  [x]   Liver disease defined as cirrhosis or Child-Pugh Class B and C, or ALT or AST >2.5 ULN at screening.  []  [x]   Biliary obstruction or hyperbilirubinemia (ie, total bilirubin >2 ULN, except with a documented diagnosis of Gilbert's disease) at screening.   []  [x]   Systemic use of moderate- to high-dose corticosteroids or anabolic steroids within 4 weeks prior to Day 1 (V1) or planned use during the study with the following exceptions:   a. A stable dose of testosterone replacement therapy >=4 weeks before screening is permitted for a documented history of hypogonadism [low testosterone] or in participants that are on therapy for gender transition as verified in participant health records  b. Non systemic steroids: inhaled/nasal, topical (including ophthalmic), and local injections (intra-articular, intra-lesional, and intradermal) are acceptable.  c. Systemic steroids: low-dose systemic  steroids (<10 mg prednisone, or daily equivalent) are acceptable. []  [x]   Plasma apheresis within 4 weeks prior to Day 1 (V1) or planned during the study.  []  [x]   Blood donation of 50 to 499 mL within 4 weeks of screening laboratory collection or >499 mL within 8 weeks of screening laboratory collection.  []  [x]   Seropositive for hepatitis B virus (HBV) (hepatitis B surface antigen [HbsAg]+), or hepatitis C virus (HCV) (HCV seropositivity requires positive test for antibodies confirmed with positive test for HCV RNA).  []  [x]   History of malignancy within the last 2 years before the date of consent requiring systemic treatment except for  adequately treated basal cell carcinoma, squamous cell skin cancer, superficial bladder tumors, or in situ cervical cancer. Currently receiving systemic cancer treatment(s) or, in the Investigator's opinion, at risk of relapse for recent cancer.  []  [x]   Unwilling to limit alcohol consumption to within moderate limits for the duration of the study, as follows: not more than 14 units per week (1 unit=80 mL of wine, 200 mL of beer, or 25 mL of 40% alcohol).  []  [x]   Any concomitant medical, neurocognitive, or psychiatric condition or social situation or any other situation that, in the Investigator's judgment, would make it difficult to comply with protocol requirements or put the participant at additional safety risk.  []  [x]   Use of an investigational agent or device within 30 days or within 5 half-lives, based on PK, whichever is longer, prior to Day 1 (V1) or current participation in an interventional  investigational study. Participants previously exposed to plozasiran or zodasiran (ARO-ANG3) will require a washout period of at least 1 year from last dose.  []  [x]   Pregnant or breastfeeding participants or participants intending to become pregnant within 90 days after study completion or last dose of IMP whichever is later.  []  [x]   Active infection requiring systemic antiviral or antimicrobial therapy that will not be resolved prior to Day 1 (V1) or active Covid-19 infection with or without therapy that will not be resolved by Day 1 (V1).  []  [x]   Known hypersensitivity to the active substance or to any of the excipients of plozasiran or placebo.  []  [x]   NOTE: All laboratory tests used for exclusion criteria may be repeated once and the repeat value may bcircumstances and only with prior Sponsor approval.e used for determination of eligibility. Local laboratory testing may be permitted in limited       SHASTA Screening Visit Date of Visit  01-27-2024        Subject Number [id number]  During  this visit the following activities were completed:  [x]  Reading, Signing and Understanding the informed Consent       Time:  [x]  Diet and lifestyle counseling  [x]  Height, Weight, and Vital Signs  VS collected after patient has been resting for 5 minutes in a seated position (no crossed legs) Blood pressure in triplicate on same arm. VS obtained prior to any procedures  Height:6 ft Weight:286.44 Blood pressure:132/90 Heart Rate:86 Respiratory Rate:16 Oxygen Saturation:99% Temperature:36.7  [x]  Physical Exam  [x]  12 Lead ECG: ECG completed while patient in the semi-supine position after resting for 3 minutes and prior to any invasive procedures. Any abnormal and clinically significant ECGs, based on the Investigator's Medical judgment, should be repeated in triplicate approx 1 min apart.   [x]  Acute pancreatitis signs and symptoms counseling  [x]  Eligibility criteria  [x]  Demographics (type of healthcare coverage) private, public, self-pay, none, etc  [x]  Medical History  [x]  Clinical Lab tests (labs obtained after consent signed)       Time:       [x]  Fasting at least 10 hours                                                                         [x]  Fasting lipid parameters       [x]  Cardio metabolic biomarker's       [x]  HBV/HCV serology screen       [x]  FSH (females not of childbearing potential)       [x]  Pregnancy test (predose on dosing days)   [x]  Concomitant medications/therapies/ procedures  [x]  Adverse events(including documentation of pancreatitis, abdominal pain, or events requiring apheresis)SHASTA       Subject Name: Douglas [name].  Subject met inclusion and exclusion criteria.  The informed consent form, study requirements and expectations were reviewed with the subject and questions and concerns were addressed prior to the signing of the consent form.  The subject verbalized understanding of the trial requirements.  The subject agreed to  participate in the [place]  trial and signed the informed consent at 0920 on 01-27-2024.  The informed consent was obtained prior to performance of any protocol-specific procedures for the subject.  A copy of the signed informed consent was given to the subject and a copy was placed in the subject's medical record.   Dustan Hyams, [place]    Mr Neisler is here for screening for [facility].He reports going to the ED 01-21-2024 for an injury to his left ankle. States he has not taken his BP med this morning. Dr Morris did exam. Informed him I will call him with lab results to see if we can move forward in the study. Voices understanding. Mr Deroche was placed in a quit room, given time to read over the consent, and ask questions. Mr Depoy also states he takes Humalog 10 units 3 times day, Lisinopril  10 mg daily,  Fenofibrate  160 mg daily, and Coreg CR 10 mg daily (see below for other meds)   Current Outpatient Medications:    atorvastatin  (LIPITOR) 80 MG tablet, Take 1 tablet (80 mg total) by mouth daily at 6 PM., Disp: 30 tablet, Rfl: 0   ibuprofen  (ADVIL ) 600 MG tablet, Take 1 tablet (600 mg total) by mouth every 6 (six) hours as needed., Disp: 30 tablet, Rfl: 0   insulin  glargine (LANTUS ) 100 UNIT/ML Solostar Pen, Inject 30-40 Units into the skin daily., Disp: , Rfl:    metFORMIN  (GLUCOPHAGE ) 500 MG tablet, Take 3 tablets (1,500 mg total) by mouth daily., Disp: , Rfl:    ondansetron  (ZOFRAN ) 4 MG tablet, Take 1 tablet (4 mg total) by mouth every 6 (six) hours as needed for nausea., Disp: 20 tablet, Rfl: 0   oxyCODONE -acetaminophen  (PERCOCET/ROXICET) 5-325 MG tablet, Take 1 tablet by mouth every 6 (six) hours as needed for severe pain (pain score 7-10)., Disp: 12 tablet, Rfl: 0

## 2024-01-27 NOTE — Progress Notes (Signed)
 Patient here for screening for [facility].  He has a history of prior episodes of pancreatitis, and elevated triglycerides.  Also has Type II DM with elevated A1c on insulin  with poor control.  Current control unknown.  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  He has obesity on weight management, hyperlipidemia in addition, and had admission for DKA in the past, most recently 26 weeks earlier in a mild form at [facility].    PMH  Pancreatitis  Type II Diabetes on Insulin   Hypercholesterolemia  Hypertension previously followed in [place] Abnormal ECG  R Shoulder surgery 2023   Tonsillectomy Low normal EF with mild LVH 2025   Meds Atorvastatin  80mg  day Lantus  Insulin  bid sliding scale Metformin  500mg  3x/day Fibrates  reviewing dose  Alert, oriented male in NAD VS Lungs clear Cor reg Abd soft Left foot in boot  (Sprain from football) Ext no edema R with pulses intact Neuro non focal      ECG  NSR Minimal Voltage for LVH ST and T wave abnormality No real prior tracings.  He has one strip with 3 leads and prominent voltage some ST and T change.  Asymptomatic and long standing hypertension  Impression  Long standing hypertension Type II DM  Prior pancreatitis (trig >5k)  Recent DKA  (26 weeks) History of elevated A1C  Purpose of study, entry criteria, randomization reviewed with patient.  Screening labs for study obtained today.  Will review again once these have been obtained.  He wishes to proceed.   Debby BIRCH. Morris, MD, [facility] Medical Director, [facility]       Plan   Screening labs

## 2024-01-31 ENCOUNTER — Encounter: Payer: Self-pay | Admitting: *Deleted

## 2024-01-31 DIAGNOSIS — Z006 Encounter for examination for normal comparison and control in clinical research program: Secondary | ICD-10-CM

## 2024-01-31 NOTE — Research (Addendum)
 Spoke with Mr Ang about his labs work. Informed him of his lab results and that I will be sending his labs to [facility] to review.  We can bring him back in for re screening. He will need to follow up with his PCP related to his A1c. He states he is not having any muscle pain, or abd pain.  Fax sent to [facility] with lab results attached.

## 2024-01-31 NOTE — Research (Cosign Needed Addendum)
 Douglas [name] Screening Douglas [name] 27-Jan-2024            Chemistry: Creatine Kinase (CK) 627 U/L                                  [x] Clinically Significant  [] Not Clinically Significant  Total Protein 8.7 g/dL                                                [] Clinically Significant  [x] Not Clinically Significant Hemoglobin A1c 11.8                                                [x] Clinically Significant  [] Not Clinically Significant   Urinalysis: Glucose >1000   mg/dL                                              [] Clinically Significant  [x] Not Clinically Significant Specific Gravity 1.036                                                [] Clinically Significant  [x] Not Clinically Significant Blood 0.03 mg/dL                                                       [] Clinically Significant  [x] Not Clinically Significant Protein 100 mg/dL                                                      [] Clinically Significant  [x] Not Clinically Significant Bacteria 1+                                                                 [] Clinically Significant  [x] Not Clinically Significant  Urine Chemistry: Urine Protein 99 mg/dL                                               [] Clinically Significant  [x] Not Clinically Significant Protein Creatinine Ratio  439 mg/g                             [] Clinically Significant  [x] Not Clinically Significant  Lipids:  Triglyceride  956 mg/dL                                                 []   Clinically Significant  [x] Not Clinically Significant Total Cholesterol 396 mg/dL                                         [] Clinically Significant  [x] Not Clinically Significant Non-HDL Cholesterol 357 mg/dL                                  [] Clinically Significant  [x] Not Clinically Significant Apolipoprotein Clll 52.93 mg/dL                                    [] Clinically Significant  [x] Not Clinically Significant Apolipoprotein B 174.00 mg/dL                                     [] Clinically Significant  [x] Not Clinically Significant  Coagulation Fibrinogen 428 mg/dL                                                   [] Clinically Significant  [x] Not Clinically Significant    We can bring him back in later for a re- screen. A1c to high at present Any further action needed to be taken per the PI?  Yes, PCP needs to see about high A1c - also, CK elevated - history of this?    Looking back he has had hx of elevated CK in 2015 was 977. Also 745. A few years ago, I can't fine any recent from [place].  I will send all labs to PCP. Maybe bring him back in after he sees his PCP and/if meds changed.  _______________________________________________________________ Douglas [name] If he is not having muscle pain - would not make medication changes  Douglas [name] Douglas Maxcy, MD, [facility], FNLA, FACP  [place]  [facility]  Medical Director of the Advanced Lipid Disorders &  Cardiovascular Risk Reduction Clinic Diplomate of the American Board of Clinical Lipidology Attending Cardiologist  Direct Dial: [phone]  Fax: [phone]  Website:  [email or web address]
